# Patient Record
Sex: Male | Born: 1960 | Race: White | Hispanic: No | Marital: Married | Smoking: Former smoker
Health system: Southern US, Community
[De-identification: ages and names within clinical notes are randomized; demographics above are authoritative.]

## PROBLEM LIST (undated history)

## (undated) DIAGNOSIS — E785 Hyperlipidemia, unspecified: Secondary | ICD-10-CM

## (undated) DIAGNOSIS — K219 Gastro-esophageal reflux disease without esophagitis: Secondary | ICD-10-CM

## (undated) DIAGNOSIS — I10 Essential (primary) hypertension: Secondary | ICD-10-CM

## (undated) DIAGNOSIS — I2510 Atherosclerotic heart disease of native coronary artery without angina pectoris: Secondary | ICD-10-CM

## (undated) HISTORY — DX: Essential (primary) hypertension: I10

## (undated) HISTORY — DX: Gastro-esophageal reflux disease without esophagitis: K21.9

## (undated) HISTORY — DX: Hyperlipidemia, unspecified: E78.5

## (undated) HISTORY — DX: Atherosclerotic heart disease of native coronary artery without angina pectoris: I25.10

---

## 1998-09-13 HISTORY — PX: VASECTOMY: SHX75

## 2005-07-19 DIAGNOSIS — M545 Low back pain, unspecified: Secondary | ICD-10-CM | POA: Insufficient documentation

## 2010-01-23 DIAGNOSIS — R5381 Other malaise: Secondary | ICD-10-CM | POA: Insufficient documentation

## 2014-05-17 ENCOUNTER — Ambulatory Visit: Payer: Self-pay | Admitting: Obstetrics and Gynecology

## 2014-05-27 ENCOUNTER — Ambulatory Visit: Payer: Self-pay | Admitting: Family Medicine

## 2014-07-04 ENCOUNTER — Ambulatory Visit: Payer: Self-pay | Admitting: Family Medicine

## 2014-07-15 ENCOUNTER — Ambulatory Visit: Payer: Self-pay | Admitting: Family Medicine

## 2014-09-17 ENCOUNTER — Ambulatory Visit: Payer: Managed Care, Other (non HMO) | Admitting: Cardiovascular Disease

## 2014-09-17 ENCOUNTER — Ambulatory Visit (INDEPENDENT_AMBULATORY_CARE_PROVIDER_SITE_OTHER): Payer: Managed Care, Other (non HMO) | Admitting: Cardiovascular Disease

## 2014-09-17 ENCOUNTER — Encounter: Payer: Self-pay | Admitting: Cardiovascular Disease

## 2014-09-17 VITALS — BP 152/106 | HR 70 | Ht 69.0 in | Wt 206.0 lb

## 2014-09-17 DIAGNOSIS — Z720 Tobacco use: Secondary | ICD-10-CM

## 2014-09-17 DIAGNOSIS — I209 Angina pectoris, unspecified: Secondary | ICD-10-CM

## 2014-09-17 DIAGNOSIS — R0789 Other chest pain: Secondary | ICD-10-CM

## 2014-09-17 DIAGNOSIS — M549 Dorsalgia, unspecified: Secondary | ICD-10-CM | POA: Insufficient documentation

## 2014-09-17 DIAGNOSIS — I159 Secondary hypertension, unspecified: Secondary | ICD-10-CM

## 2014-09-17 DIAGNOSIS — I10 Essential (primary) hypertension: Secondary | ICD-10-CM

## 2014-09-17 DIAGNOSIS — Z87891 Personal history of nicotine dependence: Secondary | ICD-10-CM | POA: Insufficient documentation

## 2014-09-17 NOTE — Assessment & Plan Note (Signed)
Blood pressures running high. He has not started his metoprolol succinate. Heart rate running in the 60s. Suggested he start one half pill initially, buy a blood pressure cuff and monitor his blood pressure at home. To avoid bradycardia, may need alternate medication such as amlodipine, losartan or lisinopril, possibly even a combination pill with HCTZ. Suggested he call our office back in one week time with blood pressure measurements for further medication adjustment.

## 2014-09-17 NOTE — Assessment & Plan Note (Signed)
He stopped smoking many years ago. Only a remote smoking history

## 2014-09-17 NOTE — Assessment & Plan Note (Signed)
He reports symptoms have significantly improved.

## 2014-09-17 NOTE — Patient Instructions (Addendum)
[facility] MYOVIEW  Your caregiver has ordered a Stress Test with nuclear imaging. The purpose of this test is to evaluate the blood supply to your heart muscle. This procedure is referred to as a "Non-Invasive Stress Test." This is because other than having an IV started in your vein, nothing is inserted or "invades" your body. Cardiac stress tests are done to find areas of poor blood flow to the heart by determining the extent of coronary artery disease (CAD). Some patients exercise on a treadmill, which naturally increases the blood flow to your heart, while others who are  unable to walk on a treadmill due to physical limitations have a pharmacologic/chemical stress agent called Lexiscan . This medicine will mimic walking on a treadmill by temporarily increasing your coronary blood flow.   Please note: these test may take anywhere between 2-4 hours to complete  PLEASE REPORT TO [facility] : [facility]  THE VOLUNTEERS AT THE FIRST DESK WILL DIRECT YOU WHERE TO GO  Date of Procedure:________Friday, January 22_______  Arrival Time for Procedure:______10:15 am___________  Instructions regarding medication:   __X__:  Hold betablocker(s) night before procedure and morning of procedure:  METOPROLOL   PLEASE NOTIFY THE OFFICE AT LEAST 24 HOURS IN ADVANCE IF YOU ARE UNABLE TO KEEP YOUR APPOINTMENT.  [phone] AND  PLEASE NOTIFY [facility] AT [facility] AT LEAST 24 HOURS IN ADVANCE IF YOU ARE UNABLE TO KEEP YOUR APPOINTMENT. [phone]  How to prepare for your Myoview test:  1. Do not eat or drink after midnight 2. No caffeine for 24 hours prior to test 3. No smoking 24 hours prior to test. 4. Your medication may be taken with water.  If your doctor stopped a medication because of this test, do not take that medication. 5. Ladies, please do not wear dresses.  Skirts or pants are appropriate. Please wear a short sleeve shirt. 6. No perfume, cologne or lotion. 7. Wear comfortable walking  shoes. No heels!

## 2014-09-17 NOTE — Procedures (Signed)
Treadmill ordered for recent episodes of chest pain.  Resting EKG shows NSR with rate of 78 bpm, nonspecific ST abnormality in the anterolateral leads Resting blood pressure of 170/114 Stand bruce protocal was used.  Patient exercised for 9 min 29 sec,  Peak heart rate of 153 bpm.  This was 91% of the maximum predicted heart rate (167 bpm) Achieved 10.8 METS No symptoms of chest pain or lightheadedness were reported at peak stress or in recovery.  He did have chest pain at stage II Peak Blood pressure recorded was 191/116. Heart rate at 3 minutes in recovery was 95 bpm.  FINAL IMPRESSION: Equivocal ST changes inferior leads, anterolateral leads  Excellent exercise tolerance. He did develop chest pain in stage II, throughout the test concerning for angina. Severe hypertension at rest and through the stress test Given these findings, treadmill Myoview has been ordered

## 2014-09-17 NOTE — Patient Instructions (Signed)
You are doing well. Blood pressure is high today  We will schedule routine treadmill study for chest pain  Please buy a blood pressure cuff and track blood pressure at home Start metoprolol 1/2 pill daily  Please call us if you have new issues that need to be addressed before your next appt.  Your physician wants you to follow-up in: 1 month

## 2014-09-17 NOTE — Progress Notes (Signed)
Patient ID: Jeff ChapelMark R Fields, male    DOB: 12/04/1960, 54 y.o.   MRN: [id number]  HPI Comments: Jeff Fields is a 54 year old gentleman with remote smoking history who presents by referral from Dr. Sullivan LoneGilbert for chest pain on exertion.  He reports that he has had significant stress recently. Father diagnosed with testicular cancer requiring treatment. He found a lump on his testicle and after significant workup, this was found to be a benign finding Previously never had blood pressure issues, now blood pressure has been running high in the doctor's offices. He does not have a blood pressure cuff himself and does not know what his blood pressure runs at home.  Recently developed low back pain. This seems to have improved somewhat  Over the past several weeks, he has developed chest pain. He has pain onset with exertion, typically lasting 30 seconds. With rest, symptoms resolve. Denies having any symptoms typically at rest. Because of this he has not been working out as he typically does. Weight has been trending upwards He attributes the blood pressure rise on his weight gain No PND or orthopnea  EKG on today's visit shows normal sinus rhythm with rate 74 bpm, nonspecific ST abnormality   No Known Allergies  Outpatient Encounter Prescriptions as of 09/17/2014  Medication Sig  . aspirin 81 MG tablet Take 81 mg by mouth daily.  . metoprolol succinate (TOPROL-XL) 50 MG 24 hr tablet Take 50 mg by mouth daily. Take with or immediately following a meal.    Past Medical History  Diagnosis Date  . Hypertension     History reviewed. No pertinent past surgical history.  Social History  reports that he has never smoked. He does not have any smokeless tobacco history on file. He reports that he drinks alcohol. He reports that he does not use illicit drugs.  Family History family history includes Arrhythmia in his father; Hypertension in his brother and father.  Father recently diagnosed with testicular  cancer   Review of Systems  Constitutional: Negative.   Respiratory: Positive for chest tightness.   Cardiovascular: Positive for chest pain.  Gastrointestinal: Negative.   Musculoskeletal: Negative.   Neurological: Negative.   Hematological: Negative.   Psychiatric/Behavioral: Negative.     BP 152/106 mmHg  Pulse 70  Ht 5\' 9"  (1.753 m)  Wt 206 lb (93.441 kg)  BMI 30.41 kg/m2  Physical Exam  Constitutional: He is oriented to person, place, and time. He appears well-developed and well-nourished.  HENT:  Head: Normocephalic.  Nose: Nose normal.  Mouth/Throat: Oropharynx is clear and moist.  Eyes: Conjunctivae are normal. Pupils are equal, round, and reactive to light.  Neck: Normal range of motion. Neck supple. No JVD present.  Cardiovascular: Normal rate, regular rhythm, S1 normal, S2 normal, normal heart sounds and intact distal pulses.  Exam reveals no gallop and no friction rub.   No murmur heard. Pulmonary/Chest: Effort normal and breath sounds normal. No respiratory distress. He has no wheezes. He has no rales. He exhibits no tenderness.  Abdominal: Soft. Bowel sounds are normal. He exhibits no distension. There is no tenderness.  Musculoskeletal: Normal range of motion. He exhibits no edema or tenderness.  Lymphadenopathy:    He has no cervical adenopathy.  Neurological: He is alert and oriented to person, place, and time. Coordination normal.  Skin: Skin is warm and dry. No rash noted. No erythema.  Psychiatric: He has a normal mood and affect. His behavior is normal. Judgment and thought content normal.  Assessment and Plan   Nursing note and vitals reviewed.

## 2014-09-17 NOTE — Assessment & Plan Note (Signed)
Etiology of his chest pain is unclear. He has few cardiac risk factors, no family history. Only remote smoking history. Symptoms are concerning given his chest pain with exertion.  Uncertain if this is secondary to severe hypertension. Routine treadmill study has been ordered in the office today. If this shows abnormal EKG with exertion or if he has chest pain, we would order a Myoview.

## 2014-09-20 LAB — LIPID PANEL
CHOLESTEROL: 191 mg/dL (ref 0–200)
HDL: 31 mg/dL — AB (ref 35–70)
Triglycerides: 148 mg/dL (ref 40–160)

## 2014-09-20 LAB — BASIC METABOLIC PANEL
BUN: 14 mg/dL (ref 4–21)
CREATININE: 1.4 mg/dL — AB (ref 0.6–1.3)
GLUCOSE: 111 mg/dL

## 2014-09-21 ENCOUNTER — Encounter: Payer: Self-pay | Admitting: Cardiovascular Disease

## 2014-09-25 ENCOUNTER — Telehealth: Payer: Self-pay

## 2014-09-25 NOTE — Telephone Encounter (Signed)
Pt states his BP is not dropping any with the medication he is on. Please call and advise if he needs a med Change.

## 2014-09-25 NOTE — Telephone Encounter (Signed)
Pt sent MyChart message w/ recent BP readings.  He is sched to see Dr. Mariah MillingGollan on 10/25/14 for 1 month f/u, but would like to know if there are any changes that can be made before then.

## 2014-09-27 NOTE — Telephone Encounter (Signed)
Ideally need blood pressure measurements Would start amlodipine 10 mg daily Continue to monitor blood pressure with home blood pressure reading

## 2014-09-30 MED ORDER — AMLODIPINE BESYLATE 10 MG PO TABS: 10.0000 mg | ORAL_TABLET | Freq: Every day | ORAL | Status: DC

## 2014-09-30 NOTE — Telephone Encounter (Signed)
Pt sent message via MyChart notifying him of Dr. Windell HummingbirdGollan's recommendation.  Asked him to call back w/ any questions or concerns.

## 2014-10-25 ENCOUNTER — Telehealth: Payer: Self-pay

## 2014-10-25 ENCOUNTER — Ambulatory Visit: Payer: Managed Care, Other (non HMO) | Admitting: Cardiovascular Disease

## 2014-10-25 ENCOUNTER — Ambulatory Visit: Payer: Self-pay | Admitting: Cardiovascular Disease

## 2014-10-25 DIAGNOSIS — R079 Chest pain, unspecified: Secondary | ICD-10-CM

## 2014-10-25 NOTE — Telephone Encounter (Signed)
Pt called back, states he does want to proceed with the cath on the 26th. This encounter was created in error - please disregard.

## 2014-10-25 NOTE — Telephone Encounter (Signed)
Pt would like to tentatively sched cath for 11/08/14, but keep appt on 2/22 to discuss w/ Dr. Mariah MillingGollan. Advised him that we can get precath labs at that time.

## 2014-10-25 NOTE — Telephone Encounter (Signed)
Spoke w/ pt.  Advised him that Dr. Kirke CorinArida read his Celine Ahrmyoview and it was found to be abnormal.  Per Dr. Mariah MillingGollan, try to sched cath for 2/26.  Advised pt that I have limited information to provide him at this time, as the results are not yet in our system.  Pt has many questions regarding the results, what the cath entails and what other possible steps would be, as he states that his chest pain has subsided. Pt is sched to see Dr. Mariah MillingGollan on 11/04/14 and would feel more comfortable keeping to appt to discuss before scheduling cath.  Asked pt to call back w/ any further questions or concerns, or if he changes his mind and would like to go ahead and schedule cath.

## 2014-10-28 ENCOUNTER — Other Ambulatory Visit: Payer: Self-pay

## 2014-10-28 DIAGNOSIS — R0789 Other chest pain: Secondary | ICD-10-CM

## 2014-10-28 DIAGNOSIS — I159 Secondary hypertension, unspecified: Secondary | ICD-10-CM

## 2014-11-04 ENCOUNTER — Encounter: Payer: Self-pay | Admitting: Cardiovascular Disease

## 2014-11-04 ENCOUNTER — Ambulatory Visit (INDEPENDENT_AMBULATORY_CARE_PROVIDER_SITE_OTHER): Payer: Managed Care, Other (non HMO) | Admitting: Cardiovascular Disease

## 2014-11-04 VITALS — BP 128/80 | HR 72 | Ht 69.0 in | Wt 192.2 lb

## 2014-11-04 DIAGNOSIS — Z01810 Encounter for preprocedural cardiovascular examination: Secondary | ICD-10-CM

## 2014-11-04 DIAGNOSIS — I10 Essential (primary) hypertension: Secondary | ICD-10-CM

## 2014-11-04 DIAGNOSIS — Z720 Tobacco use: Secondary | ICD-10-CM

## 2014-11-04 DIAGNOSIS — Z87891 Personal history of nicotine dependence: Secondary | ICD-10-CM

## 2014-11-04 DIAGNOSIS — I209 Angina pectoris, unspecified: Secondary | ICD-10-CM

## 2014-11-04 NOTE — Assessment & Plan Note (Signed)
We have encouraged him to continue to work on weaning his cigarettes and smoking cessation. He will continue to work on this and does not want any assistance with chantix.  

## 2014-11-04 NOTE — Assessment & Plan Note (Signed)
We will schedule him for cardiac catheterization 11/08/2014. Risk and benefit discussed with him including significant bruising, heart attack, stroke. Cardiac stress test images shown to him No medication changes at this time

## 2014-11-04 NOTE — Patient Instructions (Addendum)
You are doing well. No medication changes were made.  Please call us if you have new issues that need to be addressed before your next appt.  Your physician wants you to follow-up in: 3 weeks      [facility] Cardiac Cath Instructions   You are scheduled for a Cardiac Cath on:_____Friday, Feb 26_________  Please arrive at __7:30 am__ on the day of your procedure  You will need to pre-register prior to the day of your procedure.  [facility].  Registration is the first desk on your right.  Please take the procedure order we have given you in order to be registered appropriately  Do not eat/drink anything after midnight  Someone will need to drive you home  It is recommended someone be with you for the first 24 hours after your procedure  Wear clothes that are easy to get on/off and wear slip on shoes if possible   Medications bring a current list of all medications with you  ___ You may take all of your medications the morning of your procedure with enough water to swallow safely  ___ Do not take these medications before your procedure:__________________________________________   Day of your procedure: Arrive at the Medical Mall entrance.  Free valet service is available.  After entering the Medical Mall please check-in at the registration desk (1st desk on your right) to receive your armband. After receiving your armband someone will escort you to the cardiac cath/special procedures waiting area.  The usual length of stay after your procedure is about 2 to 3 hours.  This can vary.  If you have any questions, please call our office at [phone], or you may call the cardiac cath lab at [facility] directly at [phone]

## 2014-11-04 NOTE — Progress Notes (Signed)
   Patient ID: Jeff Fields, male    DOB: 10/08/1960, 54 y.o.   MRN: [id number]  HPI Comments:  [name] is a 54 year old gentleman with remote smoking history, patient of Dr. Sullivan LoneGilbert, previously presenting  for chest pain on exertion, who follows up after recent stress test.  His stress test showed anterior wall ischemia seen in both non-attenuation corrected and attenuation corrected images. Wall motion abnormality to the anterior and anteroseptal wall  Images were shown to him in detail. Region of ischemia detail to him. He is interested in cardiac catheterization. He continues to have rare episodes of chest pain Previous note documented chest pain with exertion, relieved at rest  EKG dated the fourth 2016 showing no significant ST or T-wave changes  Other recent stressors  Father diagnosed with testicular cancer requiring treatment. Patient found a lump on his testicle and after significant workup, this was found to be a benign finding Recently developed low back pain.       No Known Allergies  Outpatient Encounter Prescriptions as of 11/04/2014  Medication Sig  . amLODipine (NORVASC) 10 MG tablet Take 1 tablet (10 mg total) by mouth daily.  Marland Kitchen. aspirin 81 MG tablet Take 81 mg by mouth daily.  . [DISCONTINUED] metoprolol succinate (TOPROL-XL) 50 MG 24 hr tablet Take 50 mg by mouth daily. Take with or immediately following a meal.    Past Medical History  Diagnosis Date  . Hypertension     History reviewed. No pertinent past surgical history.  Social History  reports that he has never smoked. He does not have any smokeless tobacco history on file. He reports that he drinks alcohol. He reports that he does not use illicit drugs.  Family History family history includes Arrhythmia in his father; Hypertension in his brother and father.   Review of Systems  Constitutional: Negative.   Respiratory: Negative.   Cardiovascular: Negative.   Gastrointestinal: Negative.    Musculoskeletal: Negative.   Skin: Negative.   Neurological: Negative.   Hematological: Negative.   Psychiatric/Behavioral: Negative.   All other systems reviewed and are negative.   BP 128/80 mmHg  Pulse 72  Ht 5\' 9"  (1.753 m)  Wt 192 lb 4 oz (87.204 kg)  BMI 28.38 kg/m2  Physical Exam  Constitutional: He is oriented to person, place, and time. He appears well-developed and well-nourished.  HENT:  Head: Normocephalic.  Nose: Nose normal.  Mouth/Throat: Oropharynx is clear and moist.  Eyes: Conjunctivae are normal. Pupils are equal, round, and reactive to light.  Neck: Normal range of motion. Neck supple. No JVD present.  Cardiovascular: Normal rate, regular rhythm, S1 normal, S2 normal, normal heart sounds and intact distal pulses.  Exam reveals no gallop and no friction rub.   No murmur heard. Pulmonary/Chest: Effort normal and breath sounds normal. No respiratory distress. He has no wheezes. He has no rales. He exhibits no tenderness.  Abdominal: Soft. Bowel sounds are normal. He exhibits no distension. There is no tenderness.  Musculoskeletal: Normal range of motion. He exhibits no edema or tenderness.  Lymphadenopathy:    He has no cervical adenopathy.  Neurological: He is alert and oriented to person, place, and time. Coordination normal.  Skin: Skin is warm and dry. No rash noted. No erythema.  Psychiatric: He has a normal mood and affect. His behavior is normal. Judgment and thought content normal.      Assessment and Plan   Nursing note and vitals reviewed.

## 2014-11-04 NOTE — Assessment & Plan Note (Signed)
Blood pressure is well controlled on today's visit. No changes made to the medications. 

## 2014-11-05 LAB — CBC WITH DIFFERENTIAL/PLATELET
Basophils Absolute: 0 10*3/uL (ref 0.0–0.2)
Basos: 0 %
EOS: 2 %
Eosinophils Absolute: 0.1 10*3/uL (ref 0.0–0.4)
HCT: 41.1 % (ref 37.5–51.0)
Hemoglobin: 14.3 g/dL (ref 12.6–17.7)
IMMATURE GRANS (ABS): 0 10*3/uL (ref 0.0–0.1)
IMMATURE GRANULOCYTES: 0 %
Lymphocytes Absolute: 1.7 10*3/uL (ref 0.7–3.1)
Lymphs: 35 %
MCH: 31.7 pg (ref 26.6–33.0)
MCHC: 34.8 g/dL (ref 31.5–35.7)
MCV: 91 fL (ref 79–97)
MONOCYTES: 8 %
Monocytes Absolute: 0.4 10*3/uL (ref 0.1–0.9)
NEUTROS PCT: 55 %
Neutrophils Absolute: 2.6 10*3/uL (ref 1.4–7.0)
Platelets: 239 10*3/uL (ref 150–379)
RBC: 4.51 x10E6/uL (ref 4.14–5.80)
RDW: 13.6 % (ref 12.3–15.4)
WBC: 4.8 10*3/uL (ref 3.4–10.8)

## 2014-11-05 LAB — BASIC METABOLIC PANEL
BUN / CREAT RATIO: 15 (ref 9–20)
BUN: 18 mg/dL (ref 6–24)
CO2: 22 mmol/L (ref 18–29)
Calcium: 9.5 mg/dL (ref 8.7–10.2)
Chloride: 104 mmol/L (ref 97–108)
Creatinine, Ser: 1.17 mg/dL (ref 0.76–1.27)
GFR calc non Af Amer: 71 mL/min/{1.73_m2} (ref >59)
GFR, EST AFRICAN AMERICAN: 82 mL/min/{1.73_m2} (ref >59)
Glucose: 94 mg/dL (ref 65–99)
POTASSIUM: 5 mmol/L (ref 3.5–5.2)
Sodium: 141 mmol/L (ref 134–144)

## 2014-11-05 LAB — PROTIME-INR
INR: 1.1 (ref 0.8–1.2)
Prothrombin Time: 11.2 s (ref 9.1–12.0)

## 2014-11-06 ENCOUNTER — Telehealth: Payer: Self-pay | Admitting: Cardiovascular Disease

## 2014-11-06 ENCOUNTER — Ambulatory Visit: Payer: Self-pay | Admitting: Cardiovascular Disease

## 2014-11-06 NOTE — Telephone Encounter (Signed)
His paperwork is on your desk.  He would like this completed before his cath on Friday.

## 2014-11-06 NOTE — Telephone Encounter (Signed)
Patient needs FMLA papers by Friday morning.  Please DO NOT SEND TO [place].    Please call patient when ready.

## 2014-11-07 NOTE — Telephone Encounter (Signed)
Spoke w/ pt.  Advised him that paperwork would need to be completed after his cath on Friday.  He is agreeable to this an appreciative.

## 2014-11-08 ENCOUNTER — Other Ambulatory Visit: Payer: Self-pay

## 2014-11-08 ENCOUNTER — Encounter: Payer: Self-pay | Admitting: Cardiovascular Disease

## 2014-11-08 ENCOUNTER — Ambulatory Visit: Payer: Self-pay | Admitting: Cardiovascular Disease

## 2014-11-08 DIAGNOSIS — Z01810 Encounter for preprocedural cardiovascular examination: Secondary | ICD-10-CM

## 2014-11-08 DIAGNOSIS — I209 Angina pectoris, unspecified: Secondary | ICD-10-CM

## 2014-11-08 DIAGNOSIS — I2510 Atherosclerotic heart disease of native coronary artery without angina pectoris: Secondary | ICD-10-CM

## 2014-11-08 HISTORY — PX: CORONARY ANGIOPLASTY WITH STENT PLACEMENT: SHX49

## 2014-11-08 HISTORY — PX: CARDIAC CATHETERIZATION: SHX172

## 2014-11-09 ENCOUNTER — Other Ambulatory Visit: Payer: Self-pay | Admitting: Cardiovascular Disease

## 2014-11-09 DIAGNOSIS — R079 Chest pain, unspecified: Secondary | ICD-10-CM

## 2014-11-09 MED ORDER — CLOPIDOGREL BISULFATE 75 MG PO TABS: 75.0000 mg | ORAL_TABLET | Freq: Every day | ORAL | Status: DC

## 2014-11-09 MED ORDER — ATORVASTATIN CALCIUM 40 MG PO TABS: 40.0000 mg | ORAL_TABLET | Freq: Every day | ORAL | Status: DC

## 2014-11-09 MED ORDER — METOPROLOL SUCCINATE ER 25 MG PO TB24: 25.0000 mg | ORAL_TABLET | Freq: Every day | ORAL | Status: DC

## 2014-11-11 ENCOUNTER — Encounter: Payer: Self-pay | Admitting: Cardiovascular Disease

## 2014-11-11 ENCOUNTER — Telehealth: Payer: Self-pay | Admitting: Cardiovascular Disease

## 2014-11-11 NOTE — Telephone Encounter (Signed)
Pt is calling reminding us to do the FLMA today   Please fax to : Tessie [employer] [phone]  [facility]

## 2014-11-11 NOTE — Telephone Encounter (Signed)
Pt is calling again, knows we are busy and would like us to know payroll is being done this morning, if we could sent in the paper. Please call patient when done.

## 2014-11-11 NOTE — Telephone Encounter (Signed)
Spoke w/ pt.  Advised him that completed paperwork has been faxed.  He is appreciative and will call back w/ any questions or concerns.

## 2014-11-12 ENCOUNTER — Telehealth: Payer: Self-pay | Admitting: *Deleted

## 2014-11-12 NOTE — Telephone Encounter (Signed)
Pt needs a letter for work when he was out  For Friday 11/08/14 and Monday 11/11/14 and today 11/12/14  Patient needs this done today, for he is going to work tmrw.  He can come by and pick this up.   Also in letter can we put that it is "light duty"  He wants to know how long he should be on that.  Please advise.

## 2014-11-12 NOTE — Telephone Encounter (Signed)
Okay to write a work note At this point he does not need light duty, can resume full duty unless he would like to do light duty through the end of this week, regular duty next week That is okay

## 2014-11-12 NOTE — Telephone Encounter (Signed)
Spoke w/ pt.  Advised him that I am leaving letter at the front desk for him to pick up at his convenience, along w/ the originals of the [facility] paperwork that we faxed for him. He is appreciative and will call back w/ any questions or concerns.

## 2014-11-13 NOTE — Telephone Encounter (Signed)
This encounter was created in error - please disregard.

## 2014-11-25 ENCOUNTER — Telehealth: Payer: Self-pay | Admitting: *Deleted

## 2014-11-25 NOTE — Telephone Encounter (Signed)
Patient can't tolerate the atorvastatin causes muscle and joint aches. Please advise what other medication he could take.

## 2014-11-25 NOTE — Telephone Encounter (Signed)
Pt called having question On his cholesterol medication He can not take it anymore for it Enhances his arthritis.  Did not take anything yesterday  Would like a generic version or something else to replace it. Please call patient and let him know.  And if we could send it in to [address] [facility],

## 2014-11-26 NOTE — Telephone Encounter (Signed)
Generic of Lipitor is atorvastatin. Sometimes different pharmacy will provide a better price. He is welcome to try a different generic such as simvastatin 40 mg daily Would start one half pill for several weeks before titrating upwards Repeat cholesterol in 3 months time Goal LDL less than 70

## 2014-11-27 ENCOUNTER — Ambulatory Visit (INDEPENDENT_AMBULATORY_CARE_PROVIDER_SITE_OTHER): Payer: Managed Care, Other (non HMO) | Admitting: Cardiovascular Disease

## 2014-11-27 ENCOUNTER — Encounter: Payer: Self-pay | Admitting: Cardiovascular Disease

## 2014-11-27 ENCOUNTER — Ambulatory Visit: Payer: Managed Care, Other (non HMO) | Admitting: Cardiovascular Disease

## 2014-11-27 VITALS — BP 112/78 | HR 61 | Ht 69.0 in | Wt 185.5 lb

## 2014-11-27 DIAGNOSIS — Z720 Tobacco use: Secondary | ICD-10-CM

## 2014-11-27 DIAGNOSIS — I10 Essential (primary) hypertension: Secondary | ICD-10-CM

## 2014-11-27 DIAGNOSIS — E785 Hyperlipidemia, unspecified: Secondary | ICD-10-CM | POA: Insufficient documentation

## 2014-11-27 DIAGNOSIS — Z955 Presence of coronary angioplasty implant and graft: Secondary | ICD-10-CM | POA: Insufficient documentation

## 2014-11-27 DIAGNOSIS — I2510 Atherosclerotic heart disease of native coronary artery without angina pectoris: Secondary | ICD-10-CM | POA: Insufficient documentation

## 2014-11-27 DIAGNOSIS — I25119 Atherosclerotic heart disease of native coronary artery with unspecified angina pectoris: Secondary | ICD-10-CM

## 2014-11-27 DIAGNOSIS — Z87891 Personal history of nicotine dependence: Secondary | ICD-10-CM

## 2014-11-27 MED ORDER — CLOPIDOGREL BISULFATE 75 MG PO TABS: 75.0000 mg | ORAL_TABLET | Freq: Every day | ORAL | Status: DC

## 2014-11-27 MED ORDER — LOVASTATIN 20 MG PO TABS: 20.0000 mg | ORAL_TABLET | Freq: Every day | ORAL | Status: DC

## 2014-11-27 MED ORDER — METOPROLOL TARTRATE 25 MG PO TABS: 25.0000 mg | ORAL_TABLET | Freq: Two times a day (BID) | ORAL | Status: DC

## 2014-11-27 MED ORDER — AMLODIPINE BESYLATE 10 MG PO TABS: 10.0000 mg | ORAL_TABLET | Freq: Every day | ORAL | Status: DC

## 2014-11-27 NOTE — Patient Instructions (Signed)
You are doing well.  Please cut the amlodipine in 1/2 daily (5 mg daily)  Continue to hold the atorvastatin Try Co Enz Q10 for muscle ache  Try lovastatin 1/2 pill once a day for cholesterol Wait a few weeks, if no side effects, increase up to a full pill  We will change the metoprolol succinate XL to metoprolol tartrate (twice a day pill), 25 mg twice a day Monitor your heart rate,  if it runs low (always in the 50s), cut the metoprolol in 1/2 twice a day  Continue asa and plavix  Please call us if you have new issues that need to be addressed before your next appt.  Your physician wants you to follow-up in: 6 months.  You will receive a reminder letter in the mail two months in advance. If you don't receive a letter, please call our office to schedule the follow-up appointment.

## 2014-11-27 NOTE — Assessment & Plan Note (Signed)
In addition to the LAD lesion, there was 40% proximal LAD disease, 40% diagonal disease We'll continue aggressive cholesterol management, smoking cessation.

## 2014-11-27 NOTE — Telephone Encounter (Signed)
Left message for pt to call back  °

## 2014-11-27 NOTE — Assessment & Plan Note (Signed)
Cardiac catheterization showing 99% mid LAD disease after the diagonal branch. Xience 3.50 x 18 mm stent placed, DES We'll continue aspirin and Plavix

## 2014-11-27 NOTE — Progress Notes (Signed)
Patient ID: Jeff Fields, male    DOB: 1960-10-03, 54 y.o.   MRN: [id number]  HPI Comments:  Jeff Fields is a 54 year old gentleman with remote smoking history, patient of Dr. Sullivan Lone, initially presenting with chest pain on exertion,   stress test that showed anterior wall ischemia, Follows up today after recent cardiac catheterization and stent placement to his mid LAD.  Since his discharge from the hospital, he has had severe muscle and arthritic pain which she attributes to the Lipitor. He has stopped the medication 3 days ago and feels somewhat better though still has symptoms. Blood pressure has been running low on amlodipine 10 mg daily and metoprolol succinate 25 mill grams daily. Heart rate 50s to 60s Denies having any further chest pain Tolerating aspirin and Plavix He wonders if he could change the metoprolol succinate to the [employer] generic  EKG on today's visit shows normal sinus rhythm with rate 61 bpm, no significant ST or T-wave changes  Past medical history His stress test showed anterior wall ischemia seen in both non-attenuation corrected and attenuation corrected images. Wall motion abnormality to the anterior and anteroseptal wall  Other recent stressors  Father diagnosed with testicular cancer requiring treatment. Patient found a lump on his testicle and after significant workup, this was found to be a benign finding Recently developed low back pain.       No Known Allergies  Outpatient Encounter Prescriptions as of 53/16/2016  Medication Sig  . amLODipine (NORVASC) 10 MG tablet Take 1 tablet (10 mg total) by mouth daily.  Marland Kitchen aspirin 81 MG tablet Take 81 mg by mouth daily.  . clopidogrel (PLAVIX) 75 MG tablet Take 1 tablet (75 mg total) by mouth daily.  . [DISCONTINUED] amLODipine (NORVASC) 10 MG tablet Take 1 tablet (10 mg total) by mouth daily.  . [DISCONTINUED] atorvastatin (LIPITOR) 40 MG tablet Take 1 tablet (40 mg total) by mouth daily.  . [DISCONTINUED]  clopidogrel (PLAVIX) 75 MG tablet Take 1 tablet (75 mg total) by mouth daily.  . [DISCONTINUED] metoprolol succinate (TOPROL XL) 25 MG 24 hr tablet Take 1 tablet (25 mg total) by mouth daily.  Marland Kitchen lovastatin (MEVACOR) 20 MG tablet Take 1 tablet (20 mg total) by mouth at bedtime.  . metoprolol tartrate (LOPRESSOR) 25 MG tablet Take 1 tablet (25 mg total) by mouth 2 (two) times daily.    Past Medical History  Diagnosis Date  . Hypertension     Past Surgical History  Procedure Laterality Date  . Cardiac catheterization  11/08/2014  . Coronary angioplasty with stent placement  11/08/2014    drug eluting stent placement to the mid LAD    Social History  reports that he has never smoked. He does not have any smokeless tobacco history on file. He reports that he drinks alcohol. He reports that he does not use illicit drugs.  Family History family history includes Arrhythmia in his father; Hypertension in his brother and father.   Review of Systems  Constitutional: Negative.   Respiratory: Negative.   Cardiovascular: Negative.   Gastrointestinal: Negative.   Musculoskeletal: Negative.   Skin: Negative.   Neurological: Negative.   Hematological: Negative.   Psychiatric/Behavioral: Negative.   All other systems reviewed and are negative.   BP 112/78 mmHg  Pulse 61  Ht  (1.753 m)  Wt 185 lb 8 oz (84.142 kg)  BMI 27.38 kg/m2  Physical Exam  Constitutional: He is oriented to person, place, and time. He appears well-developed and  well-nourished.  HENT:  Head: Normocephalic.  Nose: Nose normal.  Mouth/Throat: Oropharynx is clear and moist.  Eyes: Conjunctivae are normal. Pupils are equal, round, and reactive to light.  Neck: Normal range of motion. Neck supple. No JVD present.  Cardiovascular: Normal rate, regular rhythm, S1 normal, S2 normal, normal heart sounds and intact distal pulses.  Exam reveals no gallop and no friction rub.   No murmur heard. Pulmonary/Chest: Effort  normal and breath sounds normal. No respiratory distress. He has no wheezes. He has no rales. He exhibits no tenderness.  Abdominal: Soft. Bowel sounds are normal. He exhibits no distension. There is no tenderness.  Musculoskeletal: Normal range of motion. He exhibits no edema or tenderness.  Lymphadenopathy:    He has no cervical adenopathy.  Neurological: He is alert and oriented to person, place, and time. Coordination normal.  Skin: Skin is warm and dry. No rash noted. No erythema.  Psychiatric: He has a normal mood and affect. His behavior is normal. Judgment and thought content normal.      Assessment and Plan   Nursing note and vitals reviewed.

## 2014-11-27 NOTE — Assessment & Plan Note (Signed)
Will hold the atorvastatin, consider trying low-dose lovastatin 10 mg with titration up to 20 mg if tolerated Check cholesterol in 3 months time, approximately June 2016

## 2014-11-27 NOTE — Telephone Encounter (Signed)
Pt has appt this afternoon to see Dr. Mariah MillingGollan.

## 2014-11-27 NOTE — Assessment & Plan Note (Signed)
We have encouraged him to continue to work on weaning his cigarettes and smoking cessation. He will continue to work on this and does not want any assistance with chantix.  

## 2014-11-27 NOTE — Assessment & Plan Note (Signed)
Blood pressures running low today. We'll decrease the amlodipine down to 5 mg daily. He prefers metoprolol tartrate 25 mg twice a day If heart rate runs low suggested he decrease the dose down to 12.5 mill grams twice a day

## 2014-12-10 ENCOUNTER — Telehealth: Payer: Self-pay | Admitting: *Deleted

## 2014-12-10 NOTE — Telephone Encounter (Signed)
Pt c/o medication issue:  1. Name of Medication: not sure which one it could be.   2. How are you currently taking this medication (dosage and times per day)? Is still taking all medications  3. Are you having a reaction (difficulty breathing--STAT)? No   4. What is your medication issue? He states he is having reactions , shoulder joins and on back. He is having throbbing pains there. He did not have this before, this all came after the procedure and medication.   Pt states he is at work, so if we call him we will try and answer it.

## 2014-12-10 NOTE — Telephone Encounter (Signed)
Spoke w/ pt.  He reports that he is still taking 1/2 lovastatin, but that his muscle pain never went away from the atorvastatin.  Discussed w/ pt that if sx were r/t meds, we would have him hold med until sx improved and if they don't improve, they most likely were not r/t med.  He states that his "friend had muscle pain from his blood pressure medicine.  I don't know the side effects of all meds and don't know which one is causing this, but something has got to be done".  Pt states that he has had myalgias "all over" since his cath and wants to know "what Dr. Mariah MillingGollan plans on doing to make this better". Advised pt that I will make Dr. Mariah MillingGollan aware and call him back w/ his recommendation.   Spoke w/ Dr. Mariah MillingGollan who suggests that pt stop his statin and monitor sx.  If he feels that sx are r/t another med, he can stop that med for a week or 2, monitor sx and let us know if sx improve or not.   Left detailed message on pt's vm w/ Dr. Windell HummingbirdGollan's recommendation.  Asked him to call back w/ any questions or concerns.

## 2014-12-10 NOTE — Telephone Encounter (Signed)
Left message for pt to call back  °

## 2014-12-11 NOTE — Telephone Encounter (Signed)
Pt is just making sure that in the list of medication he can stop, is Plavix on that list.  He is asking bc that is a blood thinner.  Please call and he also states we could lmov for patient won't be able to answer

## 2014-12-11 NOTE — Telephone Encounter (Signed)
Left detailed message on pt's vm advising him not to stop Plavix. Advised him that myalgias are a side effect of statin therapy, not most likely not r/t his other meds. Advised him that if he does hold the metoprolol or amlodipine, to be sure to closely monitor his BP & HR. Asked him to call back w/ any questions or concerns.

## 2014-12-11 NOTE — Telephone Encounter (Signed)
Left detailed message on pt's vm advising him not to stop Plavi

## 2014-12-13 ENCOUNTER — Telehealth: Payer: Self-pay | Admitting: *Deleted

## 2014-12-13 NOTE — Telephone Encounter (Signed)
Spoke w/ pt.  Advised him of Dr. Windell HummingbirdGollan's recommendation.  He states that he has been holding his statin b/c a friend of his at work told him to. He is currently holding his metoprolol, but has not noticed a difference yet. Pt verbalizes frustration and states that he has arthritis that he feels that his heart meds are exacerbating. Reports pain in his joints, shoulders and back since his meds were started.  Advised pt to speak w/ his PCP and has discussion about this . He is appreciative and states that he will lean more on his docs recommendations and less on his friend's.  He states that he will continue to monitor his sx and call back w/ any questions or concerns.

## 2014-12-13 NOTE — Telephone Encounter (Signed)
Pt is asking if we can send something to his pharmacy to help him sleep.  He can't go to work without sleep. He states he did not have this problem until he started these medication.  Please advise.

## 2014-12-13 NOTE — Telephone Encounter (Signed)
We need to figure out which medication is causing his side effects The prefer not to start a sleeping pill If he thinks he needs a sleeping pill, would talk with primary care otherwise would hold amlodipine and/or metoprolol and see which is causing his side effects  Would not stop aspirin, Plavix or statin

## 2014-12-17 ENCOUNTER — Telehealth: Payer: Self-pay | Admitting: *Deleted

## 2014-12-17 NOTE — Telephone Encounter (Signed)
Pt c/o medication issue:  1. Name of Medication: mobic generic version  2. How are you currently taking this medication (dosage and times per day)? Will be stopping it  3. Are you having a reaction (difficulty breathing--STAT)? no  4. What is your medication issue? Pt asking if he should stop the medication above. Pt is seeing his PCP tmrw, but would like to know from our side what we think

## 2014-12-17 NOTE — Telephone Encounter (Signed)
Would discuss with primary care We'll make is similar to Aleve or ibuprofen Used for musculoskeletal issues Typically we try to avoid NSAIDs if possible

## 2014-12-18 NOTE — Telephone Encounter (Signed)
[facility] for [facility] to [facility] for a message from Dr. Mariah MillingGollan

## 2014-12-18 NOTE — Telephone Encounter (Addendum)
Spoke with patient and reviewed Dr. Windell HummingbirdGollan's advice with him.  Patient verbalized understanding and agreement; states he will see his PCP today.  Patient asked if he can stop ASA and I advised that he cannot d/c ASA at this time due to recent stent placement. Patient verbalized understanding and agreement.

## 2014-12-19 ENCOUNTER — Ambulatory Visit
Admit: 2014-12-19 | Disposition: A | Discharge status: Home / Self Care | Payer: Self-pay | Attending: Family Medicine | Admitting: Family Medicine

## 2015-01-12 NOTE — Discharge Summary (Signed)
Dates of Admission and Diagnosis:  Date of Admission 08-Nov-2014   Date of Discharge 01-Jan-0001   Allergies:  No Known Allergies:   Hospital Course:  Condition on Discharge Good   Code Status:  Code Status Full Code   DISCHARGE INSTRUCTIONS HOME MEDS:  Medication Reconciliation: Patient's Home Medications at Discharge:     Medication Instructions  aspirin 81 mg oral tablet  1 tab(s) orally once a day   metoprolol succinate 25 mg oral tablet, extended release  1 tab(s) orally once a day   atorvastatin 40 mg oral tablet  1 tab(s) orally once a day   clopidogrel 75 mg oral tablet  1 tab(s) orally once a day   amlodipine 10 mg oral tablet  1 tab(s) orally once a day     Physician's Instructions:  Home Health? No   Diet Regular   Dietary Supplements None   Electronic Signatures: Flowers, Forensic psychologistMyra (RN)  (Signed 27-Feb-16 08:55)  Authored: ADMISSION DATE AND DIAGNOSIS, Allergies, HOSPITAL COURSE, DISCHARGE INSTRUCTIONS HOME MEDS, PATIENT INSTRUCTIONS   Last Updated: 27-Feb-16 08:55 by Majel HomerFlowers, Myra (RN)

## 2015-02-12 ENCOUNTER — Encounter: Payer: Self-pay | Admitting: Physician Assistant

## 2015-02-12 ENCOUNTER — Telehealth: Payer: Self-pay | Admitting: Family Medicine

## 2015-02-12 ENCOUNTER — Encounter: Payer: Self-pay | Admitting: Family Medicine

## 2015-02-12 ENCOUNTER — Ambulatory Visit (INDEPENDENT_AMBULATORY_CARE_PROVIDER_SITE_OTHER): Payer: Managed Care, Other (non HMO) | Admitting: Physician Assistant

## 2015-02-12 VITALS — BP 122/64 | HR 58 | Temp 98.4°F | Resp 16 | Ht 69.0 in | Wt 175.0 lb

## 2015-02-12 DIAGNOSIS — N509 Disorder of male genital organs, unspecified: Secondary | ICD-10-CM | POA: Insufficient documentation

## 2015-02-12 DIAGNOSIS — R109 Unspecified abdominal pain: Secondary | ICD-10-CM | POA: Insufficient documentation

## 2015-02-12 DIAGNOSIS — M62830 Muscle spasm of back: Secondary | ICD-10-CM | POA: Insufficient documentation

## 2015-02-12 DIAGNOSIS — R079 Chest pain, unspecified: Secondary | ICD-10-CM | POA: Insufficient documentation

## 2015-02-12 DIAGNOSIS — I10 Essential (primary) hypertension: Secondary | ICD-10-CM | POA: Insufficient documentation

## 2015-02-12 DIAGNOSIS — B309 Viral conjunctivitis, unspecified: Secondary | ICD-10-CM

## 2015-02-12 DIAGNOSIS — R0781 Pleurodynia: Secondary | ICD-10-CM | POA: Insufficient documentation

## 2015-02-12 DIAGNOSIS — F419 Anxiety disorder, unspecified: Secondary | ICD-10-CM | POA: Insufficient documentation

## 2015-02-12 DIAGNOSIS — R222 Localized swelling, mass and lump, trunk: Secondary | ICD-10-CM | POA: Insufficient documentation

## 2015-02-12 DIAGNOSIS — R030 Elevated blood-pressure reading, without diagnosis of hypertension: Secondary | ICD-10-CM | POA: Insufficient documentation

## 2015-02-12 DIAGNOSIS — S63509A Unspecified sprain of unspecified wrist, initial encounter: Secondary | ICD-10-CM | POA: Insufficient documentation

## 2015-02-12 DIAGNOSIS — G479 Sleep disorder, unspecified: Secondary | ICD-10-CM | POA: Insufficient documentation

## 2015-02-12 DIAGNOSIS — M546 Pain in thoracic spine: Secondary | ICD-10-CM | POA: Insufficient documentation

## 2015-02-12 NOTE — Patient Instructions (Signed)
Viral Conjunctivitis Conjunctivitis is an irritation (inflammation) of the clear membrane that covers the white part of the eye (the conjunctiva). The irritation can also happen on the underside of the eyelids. Conjunctivitis makes the eye red or pink in color. This is what is commonly known as pink eye. Viral conjunctivitis can spread easily (contagious). CAUSES   Infection from virus on the surface of the eye.  Infection from the irritation or injury of nearby tissues such as the eyelids or cornea.  More serious inflammation or infection on the inside of the eye.  Other eye diseases.  The use of certain eye medications. SYMPTOMS  The normally white color of the eye or the underside of the eyelid is usually pink or red in color. The pink eye is usually associated with irritation, tearing and some sensitivity to light. Viral conjunctivitis is often associated with a clear, watery discharge. If a discharge is present, there may also be some blurred vision in the affected eye. DIAGNOSIS  Conjunctivitis is diagnosed by an eye exam. The eye specialist looks for changes in the surface tissues of the eye which take on changes characteristic of the specific types of conjunctivitis. A sample of any discharge may be collected on a Q-Tip (sterile swap). The sample will be sent to a lab to see whether or not the inflammation is caused by bacterial or viral infection. TREATMENT  Viral conjunctivitis will not respond to medicines that kill germs (antibiotics). Treatment is aimed at stopping a bacterial infection on top of the viral infection. The goal of treatment is to relieve symptoms (such as itching) with antihistamine drops or other eye medications.  HOME CARE INSTRUCTIONS   To ease discomfort, apply a cool, clean wash cloth to your eye for 10 to 20 minutes, 3 to 4 times a day.  Gently wipe away any drainage from the eye with a warm, wet washcloth or a cotton ball.  Wash your hands often with soap  and use paper towels to dry.  Do not share towels or washcloths. This may spread the infection.  Change or wash your pillowcase every day.  You should not use eye make-up until the infection is gone.  Stop using contacts lenses. Ask your eye professional how to sterilize or replace them before using again. This depends on the type of contact lenses used.  Do not touch the edge of the eyelid with the eye drop bottle or ointment tube when applying medications to the affected eye. This will stop you from spreading the infection to the other eye or to others. SEEK IMMEDIATE MEDICAL CARE IF:   The infection has not improved within 3 days of beginning treatment.  A watery discharge from the eye develops.  Pain in the eye increases.  The redness is spreading.  Vision becomes blurred.  An oral temperature above 102 F (38.9 C) develops, or as your caregiver suggests.  Facial pain, redness or swelling develops.  Any problems that may be related to the prescribed medicine develop. MAKE SURE YOU:   Understand these instructions.  Will watch your condition.  Will get help right away if you are not doing well or get worse. Document Released: 08/30/2005 Document Revised: 11/22/2011 Document Reviewed: 04/18/2008 Exit[employer] Patient Information 2015 [employer]. This information is not intended to replace advice given to you by your health care provider. Make sure you discuss any questions you have with your health care provider.  

## 2015-02-12 NOTE — Progress Notes (Signed)
Subjective:     Patient ID: Jeff Fields, Jeff Fields   DOB: 07/04/1961, 54 y.o.   MRN: [id number]  Eye Pain  The right eye is affected. This is a new problem. The current episode started yesterday. The problem occurs constantly. The problem has been gradually worsening. The injury mechanism is unknown. The pain is at a severity of 4/10. The pain is moderate. There is no known exposure to pink eye. He wears contacts. Associated symptoms include blurred vision, an eye discharge, eye redness, itching and a recent URI. Pertinent negatives include no fever or foreign body sensation. He has tried eye drops for the symptoms. The treatment provided no relief.     Review of Systems  Constitutional: Negative for fever.  HENT: Positive for congestion, postnasal drip and sinus pressure.   Eyes: Positive for blurred vision, pain, discharge and redness.  Skin: Positive for itching.       Objective:   Physical Exam  Constitutional: He appears well-developed and well-nourished.  HENT:  Head: Normocephalic and atraumatic.  Right Ear: Tympanic membrane, external ear and ear canal normal.  Left Ear: Tympanic membrane, external ear and ear canal normal.  Nose: Rhinorrhea present. No sinus tenderness. Right sinus exhibits no maxillary sinus tenderness and no frontal sinus tenderness. Left sinus exhibits no maxillary sinus tenderness and no frontal sinus tenderness.  Mouth/Throat: Uvula is midline, oropharynx is clear and moist and mucous membranes are normal. No oropharyngeal exudate, posterior oropharyngeal edema or posterior oropharyngeal erythema.  Eyes: EOM are normal. Pupils are equal, round, and reactive to light. Right eye exhibits discharge (clear and copious). Right eye exhibits no chemosis, no exudate and no hordeolum. No foreign body present in the right eye. Left eye exhibits no chemosis, no discharge, no exudate and no hordeolum. No foreign body present in the left eye. Right conjunctiva is injected. Right  conjunctiva has no hemorrhage. Left conjunctiva is not injected. Left conjunctiva has no hemorrhage. No scleral icterus.  Fundoscopic exam:      The right eye shows red reflex.       The left eye shows red reflex.  Slit lamp exam:      The right eye shows no corneal abrasion, no corneal ulcer and no foreign body.       The left eye shows no corneal abrasion, no corneal ulcer and no foreign body.  Neck: Normal range of motion. Neck supple. No tracheal deviation present. No thyromegaly present.  Lymphadenopathy:    He has no cervical adenopathy.       Assessment:    Viral conjunctivitis     Plan:     Most likely viral in nature.  Advised to perform good hand hygiene especially after contact with the eye.  May use anti-histamine eye drops for redness and irritation.  Wash eye with either warm or cool compress swiping from inner corner to the outer corner.  Do not wear contact lenses until eye redness and symptoms subside.  He is to call the office if no improvement.

## 2015-02-12 NOTE — Telephone Encounter (Signed)
Patient needs OV. Can not treat over the phone. Thanks- Dr. Elease HashimotoMaloney

## 2015-02-12 NOTE — Telephone Encounter (Signed)
THIS IS A [facility] PT. PT THINKS HE HAS PINL EYE AND WOULD LIKE US TO SEND IN AND RX TO TREAT. PT STATED HIS RIGHT EYE IS SWOLLEN, DRAINING, AND ITCHY. PHARMACY: [facility] [address] THANKS TNP

## 2015-02-12 NOTE — Telephone Encounter (Signed)
[facility] Thanks tnp

## 2015-02-13 ENCOUNTER — Ambulatory Visit: Payer: Self-pay | Admitting: Physician Assistant

## 2015-05-10 IMAGING — CR DG WRIST COMPLETE 3+V*R*
1 series · 4 of 4 positions shown · non-contrast
Comparison: None.

CLINICAL DATA: Wrist pain, swelling for 1 week. No known injury.
Initial encounter.

EXAM:
RIGHT WRIST - COMPLETE 3+ VIEW

[Series 1: kdxr wrist rt comp with obliques · 0.14mm/px · 4 of 4 slices shown]
[im 1/4]
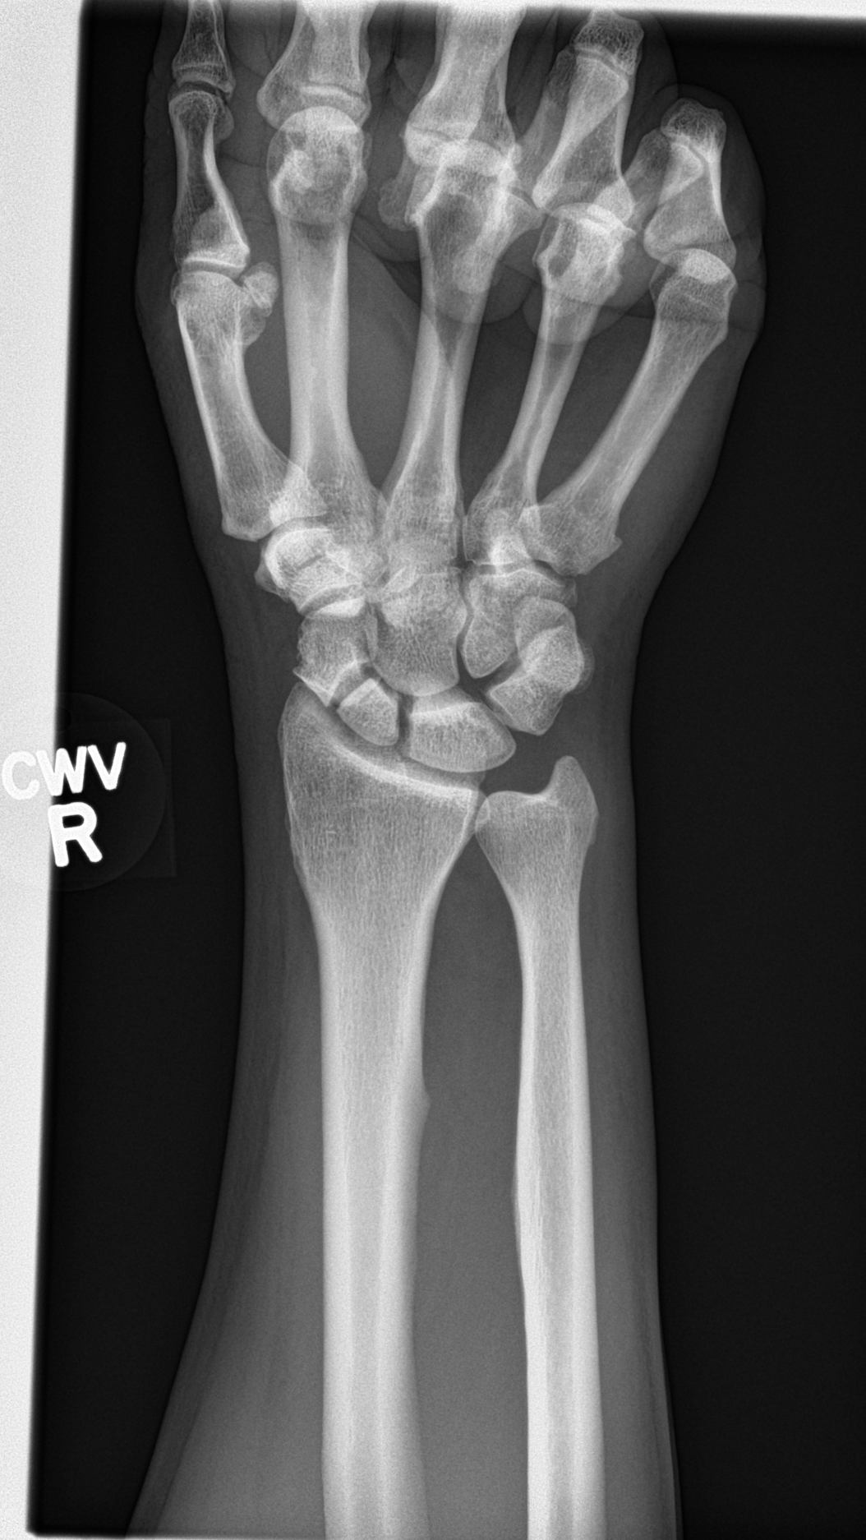
[im 2/4]
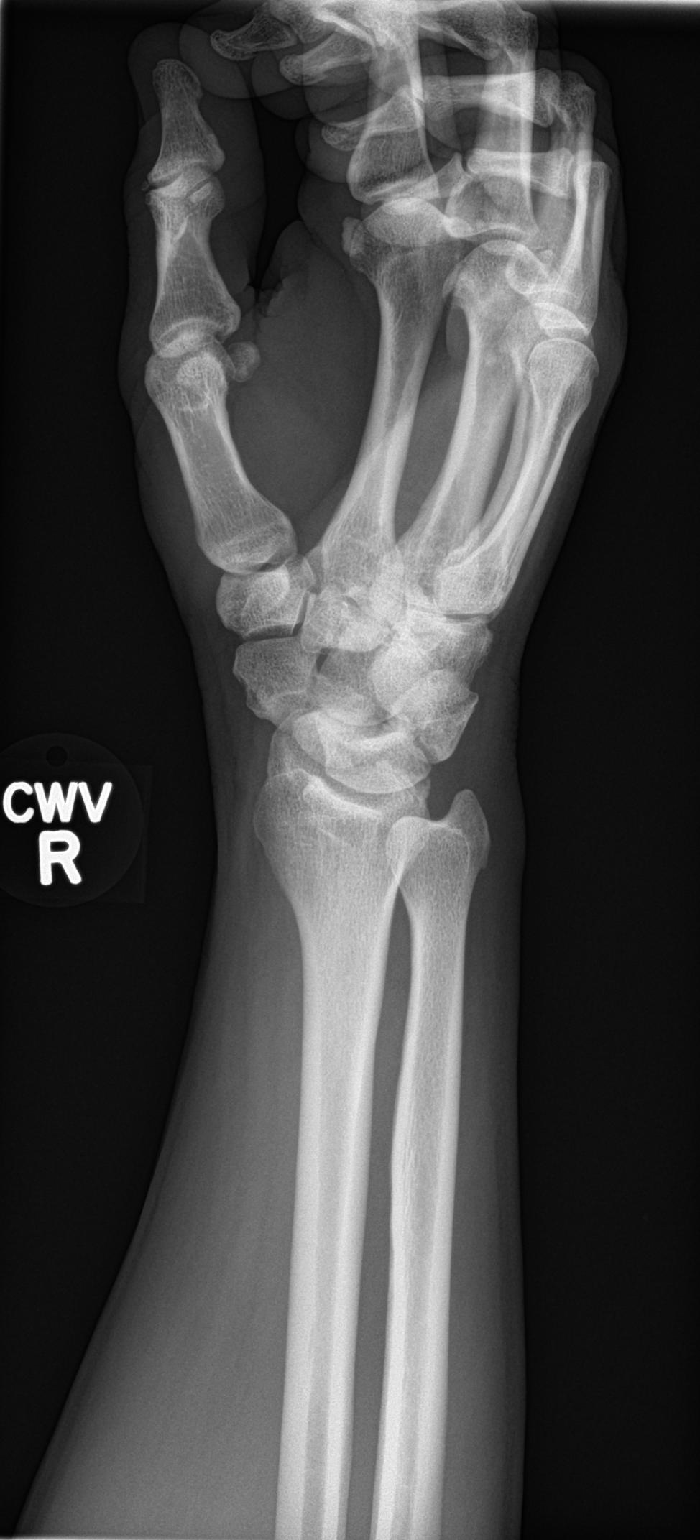
[im 3/4]
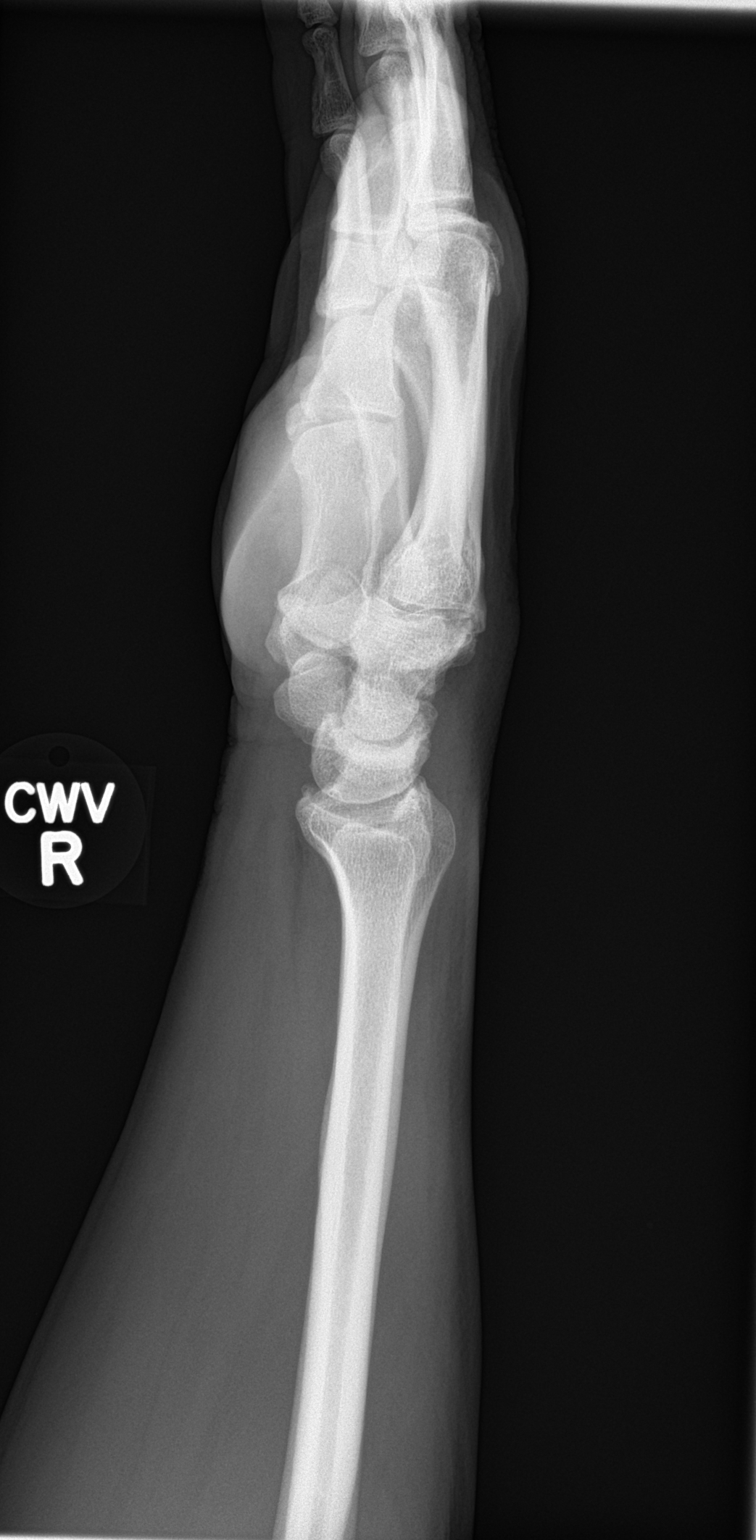
[im 4/4]
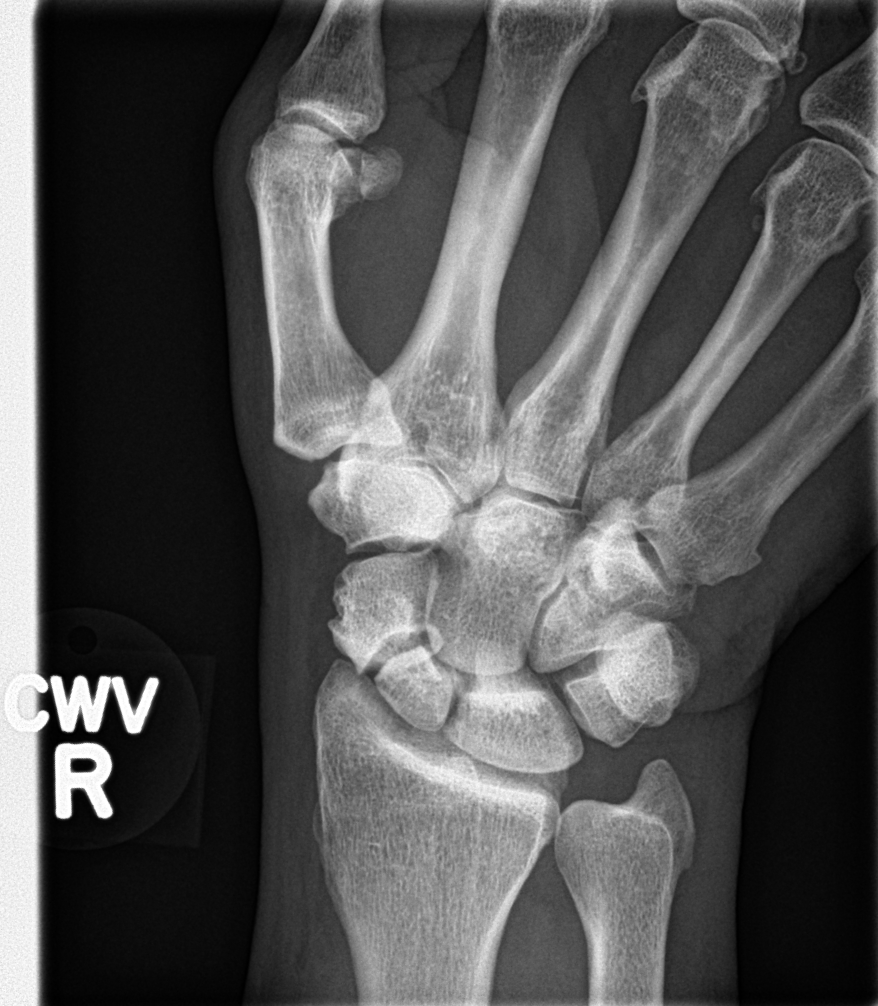

[4 of 4 positions shown; findings below may reference images not displayed]

FINDINGS: The bones are adequately mineralized. There is no evidence of acute
fracture or dislocation. The scaphoid has an abnormal appearance
with 2 separate well corticated components, favored to reflect an
incidental bipartite scaphoid. There is some controversy regarding
this entity, and some believe this is secondary to an old scaphoid
fracture with nonunion. There is no evidence of avascular necrosis.
Mild metacarpal phalangeal degenerative changes are noted.
IMPRESSION: No acute osseous findings. Bipartite scaphoid versus old fracture
with nonunion.

## 2015-05-14 ENCOUNTER — Other Ambulatory Visit: Payer: Self-pay | Admitting: Physician Assistant

## 2015-05-14 DIAGNOSIS — M545 Low back pain, unspecified: Secondary | ICD-10-CM

## 2015-06-19 ENCOUNTER — Ambulatory Visit (INDEPENDENT_AMBULATORY_CARE_PROVIDER_SITE_OTHER): Payer: Managed Care, Other (non HMO) | Admitting: Cardiovascular Disease

## 2015-06-19 ENCOUNTER — Encounter: Payer: Self-pay | Admitting: Cardiovascular Disease

## 2015-06-19 VITALS — BP 120/80 | HR 52 | Ht 69.0 in | Wt 169.8 lb

## 2015-06-19 DIAGNOSIS — I209 Angina pectoris, unspecified: Secondary | ICD-10-CM | POA: Diagnosis not present

## 2015-06-19 DIAGNOSIS — I10 Essential (primary) hypertension: Secondary | ICD-10-CM

## 2015-06-19 DIAGNOSIS — E785 Hyperlipidemia, unspecified: Secondary | ICD-10-CM

## 2015-06-19 DIAGNOSIS — I2510 Atherosclerotic heart disease of native coronary artery without angina pectoris: Secondary | ICD-10-CM

## 2015-06-19 MED ORDER — CLOPIDOGREL BISULFATE 75 MG PO TABS: 75.0000 mg | ORAL_TABLET | Freq: Every day | ORAL | Status: DC

## 2015-06-19 MED ORDER — ROSUVASTATIN CALCIUM 5 MG PO TABS
5.0000 mg | ORAL_TABLET | Freq: Every day | ORAL | Status: DC
Start: 2015-06-19 — End: 2015-10-13

## 2015-06-19 MED ORDER — METOPROLOL SUCCINATE ER 25 MG PO TB24: 25.0000 mg | ORAL_TABLET | Freq: Every day | ORAL | Status: DC

## 2015-06-19 NOTE — Assessment & Plan Note (Signed)
Currently with no symptoms of angina. No further workup at this time.  Difficulty tolerating statins We'll start low-dose Crestor 2 days per week

## 2015-06-19 NOTE — Assessment & Plan Note (Signed)
Recommended he try Crestor 5 mg 2 days per week

## 2015-06-19 NOTE — Assessment & Plan Note (Signed)
Blood pressure is well controlled on today's visit. No changes made to the medications. 

## 2015-06-19 NOTE — Patient Instructions (Addendum)
You are doing well.  Please try crestor 5 mg twice days a week  we will check labs in December NMR Lipoprofile Lipoprotein (a) CRP  Please call us if you have new issues that need to be addressed before your next appt.  Your physician wants you to follow-up in: 12 months.  You will receive a reminder letter in the mail two months in advance. If you don't receive a letter, please call our office to schedule the follow-up appointment.

## 2015-06-19 NOTE — Assessment & Plan Note (Signed)
Currently with no symptoms of angina. No further testing 

## 2015-06-19 NOTE — Progress Notes (Signed)
Patient ID: Jeff Fields, male    DOB: 25-Feb-1961, 54 y.o.   MRN: [id number]  HPI Comments:  Jeff Fields is a 54 year old gentleman with remote smoking history, patient of Dr. Sullivan Lone, initially presenting with chest pain on exertion,   stress test that showed anterior wall ischemia, cardiac catheterization and stent placement to his mid LAD 11/08/14. He presents today for follow-up of his coronary artery disease  In follow-up today, he reports that he is been doing well. His weight is down 16 pounds He denies any significant angina Unable to tolerate lovastatin even at very low doses Previously unable to tolerate Lipitor No recent lab work but did have a health employee screening showing total cholesterol 144, LDL 78  EKG on today's visit shows normal sinus rhythm with rate 53 bpm, no significant ST or T-wave changes  Other past medical history His stress test showed anterior wall ischemia seen in both non-attenuation corrected and attenuation corrected images. Wall motion abnormality to the anterior and anteroseptal wall   Father diagnosed with testicular cancer requiring treatment. Patient found a lump on his testicle and after significant workup, this was found to be a benign finding       No Known Allergies  Outpatient Encounter Prescriptions as of 06/19/2015  Medication Sig  . aspirin 81 MG tablet Take 81 mg by mouth daily.  . clopidogrel (PLAVIX) 75 MG tablet Take 1 tablet (75 mg total) by mouth daily.  . metoprolol succinate (TOPROL-XL) 25 MG 24 hr tablet 1 tablet daily.  . rosuvastatin (CRESTOR) 5 MG tablet Take 1 tablet (5 mg total) by mouth daily.  . [DISCONTINUED] amLODipine (NORVASC) 10 MG tablet Take 1 tablet (10 mg total) by mouth daily. (Patient not taking: Reported on 06/19/2015)  . [DISCONTINUED] atorvastatin (LIPITOR) 40 MG tablet 1 tablet daily.  . [DISCONTINUED] cyclobenzaprine (FLEXERIL) 10 MG tablet Take 1 tablet by mouth every 8 (eight) hours as needed.  .  [DISCONTINUED] lovastatin (MEVACOR) 20 MG tablet Take 1 tablet (20 mg total) by mouth at bedtime. (Patient not taking: Reported on 06/19/2015)  . [DISCONTINUED] meloxicam (MOBIC) 15 MG tablet TAKE ONE TABLET BY MOUTH ONCE DAILY (Patient not taking: Reported on 06/19/2015)  . [DISCONTINUED] metoprolol succinate (TOPROL-XL) 50 MG 24 hr tablet Take 1 tablet by mouth daily.  . [DISCONTINUED] metoprolol tartrate (LOPRESSOR) 25 MG tablet Take 1 tablet (25 mg total) by mouth 2 (two) times daily. (Patient not taking: Reported on 06/19/2015)   No facility-administered encounter medications on file as of 06/19/2015.    Past Medical History  Diagnosis Date  . Hypertension     Past Surgical History  Procedure Laterality Date  . Cardiac catheterization  11/08/2014  . Coronary angioplasty with stent placement  11/08/2014    drug eluting stent placement to the mid LAD  . Vasectomy  2000    Social History  reports that he quit smoking about 26 years ago. His smoking use included Cigarettes. He smoked 1.00 pack per day. He does not have any smokeless tobacco history on file. He reports that he drinks alcohol. He reports that he does not use illicit drugs.  Family History family history includes Arrhythmia in his father; Arthritis in his brother; Hypertension in his brother and father; Lung cancer in his maternal grandfather; Stroke in his maternal grandmother.   Review of Systems  Constitutional: Negative.   Respiratory: Negative.   Cardiovascular: Negative.   Gastrointestinal: Negative.   Musculoskeletal: Negative.   Skin: Negative.   Neurological:  Negative.   Hematological: Negative.   Psychiatric/Behavioral: Negative.   All other systems reviewed and are negative.   BP 120/80 mmHg  Pulse 52  Ht  (1.753 m)  Wt 169 lb 12 oz (76.998 kg)  BMI 25.06 kg/m2  Physical Exam  Constitutional: He is oriented to person, place, and time. He appears well-developed and well-nourished.  HENT:   Head: Normocephalic.  Nose: Nose normal.  Mouth/Throat: Oropharynx is clear and moist.  Eyes: Conjunctivae are normal. Pupils are equal, round, and reactive to light.  Neck: Normal range of motion. Neck supple. No JVD present.  Cardiovascular: Normal rate, regular rhythm, S1 normal, S2 normal, normal heart sounds and intact distal pulses.  Exam reveals no gallop and no friction rub.   No murmur heard. Pulmonary/Chest: Effort normal and breath sounds normal. No respiratory distress. He has no wheezes. He has no rales. He exhibits no tenderness.  Abdominal: Soft. Bowel sounds are normal. He exhibits no distension. There is no tenderness.  Musculoskeletal: Normal range of motion. He exhibits no edema or tenderness.  Lymphadenopathy:    He has no cervical adenopathy.  Neurological: He is alert and oriented to person, place, and time. Coordination normal.  Skin: Skin is warm and dry. No rash noted. No erythema.  Psychiatric: He has a normal mood and affect. His behavior is normal. Judgment and thought content normal.      Assessment and Plan   Nursing note and vitals reviewed.

## 2015-08-14 ENCOUNTER — Other Ambulatory Visit: Payer: Managed Care, Other (non HMO)

## 2015-08-28 ENCOUNTER — Other Ambulatory Visit: Payer: Managed Care, Other (non HMO)

## 2015-08-28 ENCOUNTER — Other Ambulatory Visit: Payer: Self-pay

## 2015-08-28 MED ORDER — METOPROLOL TARTRATE 25 MG PO TABS: 25.0000 mg | ORAL_TABLET | Freq: Two times a day (BID) | ORAL | Status: DC

## 2015-08-28 NOTE — Telephone Encounter (Signed)
Refill sent for metoprolol tart 25 mg take one tablet twice a day.

## 2015-09-01 ENCOUNTER — Ambulatory Visit: Payer: Managed Care, Other (non HMO) | Admitting: Family Medicine

## 2015-09-29 ENCOUNTER — Ambulatory Visit: Payer: Managed Care, Other (non HMO) | Admitting: Family Medicine

## 2015-10-10 ENCOUNTER — Ambulatory Visit (INDEPENDENT_AMBULATORY_CARE_PROVIDER_SITE_OTHER): Payer: Managed Care, Other (non HMO) | Admitting: Family Medicine

## 2015-10-10 ENCOUNTER — Telehealth: Payer: Self-pay

## 2015-10-10 ENCOUNTER — Encounter: Payer: Self-pay | Admitting: Family Medicine

## 2015-10-10 VITALS — BP 132/70 | HR 62 | Temp 99.0°F | Ht 67.4 in | Wt 169.0 lb

## 2015-10-10 DIAGNOSIS — I2510 Atherosclerotic heart disease of native coronary artery without angina pectoris: Secondary | ICD-10-CM

## 2015-10-10 DIAGNOSIS — E785 Hyperlipidemia, unspecified: Secondary | ICD-10-CM

## 2015-10-10 DIAGNOSIS — M5441 Lumbago with sciatica, right side: Secondary | ICD-10-CM | POA: Diagnosis not present

## 2015-10-10 DIAGNOSIS — Z1211 Encounter for screening for malignant neoplasm of colon: Secondary | ICD-10-CM

## 2015-10-10 DIAGNOSIS — I10 Essential (primary) hypertension: Secondary | ICD-10-CM

## 2015-10-10 NOTE — Patient Instructions (Addendum)
Glucosamine and CondriotinBack Exercises If you have pain in your back, do these exercises 2-3 times each day or as told by your doctor. When the pain goes away, do the exercises once each day, but repeat the steps more times for each exercise (do more repetitions). If you do not have pain in your back, do these exercises once each day or as told by your doctor. EXERCISES Single Knee to Chest Do these steps 3-5 times in a row for each leg: 1. Lie on your back on a firm bed or the floor with your legs stretched out. 2. Bring one knee to your chest. 3. Hold your knee to your chest by grabbing your knee or thigh. 4. Pull on your knee until you feel a gentle stretch in your lower back. 5. Keep doing the stretch for 10-30 seconds. 6. Slowly let go of your leg and straighten it. Pelvic Tilt Do these steps 5-10 times in a row: 1. Lie on your back on a firm bed or the floor with your legs stretched out. 2. Bend your knees so they point up to the ceiling. Your feet should be flat on the floor. 3. Tighten your lower belly (abdomen) muscles to press your lower back against the floor. This will make your tailbone point up to the ceiling instead of pointing down to your feet or the floor. 4. Stay in this position for 5-10 seconds while you gently tighten your muscles and breathe evenly. Cat-Cow Do these steps until your lower back bends more easily: 1. Get on your hands and knees on a firm surface. Keep your hands under your shoulders, and keep your knees under your hips. You may put padding under your knees. 2. Let your head hang down, and make your tailbone point down to the floor so your lower back is round like the back of a cat. 3. Stay in this position for 5 seconds. 4. Slowly lift your head and make your tailbone point up to the ceiling so your back hangs low (sags) like the back of a cow. 5. Stay in this position for 5 seconds. Press-Ups Do these steps 5-10 times in a row: 1. Lie on your belly  (face-down) on the floor. 2. Place your hands near your head, about shoulder-width apart. 3. While you keep your back relaxed and keep your hips on the floor, slowly straighten your arms to raise the top half of your body and lift your shoulders. Do not use your back muscles. To make yourself more comfortable, you may change where you place your hands. 4. Stay in this position for 5 seconds. 5. Slowly return to lying flat on the floor. Bridges Do these steps 10 times in a row: 1. Lie on your back on a firm surface. 2. Bend your knees so they point up to the ceiling. Your feet should be flat on the floor. 3. Tighten your butt muscles and lift your butt off of the floor until your waist is almost as high as your knees. If you do not feel the muscles working in your butt and the back of your thighs, slide your feet 1-2 inches farther away from your butt. 4. Stay in this position for 3-5 seconds. 5. Slowly lower your butt to the floor, and let your butt muscles relax. If this exercise is too easy, try doing it with your arms crossed over your chest. Belly Crunches Do these steps 5-10 times in a row: 1. Lie on your back on a  firm bed or the floor with your legs stretched out. 2. Bend your knees so they point up to the ceiling. Your feet should be flat on the floor. 3. Cross your arms over your chest. 4. Tip your chin a little bit toward your chest but do not bend your neck. 5. Tighten your belly muscles and slowly raise your chest just enough to lift your shoulder blades a tiny bit off of the floor. 6. Slowly lower your chest and your head to the floor. Back Lifts Do these steps 5-10 times in a row: 1. Lie on your belly (face-down) with your arms at your sides, and rest your forehead on the floor. 2. Tighten the muscles in your legs and your butt. 3. Slowly lift your chest off of the floor while you keep your hips on the floor. Keep the back of your head in line with the curve in your back. Look  at the floor while you do this. 4. Stay in this position for 3-5 seconds. 5. Slowly lower your chest and your face to the floor. GET HELP IF:  Your back pain gets a lot worse when you do an exercise.  Your back pain does not lessen 2 hours after you exercise. If you have any of these problems, stop doing the exercises. Do not do them again unless your doctor says it is okay. GET HELP RIGHT AWAY IF:  You have sudden, very bad back pain. If this happens, stop doing the exercises. Do not do them again unless your doctor says it is okay.   This information is not intended to replace advice given to you by your health care provider. Make sure you discuss any questions you have with your health care provider.   Document Released: 10/02/2010 Document Revised: 05/21/2015 Document Reviewed: 10/24/2014 Elsevier Interactive Patient Education [employer]

## 2015-10-10 NOTE — Progress Notes (Signed)
BP 132/70 mmHg  Pulse 62  Temp(Src) 99 F (37.2 C)  Ht 5' 7.4" (1.712 m)  Wt 169 lb (76.658 kg)  BMI 26.15 kg/m2  SpO2 99%   Subjective:    Patient ID: Jeff Fields, male    DOB: Dec 03, 1960, 55 y.o.   MRN: [id number]  HPI: Jeff Fields is a 55 y.o. male who presents today to establish care. He has been following at [facility] previously and last saw them for a regular follow up a bit ago, but has been following with cardiology  Chief Complaint  Patient presents with  . Establish Care   Known CAD with angina, HTN, HLD. Follows with Dr. Mariah Milling. Last saw him in October. Has been unable to tolerate lovastatin or lipitor. Seems to be doing well on crestor 3 days per week, significantly less aching.  Good to follow up with Dr. Mariah Milling in 6 months  HYPERTENSION / HYPERLIPIDEMIA Satisfied with current treatment? yes Duration of hypertension: 1 year BP monitoring frequency: a few times a month BP range: 110s/ 70s BP medication side effects: no Duration of hyperlipidemia: On meds due to CAD, no history of really high cholesterol Cholesterol medication side effects: yes Cholesterol supplements: none Past cholesterol medications: atorvastain (lipitor), lovastatin (mevacor) and rosuvastatin (crestor) Medication compliance: excellent compliance Aspirin: yes Recent stressors: no Recurrent headaches: no Visual changes: no Palpitations: no Dyspnea: no Chest pain: no Lower extremity edema: no Dizzy/lightheaded: no  BACK PAIN- He notes that he has an arthritic back- gets a little bit painful  Duration: chronic Mechanism of injury: no trauma Location: bilateral and low back Onset: gradual Severity: moderate Quality: burning Frequency: intermittent Radiation: R leg below the knee Aggravating factors: lifting, movement, walking, laying, bending and prolonged sitting Alleviating factors: rest, ice, heat, laying and NSAIDs Status: fluctuating Treatments attempted: rest, ice,  heat, APAP, ibuprofen, aleve and physical therapy  Relief with NSAIDs?: moderate Nighttime pain:  no Paresthesias / decreased sensation:  yes Bowel / bladder incontinence:  no Fevers:  no Dysuria / urinary frequency:  no   Active Ambulatory Problems    Diagnosis Date Noted  . Angina pectoris (HCC) 09/17/2014  . Back pain 09/17/2014  . HTN (hypertension) 09/17/2014  . Smoking hx 09/17/2014  . CAD (coronary artery disease) 11/27/2014  . S/P coronary artery stent placement 11/27/2014  . Hyperlipidemia 11/27/2014  . Anxiety 02/12/2015  . Chest pain on exertion 02/12/2015  . Elevated blood pressure, situational 02/12/2015  . Flank pain 02/12/2015  . Benign hypertension 02/12/2015  . LBP (low back pain) 07/19/2005  . Malaise and fatigue 01/23/2010  . 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 Back pain, thoracic 02/12/2015  . Back muscle spasm 02/12/2015  . 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 Disorder of male genital organ 02/12/2015  . Pain in rib 02/12/2015  . Sleep disturbance 02/12/2015  . Sprain of wrist 02/12/2015   Resolved Ambulatory Problems    Diagnosis Date Noted  . No Resolved Ambulatory Problems   Past Medical History  Diagnosis Date  . Hypertension    Past Surgical History  Procedure Laterality Date  . Cardiac catheterization  11/08/2014  . Coronary angioplasty with stent placement  11/08/2014    drug eluting stent placement to the mid LAD  . Vasectomy  2000   No Known Allergies  Family History  Problem Relation Age of Onset  . Hypertension Father   . Arrhythmia Father     Pacemaker implant  . Hypertension Brother   . Arthritis  Brother   . Stroke Maternal Grandmother   . Lung cancer Maternal Grandfather   . Hypertension Mother    Social History   Social History  . Marital Status: Married    Spouse Name: N/A  . Number of Children: N/A  . Years of Education: N/A   Social History Main Topics  . Smoking status: Former Smoker -- 1.00 packs/day    Types:  Cigarettes    Quit date: 09/13/1988  . Smokeless tobacco: Never Used  . Alcohol Use: 0.0 oz/week    0 Standard drinks or equivalent per week     Comment: Rarely  . Drug Use: No  . Sexual Activity: Yes   Other Topics Concern  . None   Social History Narrative   Review of Systems  Constitutional: Negative.   Respiratory: Negative.   Cardiovascular: Negative.   Musculoskeletal: Positive for myalgias and back pain. Negative for joint swelling, arthralgias, gait problem, neck pain and neck stiffness.  Skin: Negative.   Psychiatric/Behavioral: Negative.     Per HPI unless specifically indicated above     Objective:    BP 132/70 mmHg  Pulse 62  Temp(Src) 99 F (37.2 C)  Ht 5' 7.4" (1.712 m)  Wt 169 lb (76.658 kg)  BMI 26.15 kg/m2  SpO2 99%  Wt Readings from Last 3 Encounters:  10/10/15 169 lb (76.658 kg)  06/19/15 169 lb 12 oz (76.998 kg)  02/12/15 175 lb (79.379 kg)    Physical Exam  Constitutional: He is oriented to person, place, and time. He appears well-developed and well-nourished. No distress.  HENT:  Head: Normocephalic and atraumatic.  Right Ear: Hearing normal.  Left Ear: Hearing normal.  Nose: Nose normal.  Eyes: Conjunctivae and lids are normal. Right eye exhibits no discharge. Left eye exhibits no discharge. No scleral icterus.  Cardiovascular: Normal rate, regular rhythm, normal heart sounds and intact distal pulses.  Exam reveals no gallop and no friction rub.   No murmur heard. Pulmonary/Chest: Effort normal and breath sounds normal. No respiratory distress. He has no wheezes. He has no rales. He exhibits no tenderness.  Musculoskeletal: Normal range of motion.  Neurological: He is alert and oriented to person, place, and time.  Skin: Skin is warm, dry and intact. No rash noted. No erythema. No pallor.  Psychiatric: He has a normal mood and affect. His speech is normal and behavior is normal. Judgment and thought content normal. Cognition and memory are  normal.  Nursing note and vitals reviewed.   Results for orders placed or performed in visit on 02/12/15  Basic metabolic panel  Result Value Ref Range   BUN 14 4 - 21 mg/dL  Lipid panel  Result Value Ref Range   Triglycerides 148 40 - 160 mg/dL   Cholesterol 161 0 - 096 mg/dL   HDL 31 (A) 35 - 70 mg/dL  Basic metabolic panel  Result Value Ref Range   Glucose 111 mg/dL   Creatinine 1.4 (A) 0.6 - 1.3 mg/dL      Assessment & Plan:   Problem List Items Addressed This Visit      Cardiovascular and Mediastinum   HTN (hypertension)    Better on recheck. Continue to monitor at home. Continue diet and exercise. Continue to follow with Dr. Mariah Milling. Continue current regimen. Continue to monitor.       CAD (coronary artery disease)    Stable with no symptoms at this time. Continue to follow with Dr. Mariah Milling. Continue diet and exercise. Continue current  regimen. Continue to monitor.         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 Exercises given today. Continue exercise. Discussed glucosamine and condroiton to help with the pain. He will consider it.       Relevant Medications   meloxicam (MOBIC) 15 MG tablet   Hyperlipidemia    Good at last check with Dr. Mariah Milling. Taking  crestor 3-4x a week. Continue current regimen. Continue to monitor.        Other Visit Diagnoses    Screening for colon cancer    -  Primary    Needs to have colonoscopy. Referral generated today.    Relevant Orders    Ambulatory referral to General Surgery        Follow up plan: Return in about 6 months (around 04/08/2016) for Physical.

## 2015-10-10 NOTE — Assessment & Plan Note (Signed)
Good at last check with Dr. Mariah Milling. Taking  crestor 3-4x a week. Continue current regimen. Continue to monitor.

## 2015-10-10 NOTE — Assessment & Plan Note (Signed)
Better on recheck. Continue to monitor at home. Continue diet and exercise. Continue to follow with Dr. Mariah Milling. Continue current regimen. Continue to monitor.

## 2015-10-10 NOTE — Telephone Encounter (Signed)
Gastroenterology Pre-Procedure Review  Request Date:  Requesting Physician: Dr. Olevia Perches  PATIENT REVIEW QUESTIONS: The patient responded to the following health history questions as indicated:    1. Are you having any GI issues? no 2. Do you have a personal history of Polyps? no 3. Do you have a family history of Colon Cancer or Polyps? no 4. Diabetes Mellitus? no 5. Joint replacements in the past 12 months?no 6. Major health problems in the past 3 months?no 7. Any artificial heart valves, MVP, or defibrillator?yes (Stent x 1 year in Feb 2016)    MEDICATIONS & ALLERGIES:    Patient reports the following regarding taking any anticoagulation/antiplatelet therapy:   Plavix, Coumadin, Eliquis, Xarelto, Lovenox, Pradaxa, Brilinta, or Effient? yes (Plavix ) Aspirin? yes (ASA )  Patient confirms/reports the following medications:  Current Outpatient Prescriptions  Medication Sig Dispense Refill  . aspirin 81 MG tablet Take 81 mg by mouth daily.    . clopidogrel (PLAVIX) 75 MG tablet Take 1 tablet (75 mg total) by mouth daily. 90 tablet 3  . meloxicam (MOBIC) 15 MG tablet     . metoprolol tartrate (LOPRESSOR) 25 MG tablet Take 1 tablet (25 mg total) by mouth 2 (two) times daily. 60 tablet 3  . rosuvastatin (CRESTOR) 5 MG tablet Take 1 tablet (5 mg total) by mouth daily. 30 tablet 6   No current facility-administered medications for this visit.    Patient confirms/reports the following allergies:  No Known Allergies  No orders of the defined types were placed in this encounter.    AUTHORIZATION INFORMATION Primary Insurance: 1D#: Group #:  Secondary Insurance: 1D#: Group #:  SCHEDULE INFORMATION: Date: Will call pt with available date. Time: Location: [facility]

## 2015-10-10 NOTE — Assessment & Plan Note (Signed)
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 Exercises given today. Continue exercise. Discussed glucosamine and condroiton to help with the pain. He will consider it.

## 2015-10-10 NOTE — Assessment & Plan Note (Signed)
Stable with no symptoms at this time. Continue to follow with Dr. Mariah Milling. Continue diet and exercise. Continue current regimen. Continue to monitor.

## 2015-10-13 ENCOUNTER — Telehealth: Payer: Self-pay | Admitting: *Deleted

## 2015-10-13 ENCOUNTER — Other Ambulatory Visit: Payer: Self-pay | Admitting: *Deleted

## 2015-10-13 MED ORDER — METOPROLOL TARTRATE 25 MG PO TABS: 25.0000 mg | ORAL_TABLET | Freq: Two times a day (BID) | ORAL | Status: DC

## 2015-10-13 MED ORDER — ROSUVASTATIN CALCIUM 5 MG PO TABS: 5.0000 mg | ORAL_TABLET | Freq: Every day | ORAL | Status: DC

## 2015-10-13 MED ORDER — CLOPIDOGREL BISULFATE 75 MG PO TABS: 75.0000 mg | ORAL_TABLET | Freq: Every day | ORAL | Status: DC

## 2015-10-13 NOTE — Telephone Encounter (Signed)
°*  STAT* If patient is at the pharmacy, call can be transferred to refill team.   1. Which medications need to be refilled? (please list name of each medication and dose if known) Clopidogrel Bisulfate, Rosuvastatin Calcium  2. Which pharmacy/location (including street and city if local pharmacy) is medication to be sent to? [facility]   3. Do they need a 30 day or 90 day supply? 90    Please send B[facility] blocker to [facility] [address].

## 2015-10-13 NOTE — Telephone Encounter (Signed)
Requested Prescriptions   Signed Prescriptions Disp Refills  . clopidogrel (PLAVIX) 75 MG tablet 90 tablet 3    Sig: Take 1 tablet (75 mg total) by mouth daily.    Authorizing Provider: Antonieta Iba    Ordering User: Shawnie Dapper, MARINA C  . rosuvastatin (CRESTOR) 5 MG tablet 90 tablet 3    Sig: Take 1 tablet (5 mg total) by mouth daily.    Authorizing Provider: Antonieta Iba    Ordering User: Shawnie Dapper, MARINA C  . metoprolol tartrate (LOPRESSOR) 25 MG tablet 180 tablet 3    Sig: Take 1 tablet (25 mg total) by mouth 2 (two) times daily.    Authorizing Provider: Antonieta Iba    Ordering User: Kendrick Fries

## 2015-12-12 ENCOUNTER — Encounter: Payer: Self-pay | Admitting: Family Medicine

## 2015-12-12 ENCOUNTER — Ambulatory Visit (INDEPENDENT_AMBULATORY_CARE_PROVIDER_SITE_OTHER): Payer: Managed Care, Other (non HMO) | Admitting: Family Medicine

## 2015-12-12 VITALS — BP 137/86 | HR 60 | Temp 97.7°F | Wt 167.0 lb

## 2015-12-12 DIAGNOSIS — J069 Acute upper respiratory infection, unspecified: Secondary | ICD-10-CM | POA: Diagnosis not present

## 2015-12-12 DIAGNOSIS — R509 Fever, unspecified: Secondary | ICD-10-CM | POA: Diagnosis not present

## 2015-12-12 DIAGNOSIS — J029 Acute pharyngitis, unspecified: Secondary | ICD-10-CM

## 2015-12-12 DIAGNOSIS — R059 Cough, unspecified: Secondary | ICD-10-CM

## 2015-12-12 DIAGNOSIS — R05 Cough: Secondary | ICD-10-CM

## 2015-12-12 NOTE — Progress Notes (Signed)
BP 137/86 mmHg  Pulse 60  Temp(Src) 97.7 F (36.5 C)  Wt 167 lb (75.751 kg)  SpO2 100%   Subjective:    Patient ID: Jeff Fields, male    DOB: [birth year], 55 y.o.   MRN: [id number]  HPI: Jeff Fields is a 54 y.o. male  Chief Complaint  Patient presents with  . URI    X 5 days, cough, sore throat fever and chills   UPPER RESPIRATORY TRACT INFECTION Duration: 5 days Worst symptom: fever, chills, sore throat Fever: yes Cough: yes Shortness of breath: no Wheezing: no Chest pain: no Chest tightness: no Chest congestion: no Nasal congestion: yes Runny nose: yes Post nasal drip: yes Sneezing: no Sore throat: yes Swollen glands: no Sinus pressure: yes Headache: yes Face pain: no Toothache: no Ear pain: yes, bilaterally Ear pressure: yes bilateral Eyes red/itching: yes Eye drainage/crusting: no  Vomiting: no Rash: no Fatigue: yes Sick contacts: yes Strep contacts: no  Context: better Recurrent sinusitis: no Relief with OTC cold/cough medications: no  Treatments attempted: cold/sinus, anti-histamine and pseudoephedrine   Relevant past medical, surgical, family and social history reviewed and updated as indicated. Interim medical history since our last visit reviewed. Allergies and medications reviewed and updated.  Review of Systems  Constitutional: Positive for fever, chills, diaphoresis and fatigue. Negative for activity change, appetite change and unexpected weight change.  HENT: Positive for congestion, ear pain, postnasal drip, rhinorrhea, sinus pressure, sneezing and sore throat. Negative for dental problem, drooling, ear discharge, facial swelling, hearing loss, mouth sores, nosebleeds, tinnitus, trouble swallowing and voice change.   Eyes: Negative.   Respiratory: Positive for cough. Negative for apnea, choking, chest tightness, shortness of breath, wheezing and stridor.   Cardiovascular: Negative.   Psychiatric/Behavioral: Negative.     Per HPI unless  specifically indicated above     Objective:    BP 137/86 mmHg  Pulse 60  Temp(Src) 97.7 F (36.5 C)  Wt 167 lb (75.751 kg)  SpO2 100%  Wt Readings from Last 3 Encounters:  12/12/15 167 lb (75.751 kg)  10/10/15 169 lb (76.658 kg)  06/19/15 169 lb 12 oz (76.998 kg)    Physical Exam  Constitutional: He is oriented to person, place, and time. He appears well-developed and well-nourished. No distress.  HENT:  Head: Normocephalic and atraumatic.  Right Ear: Hearing, tympanic membrane, external ear and ear canal normal.  Left Ear: Hearing, tympanic membrane, external ear and ear canal normal.  Nose: Mucosal edema and rhinorrhea present.  Mouth/Throat: Uvula is midline, oropharynx is clear and moist and mucous membranes are normal. No oropharyngeal exudate.  Eyes: Conjunctivae, EOM and lids are normal. Pupils are equal, round, and reactive to light. Right eye exhibits no discharge. Left eye exhibits no discharge. No scleral icterus.  Neck: Normal range of motion. Neck supple. No JVD present. No tracheal deviation present. No thyromegaly present.  Cardiovascular: Normal rate, regular rhythm, normal heart sounds and intact distal pulses.  Exam reveals no gallop and no friction rub.   No murmur heard. Pulmonary/Chest: Effort normal and breath sounds normal. No respiratory distress. He has no wheezes. He has no rales. He exhibits no tenderness.  Musculoskeletal: Normal range of motion.  Lymphadenopathy:    He has cervical adenopathy.  Neurological: He is alert and oriented to person, place, and time.  Skin: Skin is warm, dry and intact. No rash noted. He is not diaphoretic. No erythema. No pallor.  Psychiatric: He has a normal mood and affect. His speech  is normal and behavior is normal. Judgment and thought content normal. Cognition and memory are normal.  Nursing note and vitals reviewed.   Results for orders placed or performed in visit on 02/12/15  Basic metabolic panel  Result Value  Ref Range   BUN 14 4 - 21 mg/dL  Lipid panel  Result Value Ref Range   Triglycerides 148 40 - 160 mg/dL   Cholesterol 161191 0 - 096200 mg/dL   HDL 31 (A) 35 - 70 mg/dL  Basic metabolic panel  Result Value Ref Range   Glucose 111 mg/dL   Creatinine 1.4 (A) 0.6 - 1.3 mg/dL      Assessment & Plan:   Problem List Items Addressed This Visit    None    Visit Diagnoses    Upper respiratory infection    -  Primary    Rest, fluids and OTC medication. Call if not getting better. Out of work until Monday. Continue to monitor.     Fever, unspecified fever cause        Flu and strep negative.    Relevant Orders    Influenza A & B (STAT)    Rapid strep screen (not at [facility])    Cough        flu and strep negative.     Relevant Orders    Influenza A & B (STAT)    Sore throat        Flu and strep negative.    Relevant Orders    Rapid strep screen (not at [facility])        Follow up plan: Return if symptoms worsen or fail to improve.

## 2015-12-16 LAB — CULTURE, GROUP A STREP: Strep A Culture: NEGATIVE

## 2015-12-16 LAB — VERITOR FLU A/B WAIVED
INFLUENZA A: NEGATIVE
INFLUENZA B: NEGATIVE

## 2015-12-16 LAB — RAPID STREP SCREEN (MED CTR MEBANE ONLY): Strep Gp A Ag, IA W/Reflex: NEGATIVE

## 2016-03-04 ENCOUNTER — Telehealth: Payer: Self-pay | Admitting: Cardiovascular Disease

## 2016-03-04 NOTE — Telephone Encounter (Signed)
Patient wanting to be seen soon for odd feeling fluttering / breathing issue when laying down for the past 2 weeks.  Please call to discuss .

## 2016-03-04 NOTE — Telephone Encounter (Signed)
Pt placed on wait list in the event of a cancellation.

## 2016-03-05 ENCOUNTER — Ambulatory Visit (INDEPENDENT_AMBULATORY_CARE_PROVIDER_SITE_OTHER): Payer: Managed Care, Other (non HMO) | Admitting: Cardiovascular Disease

## 2016-03-05 ENCOUNTER — Encounter: Payer: Self-pay | Admitting: Cardiovascular Disease

## 2016-03-05 VITALS — BP 130/80 | HR 64 | Ht 69.0 in | Wt 170.5 lb

## 2016-03-05 DIAGNOSIS — I499 Cardiac arrhythmia, unspecified: Secondary | ICD-10-CM | POA: Diagnosis not present

## 2016-03-05 DIAGNOSIS — I10 Essential (primary) hypertension: Secondary | ICD-10-CM | POA: Diagnosis not present

## 2016-03-05 DIAGNOSIS — I2510 Atherosclerotic heart disease of native coronary artery without angina pectoris: Secondary | ICD-10-CM

## 2016-03-05 DIAGNOSIS — E785 Hyperlipidemia, unspecified: Secondary | ICD-10-CM

## 2016-03-05 DIAGNOSIS — Z955 Presence of coronary angioplasty implant and graft: Secondary | ICD-10-CM

## 2016-03-05 MED ORDER — PREDNISONE 20 MG PO TABS: 20.0000 mg | ORAL_TABLET | Freq: Every day | ORAL | Status: DC

## 2016-03-05 NOTE — Patient Instructions (Addendum)
Medication Instructions:   For poison ivy, Take prednisone : 60 mg x 3 day 40 mg x 3 days, 20 mg x 3 days 10 mg x 3 days    Follow-Up: It was a pleasure seeing you in the office today. Please call us if you have new issues that need to be addressed before your next appt.  [phone]  Your physician wants you to follow-up in: 12 months.  You will receive a reminder letter in the mail two months in advance. If you don't receive a letter, please call our office to schedule the follow-up appointment.  If you need a refill on your cardiac medications before your next appointment, please call your pharmacy.

## 2016-03-05 NOTE — Progress Notes (Signed)
Patient ID: Jeff Fields, male   DOB: 08/08/1961, 55 y.o.   MRN: [id number] Cardiology Office Note  Date:  03/05/2016   ID:  Jeff ChapelMark R Atchley, DOB 03/28/1961, MRN [id number]  PCP:  Olevia PerchesMegan Johnson, DO   Chief Complaint  Patient presents with  . other    C/o irregular heart beat. Meds reviewd verbally with pt.    HPI:  Jeff Fields is a 55 year old gentleman with remote smoking history, patient of Dr. Sullivan LoneGilbert, initially presenting with chest pain on exertion, stress test that showed anterior wall ischemia, cardiac catheterization and stent placement to his mid LAD 11/08/14. He presents today for follow-up of his coronary artery disease  In follow-up, he reports that he is doing well, Reports having palpitations at nighttime, worse when he lays on his left side. Severe poison ivy Denies any chest pain concerning for angina, Tolerating low-dose Crestor daily 5 mg  EKG on today's visit shows normal sinus rhythm with rate 64 bpm, no significant ST or T-wave changes   other past medical history His stress test showed anterior wall ischemia seen in both non-attenuation corrected and attenuation corrected images. Wall motion abnormality to the anterior and anteroseptal wall  Father diagnosed with testicular cancer requiring treatment. Patient found a lump on his testicle and after significant workup, this was found to be a benign finding  PMH:   has a past medical history of Hypertension. Also history of coronary artery disease, prior stent  PSH:    Past Surgical History  Procedure Laterality Date  . Cardiac catheterization  11/08/2014  . Coronary angioplasty with stent placement  11/08/2014    drug eluting stent placement to the mid LAD  . Vasectomy  2000    Current Outpatient Prescriptions  Medication Sig Dispense Refill  . aspirin 81 MG tablet Take 81 mg by mouth daily.    . clopidogrel (PLAVIX) 75 MG tablet Take 1 tablet (75 mg total) by mouth daily. 90 tablet 3  . meloxicam (MOBIC) 15 MG  tablet     . metoprolol tartrate (LOPRESSOR) 25 MG tablet Take 1 tablet (25 mg total) by mouth 2 (two) times daily. 180 tablet 3  . rosuvastatin (CRESTOR) 5 MG tablet Take 1 tablet (5 mg total) by mouth daily. 90 tablet 3  . predniSONE (DELTASONE) 20 MG tablet Take 1 tablet (20 mg total) by mouth daily with breakfast. 20 tablet 0   No current facility-administered medications for this visit.     Allergies:   Review of patient's allergies indicates no known allergies.   Social History:  The patient  reports that he quit smoking about 27 years ago. His smoking use included Cigarettes. He smoked 1.00 pack per day. He has never used smokeless tobacco. He reports that he drinks alcohol. He reports that he does not use illicit drugs.   Family History:   family history includes Arrhythmia in his father; Arthritis in his brother; Hypertension in his brother, father, and mother; Lung cancer in his maternal grandfather; Stroke in his maternal grandmother.    Review of Systems: Review of Systems  Constitutional: Negative.   Respiratory: Negative.   Cardiovascular: Positive for palpitations.  Gastrointestinal: Negative.   Musculoskeletal: Negative.   Skin: Positive for itching and rash.  Neurological: Negative.   Psychiatric/Behavioral: Negative.   All other systems reviewed and are negative.    PHYSICAL EXAM: VS:  BP 130/80 mmHg  Pulse 64  Ht 5\' 9"  (1.753 m)  Wt 170 lb 8 oz (77.338 kg)  BMI 25.17 kg/m2 , BMI Body mass index is 25.17 kg/(m^2). GEN: Well nourished, well developed, in no acute distress HEENT: normal Neck: no JVD, carotid bruits, or masses Cardiac: RRR; no murmurs, rubs, or gallops,no edema  Respiratory:  clear to auscultation bilaterally, normal work of breathing GI: soft, nontender, nondistended, + BS MS: no deformity or atrophy Skin: warm and dry, no rash Neuro:  Strength and sensation are intact Psych: euthymic mood, full affect    Recent Labs: No results found  for requested labs within last 365 days.    Lipid Panel Lab Results  Component Value Date   CHOL 191 09/20/2014   HDL 31* 09/20/2014   TRIG [id number] 09/20/2014      Wt Readings from Last 3 Encounters:  03/05/16 170 lb 8 oz (77.338 kg)  12/12/15 167 lb (75.751 kg)  10/10/15 169 lb (76.658 kg)       ASSESSMENT AND PLAN:  Irregular heart beat - Plan: EKG 12-Lead Likely having APCs Long discussion, benign in nature We'll continue current dose of beta blocker  Coronary artery disease involving native coronary artery of native heart without angina pectoris Currently with no symptoms of angina. No further workup at this time. Continue current medication regimen.  Hyperlipidemia Tolerating low-dose Crestor 5 mg daily We'll repeat lab work in follow-up  Essential hypertension Blood pressure is well controlled on today's visit. No changes made to the medications.  S/P coronary artery stent placement Encouraged him to stay on his aspirin and Plavix   no symptoms concerning for angina  Poison ivy We have given him a prednisone taper given the severity of his rash on his legs   Total encounter time more than 15 minutes  Greater than 50% was spent in counseling and coordination of care with the patient   Disposition:   F/U  12 months   Orders Placed This Encounter  Procedures  . EKG 12-Lead     Signed, Dossie Arbourim Gollan, M.D., Ph.D. 03/05/2016  [facility], [place] [phone]

## 2016-03-05 NOTE — Telephone Encounter (Signed)
Pt coming in today @ 3:20 to see Dr. Mariah MillingGollan.

## 2016-03-08 ENCOUNTER — Telehealth: Payer: Self-pay | Admitting: Cardiovascular Disease

## 2016-03-08 NOTE — Telephone Encounter (Signed)
Pt would like a note for work stating he was here on Friday

## 2016-03-08 NOTE — Telephone Encounter (Signed)
Spoke w/ pt.  Advised him that I can write him a letter.  He will stop by to pick up later today.

## 2016-03-08 NOTE — Telephone Encounter (Signed)
Patient request fax to  Work fax [phone] (att : Ernestene KielMark Cookson)

## 2016-03-26 ENCOUNTER — Ambulatory Visit: Payer: Managed Care, Other (non HMO) | Admitting: Physician Assistant

## 2016-04-08 ENCOUNTER — Encounter: Payer: Managed Care, Other (non HMO) | Admitting: Family Medicine

## 2016-04-08 ENCOUNTER — Ambulatory Visit: Payer: Managed Care, Other (non HMO) | Admitting: Family Medicine

## 2016-09-21 ENCOUNTER — Telehealth: Payer: Self-pay | Admitting: Cardiovascular Disease

## 2016-09-21 NOTE — Telephone Encounter (Signed)
Would stay on aspirin through the procedure Could hold Plavix for 5 days prior to dental procedure Restart 24 hours after procedure

## 2016-09-21 NOTE — Telephone Encounter (Signed)
Pt calling stating he is going to the dentist and he is needing a tooth pulled He is going around 1 pm today  But he is on a blood thinner would like to make sure he is okay to do this He did not take it today but he took it yesterday  Please advise.

## 2016-09-21 NOTE — Telephone Encounter (Signed)
Spoke w/ pt.  Advised him of Dr. Windell HummingbirdGollan's recommendation.  He is appreciative, but is going to have a root canal instead of an extraction.  Asked him to call back if we can be of further assistance.

## 2016-09-21 NOTE — Telephone Encounter (Signed)
Pt has rescheduled to a later date to accommodate any instructions given regarding his Plavix & aspirin prior to tooth extraction.

## 2016-10-08 ENCOUNTER — Other Ambulatory Visit: Payer: Self-pay | Admitting: Cardiovascular Disease

## 2016-10-09 ENCOUNTER — Other Ambulatory Visit: Payer: Self-pay | Admitting: Cardiovascular Disease

## 2016-11-18 ENCOUNTER — Encounter: Payer: Self-pay | Admitting: Family Medicine

## 2016-11-18 ENCOUNTER — Ambulatory Visit (INDEPENDENT_AMBULATORY_CARE_PROVIDER_SITE_OTHER): Payer: Managed Care, Other (non HMO) | Admitting: Family Medicine

## 2016-11-18 VITALS — BP 135/82 | HR 58 | Temp 98.4°F | Wt 185.0 lb

## 2016-11-18 DIAGNOSIS — G8929 Other chronic pain: Secondary | ICD-10-CM

## 2016-11-18 DIAGNOSIS — M5441 Lumbago with sciatica, right side: Secondary | ICD-10-CM | POA: Diagnosis not present

## 2016-11-18 MED ORDER — MELOXICAM 15 MG PO TABS: 15.0000 mg | ORAL_TABLET | Freq: Every day | ORAL | 0 refills | Status: DC

## 2016-11-18 MED ORDER — CYCLOBENZAPRINE HCL 10 MG PO TABS: 10.0000 mg | ORAL_TABLET | Freq: Three times a day (TID) | ORAL | 0 refills | Status: DC | PRN

## 2016-11-18 NOTE — Assessment & Plan Note (Deleted)
Prescribe Mobic and Flexeril. Counseled on epsom salt baths and stretching exercises. BMP in clinic to evaluate kidney functioning.

## 2016-11-18 NOTE — Progress Notes (Signed)
BP 135/82   Pulse (!) 58   Temp 98.4 F (36.9 C)   Wt 185 lb (83.9 kg)   SpO2 100%   BMI 27.32 kg/m    Subjective:    Patient ID: Jeff Fields Lillibridge, male    DOB: 06/18/1961, 56 y.o.   MRN: [id number]  HPI: Jeff Fields Terrance is a 56 y.o. male  Chief Complaint  Patient presents with  . Back Pain    Has chronic back issues, been worse last 3 days. Low Back feels like it's on fire, radiates down his leg. Would like a refill on Mobic.   Back pain Patient with a history of degenerative disc disease presents to clinic complaining of chronic lower back pain that radiates down his right leg and "feels like it's on fire" worsening x 3 days. He has taken Meloxicam with relief in the past and would like a refill. States he does not want steroids at this time. Denies recent trauma, hyperextension/flexion of back, bladder incontinence, paresthesias, fever/chills. He stands for extended periods of time and bends his back frequently while at work. History of hypertension.  Relevant past medical, surgical, family and social history reviewed and updated as indicated. Interim medical history since our last visit reviewed. Allergies and medications reviewed and updated.  Review of Systems  Constitutional: Negative.   HENT: Negative.   Respiratory: Negative.   Cardiovascular: Negative.   Gastrointestinal: Negative.   Genitourinary: Negative.  Negative for difficulty urinating.  Musculoskeletal: Positive for back pain. Negative for arthralgias, joint swelling, myalgias, neck pain and neck stiffness.  Neurological: Negative.  Negative for weakness and numbness.   Per HPI unless specifically indicated above    Objective:    BP 135/82   Pulse (!) 58   Temp 98.4 F (36.9 C)   Wt 185 lb (83.9 kg)   SpO2 100%   BMI 27.32 kg/m   Wt Readings from Last 3 Encounters:  11/18/16 185 lb (83.9 kg)  03/05/16 170 lb 8 oz (77.3 kg)  12/12/15 167 lb (75.8 kg)    Physical Exam  Constitutional: He is oriented to  person, place, and time. He appears well-developed and well-nourished. No distress.  HENT:  Head: Normocephalic and atraumatic.  Eyes: Conjunctivae are normal. Right eye exhibits no discharge. Left eye exhibits no discharge.  Neck: Normal range of motion. Neck supple.  Cardiovascular: Intact distal pulses.   Pulmonary/Chest: Effort normal. No respiratory distress.  Abdominal: Soft. Bowel sounds are normal. He exhibits no distension. There is no tenderness.  Musculoskeletal: Normal range of motion.  No spinal or paraspinal tenderness to palpation. No step offs. Full ROM. Negative straight leg raise.  Neurological: He is alert and oriented to person, place, and time.  Skin: Skin is warm and dry. No rash noted.  Psychiatric: He has a normal mood and affect. His behavior is normal. Judgment and thought content normal.      Assessment & Plan:   Problem List Items Addressed This Visit      Other   Low back pain - Primary    Prescribe Mobic and Flexeril. Counseled on epsom salt baths and stretching exercises. BMP in clinic to evaluate kidney functioning.      Relevant Medications   meloxicam (MOBIC) 15 MG tablet   cyclobenzaprine (FLEXERIL) 10 MG tablet   Other Relevant Orders   Basic Metabolic Panel (BMET) (Completed)       Follow up plan: Return if symptoms worsen or fail to improve.

## 2016-11-18 NOTE — Assessment & Plan Note (Signed)
Prescribe Mobic and Flexeril. Counseled on epsom salt baths and stretching exercises. BMP in clinic to evaluate kidney functioning. 

## 2016-11-19 LAB — BASIC METABOLIC PANEL
BUN/Creatinine Ratio: 18 (ref 9–20)
BUN: 19 mg/dL (ref 6–24)
CALCIUM: 9.6 mg/dL (ref 8.7–10.2)
CO2: 26 mmol/L (ref 18–29)
CREATININE: 1.07 mg/dL (ref 0.76–1.27)
Chloride: 103 mmol/L (ref 96–106)
GFR calc Af Amer: 90 mL/min/{1.73_m2} (ref >59)
GFR calc non Af Amer: 78 mL/min/{1.73_m2} (ref >59)
GLUCOSE: 108 mg/dL — AB (ref 65–99)
Potassium: 5.2 mmol/L (ref 3.5–5.2)
SODIUM: 142 mmol/L (ref 134–144)

## 2017-03-21 ENCOUNTER — Telehealth: Payer: Self-pay | Admitting: Family Medicine

## 2017-03-21 NOTE — Telephone Encounter (Signed)
Needs appt

## 2017-03-21 NOTE — Telephone Encounter (Signed)
Patient stated he is having trouble sleeping. Patient would like to try getting back on his medication he was prescribed with his previous doctor for clonazepam. Patient's last visit was 11/18/16. Informed patient provider may request to see him again.  Please Advise.  Thank you

## 2017-03-21 NOTE — Telephone Encounter (Signed)
Patient notified. He states he will call back to schedule an appt. Appt needs to be with his PCP.

## 2017-03-21 NOTE — Telephone Encounter (Signed)
Routing to provider. No follow up on file. 

## 2017-04-04 ENCOUNTER — Telehealth: Payer: Self-pay | Admitting: Cardiovascular Disease

## 2017-04-04 NOTE — Telephone Encounter (Signed)
Spoke w/pt.  He reports that his sx are not really chest pain, but a tightness w/ strenuous activity. He denies SOB, sweating or n/v. Pt does not feel that his sx are emergent, he states that he called to make an appt and they sent message to triage b/c he mentioned cp. He does report increased APCs that are bothersome.  He would like to discuss adjusting/chagning BBs to see if this will help. He reports that sx are worse at night and he can feel them when he lies on his left side. He has been on Plavix for almost 3 yrs and would like to stop this if possible.  He is sched to see Dr. Mariah MillingGollan on 8/21, but I have added him to my wait list to call in the event of a cancellation.  He is overdue for 1 yr f/u. Asked him to call back if sx worsen or continue.

## 2017-04-04 NOTE — Telephone Encounter (Signed)
Pt c/o of Chest Pain: STAT if CP now or developed within 24 hours  1. Are you having CP right now? Not right now  2. Are you experiencing any other symptoms (ex. SOB, nausea, vomiting, sweating)? Extra heartbeats, tightness in chest when exerting himself  3. How long have you been experiencing CP? Probably a month   4. Is your CP continuous or coming and going? Coming and going  5. Have you taken Nitroglycerin? No, just beta blocker and baby aspirins.   Pt scheduled for 8/21 ?

## 2017-04-04 NOTE — Telephone Encounter (Signed)
Left message for pt to call back  °

## 2017-04-05 ENCOUNTER — Ambulatory Visit (INDEPENDENT_AMBULATORY_CARE_PROVIDER_SITE_OTHER): Payer: Managed Care, Other (non HMO) | Admitting: Cardiovascular Disease

## 2017-04-05 ENCOUNTER — Encounter: Payer: Self-pay | Admitting: Cardiovascular Disease

## 2017-04-05 VITALS — BP 132/76 | HR 56 | Ht 69.0 in | Wt 182.8 lb

## 2017-04-05 DIAGNOSIS — I209 Angina pectoris, unspecified: Secondary | ICD-10-CM | POA: Diagnosis not present

## 2017-04-05 DIAGNOSIS — E785 Hyperlipidemia, unspecified: Secondary | ICD-10-CM

## 2017-04-05 DIAGNOSIS — R0789 Other chest pain: Secondary | ICD-10-CM

## 2017-04-05 DIAGNOSIS — I2510 Atherosclerotic heart disease of native coronary artery without angina pectoris: Secondary | ICD-10-CM | POA: Diagnosis not present

## 2017-04-05 DIAGNOSIS — F419 Anxiety disorder, unspecified: Secondary | ICD-10-CM | POA: Diagnosis not present

## 2017-04-05 DIAGNOSIS — I10 Essential (primary) hypertension: Secondary | ICD-10-CM

## 2017-04-05 DIAGNOSIS — R030 Elevated blood-pressure reading, without diagnosis of hypertension: Secondary | ICD-10-CM | POA: Diagnosis not present

## 2017-04-05 NOTE — Telephone Encounter (Signed)
Pt sched to see Dr. Mariah MillingGollan today @ 4:20.

## 2017-04-05 NOTE — Progress Notes (Signed)
Patient ID: Jeff Fields, male   DOB: 10/16/1960, 56 y.o.   MRN: [id number] Cardiology Office Note  Date:  04/05/2017   ID:  Jeff Fields, DOB [birth year], MRN [id number]  PCP:  Dorcas Carrow, DO   Chief Complaint  Patient presents with  . other    12 month follow up. Patient c/o chest tightness with excertion. Meds reviewed verbally with patient.     HPI:  Jeff Fields is a 56 year old gentleman with History of remote smoking history,  initially presenting with chest pain on exertion,  stress test that showed anterior wall ischemia, cardiac catheterization and stent placement to his mid LAD 11/08/14.  He presents today for follow-up of his coronary artery disease  In follow-up today he reports having had 2 or 3 episodes of vague discomfort in his upper chest, sternal notch area. Seem to happen when he was pushing heavy items at work Has not had other episodes with exertion Feels different from previous anginal symptoms Otherwise feels well with no complaints except for insomnia  Tolerating low-dose Crestor daily 5 mg  Previous lab work reviewed LDL 59 Work lipids, not available  EKG on today's visit shows normal sinus rhythm, no significant ST or T-wave changes   other past medical history Previous stress test showed anterior wall ischemia seen in both non-attenuation corrected and attenuation corrected images. Wall motion abnormality to the anterior and anteroseptal wall  Father diagnosed with testicular cancer requiring treatment. Patient found a lump on his testicle and after significant workup, this was found to be a benign finding  PMH:   has a past medical history of Coronary artery disease; Hyperlipidemia; and Hypertension. Also history of coronary artery disease, prior stent  PSH:    Past Surgical History:  Procedure Laterality Date  . CARDIAC CATHETERIZATION  11/08/2014  . CORONARY ANGIOPLASTY WITH STENT PLACEMENT  11/08/2014   drug eluting stent placement to the mid  LAD  . VASECTOMY  2000    Current Outpatient Prescriptions  Medication Sig Dispense Refill  . aspirin 81 MG tablet Take 81 mg by mouth daily.    . clopidogrel (PLAVIX) 75 MG tablet TAKE 1 TABLET BY MOUTH DAILY 90 tablet 3  . cyclobenzaprine (FLEXERIL) 10 MG tablet Take 1 tablet (10 mg total) by mouth 3 (three) times daily as needed for muscle spasms. 30 tablet 0  . meloxicam (MOBIC) 15 MG tablet Take 1 tablet (15 mg total) by mouth daily. 30 tablet 0  . metoprolol tartrate (LOPRESSOR) 25 MG tablet TAKE ONE TABLET BY MOUTH TWICE DAILY 180 tablet 3  . rosuvastatin (CRESTOR) 5 MG tablet TAKE 1 TABLET BY MOUTH DAILY 90 tablet 3   No current facility-administered medications for this visit.      Allergies:   Patient has no known allergies.   Social History:  The patient  reports that he quit smoking about 28 years ago. His smoking use included Cigarettes. He smoked 1.00 pack per day. He has never used smokeless tobacco. He reports that he drinks alcohol. He reports that he does not use drugs.   Family History:   family history includes Arrhythmia in his father; Arthritis in his brother; Hypertension in his brother, father, and mother; Lung cancer in his maternal grandfather; Stroke in his maternal grandmother.    Review of Systems: Review of Systems  Constitutional: Negative.   Respiratory: Negative.   Cardiovascular: Positive for chest pain.  Gastrointestinal: Negative.   Musculoskeletal: Negative.   Skin: Negative.  Neurological: Negative.   Psychiatric/Behavioral: Negative.   All other systems reviewed and are negative.    PHYSICAL EXAM: VS:  BP 132/76 (BP Location: Left Arm, Patient Position: Sitting, Cuff Size: Normal)   Pulse (!) 56   Ht 5\' 9"  (1.753 m)   Wt 182 lb 12 oz (82.9 kg)   BMI 26.99 kg/m  , BMI Body mass index is 26.99 kg/m.  GEN: Well nourished, well developed, in no acute distress  HEENT: normal  Neck: no JVD, carotid bruits, or masses Cardiac: RRR; no  murmurs, rubs, or gallops,no edema  Respiratory:  clear to auscultation bilaterally, normal work of breathing GI: soft, nontender, nondistended, + BS MS: no deformity or atrophy  Skin: warm and dry, no rash Neuro:  Strength and sensation are intact Psych: euthymic mood, full affect    Recent Labs: 11/18/2016: BUN 19; Creatinine, Ser 1.07; Potassium 5.2; Sodium 142    Lipid Panel Lab Results  Component Value Date   CHOL 191 09/20/2014   HDL 31 (A) 09/20/2014   TRIG 148 09/20/2014      Wt Readings from Last 3 Encounters:  04/05/17 182 lb 12 oz (82.9 kg)  11/18/16 185 lb (83.9 kg)  03/05/16 170 lb 8 oz (77.3 kg)       ASSESSMENT AND PLAN:  Irregular heart beat - Plan: EKG 12-Lead No further episodes of palpitations  Coronary artery disease involving native coronary artery of native heart without angina pectoris Vague discomfort, upper chest, sternal notch area. Likely atypical in nature We have recommended routine treadmill testing  Hyperlipidemia Tolerating low-dose Crestor 5 mg daily Prior LDL 59. He had lab work at work and we'll try to get a copy for our records  Essential hypertension Blood pressure is well controlled on today's visit. No changes made to the medications.  S/P coronary artery stent placement Encouraged him to stay on his aspirin  Long discussion concerning whether he should stay on Plavix Suggested that he could stop the Plavix at this time if he would like He denies any excessive bruising or any bleeding   Total encounter time more than 25 minutes  Greater than 50% was spent in counseling and coordination of care with the patient   Disposition:   F/U  12 months   Orders Placed This Encounter  Procedures  . EKG 12-Lead     Signed, Dossie Arbourim Baraa Tubbs, M.D., Ph.D. 04/05/2017  [facility], [place] [phone]

## 2017-04-05 NOTE — Patient Instructions (Addendum)
Goal LDL <70,  Total chol <150   Medication Instructions:   No medication changes made  Labwork:  No new labs needed  Testing/Procedures:  We will order a routine treadmill stress test  Please do not take your metoprolol the night before or morning of your procedure. Make sure you wear athletic shoes.   Follow-Up: It was a pleasure seeing you in the office today. Please call us if you have new issues that need to be addressed before your next appt.  [phone]  Your physician wants you to follow-up in: 12 months.  You will receive a reminder letter in the mail two months in advance. If you don't receive a letter, please call our office to schedule the follow-up appointment.  If you need a refill on your cardiac medications before your next appointment, please call your pharmacy.     Exercise Stress Electrocardiogram An exercise stress electrocardiogram is a test to check how blood flows to your heart. It is done to find areas of poor blood flow. You will need to walk on a treadmill for this test. The electrocardiogram will record your heartbeat when you are at rest and when you are exercising. What happens before the procedure?  Do not have drinks with caffeine or foods with caffeine for 24 hours before the test, or as told by your doctor. This includes coffee, tea (even decaf tea), sodas, chocolate, and cocoa.  Follow your doctor's instructions about eating and drinking before the test.  Ask your doctor what medicines you should or should not take before the test. Take your medicines with water unless told by your doctor not to.  If you use an inhaler, bring it with you to the test.  Bring a snack to eat after the test.  Do not  smoke for 4 hours before the test.  Do not put lotions, powders, creams, or oils on your chest before the test.  Wear comfortable shoes and clothing. What happens during the procedure?  You will have patches put on your chest. Small  areas of your chest may need to be shaved. Wires will be connected to the patches.  Your heart rate will be watched while you are resting and while you are exercising.  You will walk on the treadmill. The treadmill will slowly get faster to raise your heart rate.  The test will take about 1-2 hours. What happens after the procedure?  Your heart rate and blood pressure will be watched after the test.  You may return to your normal diet, activities, and medicines or as told by your doctor. This information is not intended to replace advice given to you by your health care provider. Make sure you discuss any questions you have with your health care provider. Document Released: 02/16/2008 Document Revised: 04/28/2016 Document Reviewed: 05/07/2013 Elsevier Interactive Patient Education  [employer]2018 [employer]

## 2017-04-16 ENCOUNTER — Other Ambulatory Visit: Payer: Self-pay | Admitting: Family Medicine

## 2017-04-18 NOTE — Telephone Encounter (Signed)
Routing to provider. No follow up on file. 

## 2017-05-02 ENCOUNTER — Other Ambulatory Visit: Payer: Self-pay | Admitting: Family Medicine

## 2017-05-02 NOTE — Telephone Encounter (Signed)
Your patient 

## 2017-05-03 ENCOUNTER — Ambulatory Visit: Payer: Managed Care, Other (non HMO) | Admitting: Cardiovascular Disease

## 2017-05-10 ENCOUNTER — Encounter: Payer: Self-pay | Admitting: Family Medicine

## 2017-05-10 ENCOUNTER — Ambulatory Visit (INDEPENDENT_AMBULATORY_CARE_PROVIDER_SITE_OTHER): Payer: Managed Care, Other (non HMO) | Admitting: Family Medicine

## 2017-05-10 VITALS — BP 132/70 | HR 57 | Temp 97.9°F | Wt 184.2 lb

## 2017-05-10 DIAGNOSIS — G479 Sleep disorder, unspecified: Secondary | ICD-10-CM

## 2017-05-10 DIAGNOSIS — Z1211 Encounter for screening for malignant neoplasm of colon: Secondary | ICD-10-CM

## 2017-05-10 DIAGNOSIS — E785 Hyperlipidemia, unspecified: Secondary | ICD-10-CM | POA: Diagnosis not present

## 2017-05-10 DIAGNOSIS — M62830 Muscle spasm of back: Secondary | ICD-10-CM

## 2017-05-10 DIAGNOSIS — I10 Essential (primary) hypertension: Secondary | ICD-10-CM

## 2017-05-10 DIAGNOSIS — F419 Anxiety disorder, unspecified: Secondary | ICD-10-CM | POA: Diagnosis not present

## 2017-05-10 LAB — UA/M W/RFLX CULTURE, ROUTINE
Bilirubin, UA: NEGATIVE
GLUCOSE, UA: NEGATIVE
Ketones, UA: NEGATIVE
LEUKOCYTES UA: NEGATIVE
NITRITE UA: NEGATIVE
PROTEIN UA: NEGATIVE
RBC UA: NEGATIVE
SPEC GRAV UA: 1.015 (ref 1.005–1.030)
UUROB: 0.2 mg/dL (ref 0.2–1.0)
pH, UA: 7.5 (ref 5.0–7.5)

## 2017-05-10 LAB — MICROSCOPIC EXAMINATION: BACTERIA UA: NONE SEEN

## 2017-05-10 LAB — MICROALBUMIN, URINE WAIVED
Creatinine, Urine Waived: 200 mg/dL (ref 10–300)
Microalb, Ur Waived: 10 mg/L (ref 0–19)
Microalb/Creat Ratio: <30 mg/g (ref <30)

## 2017-05-10 MED ORDER — MELOXICAM 15 MG PO TABS: 15.0000 mg | ORAL_TABLET | Freq: Every day | ORAL | 1 refills | Status: DC

## 2017-05-10 MED ORDER — CLONAZEPAM 0.5 MG PO TABS: 0.5000 mg | ORAL_TABLET | Freq: Every day | ORAL | 1 refills | Status: DC

## 2017-05-10 NOTE — Assessment & Plan Note (Signed)
Not under good control. Will give short term supply of klonopin for sleep. If not improving- will return and consider preventative medication for anxiety.

## 2017-05-10 NOTE — Assessment & Plan Note (Signed)
Rechecking levels today. Continue current regimen. Continue to monitor.  

## 2017-05-10 NOTE — Progress Notes (Signed)
BP 132/70   Pulse (!) 57   Temp 97.9 F (36.6 C)   Wt 184 lb 4 oz (83.6 kg)   SpO2 99%   BMI 27.21 kg/m    Subjective:    Patient ID: Jeff Fields, male    DOB: 12-24-1960, 56 y.o.   MRN: [id number]  HPI: Jeff Fields is a 56 y.o. male  Chief Complaint  Patient presents with  . Insomnia  . Back Pain   HYPERTENSION / HYPERLIPIDEMIA Satisfied with current treatment? yes Duration of hypertension: chronic BP monitoring frequency: not checking BP medication side effects: no Past BP meds: metoprolol Duration of hyperlipidemia: chronic Cholesterol medication side effects: no Cholesterol supplements: none Past cholesterol medications: crestor Medication compliance: good compliance Aspirin: yes Recent stressors: yes Recurrent headaches: no Visual changes: no Palpitations: no Dyspnea: no Chest pain: no Lower extremity edema: no Dizzy/lightheaded: no  BACK PAIN- has wrenched it a couple of times Duration: weeks Mechanism of injury: lifting Location: bilateral and low back Onset: sudden Severity: moderate Quality: dull, aching and throbbing Frequency: intermittent Radiation: none Aggravating factors: lifting and movement Alleviating factors: rest, ice, heat, laying, NSAIDs, APAP and muscle relaxer Status: better Treatments attempted: rest, ice, heat, APAP, ibuprofen, aleve and physical therapy  Relief with NSAIDs?: moderate Nighttime pain:  yes Paresthesias / decreased sensation:  yes Bowel / bladder incontinence:  no Fevers:  no Dysuria / urinary frequency:  no  INSOMNIA- has been having some trouble with sleep recently for a couple of weeks- usually goes to bed about 10-10:30 using good sleep hygeine, usually able to sleep starting about 1AM, last night didn't fall asleep at all. He notes that he has had to miss work because of it. Has been having racing thoughts, can't seem to quiet his mind Duration: couple of weeks Satisfied with sleep quality: yes Difficulty  falling asleep: yes Difficulty staying asleep: no Waking a few hours after sleep onset: no Early morning awakenings: yes Daytime hypersomnolence: yes Wakes feeling refreshed: no Good sleep hygiene: yes Apnea: no Snoring: yes Depressed/anxious mood: yes Recent stress: yes Restless legs/nocturnal leg cramps: no Chronic pain/arthritis: no History of sleep study: no Treatments attempted: ambien- klonopin in the past with good results, benadryl, melatonin     Relevant past medical, surgical, family and social history reviewed and updated as indicated. Interim medical history since our last visit reviewed. Allergies and medications reviewed and updated.  Review of Systems  Constitutional: Negative.   Respiratory: Negative.   Cardiovascular: Negative.   Musculoskeletal: Positive for back pain and myalgias. Negative for arthralgias, gait problem, joint swelling, neck pain and neck stiffness.  Psychiatric/Behavioral: Positive for sleep disturbance. Negative for agitation, behavioral problems, confusion, decreased concentration, dysphoric mood, hallucinations, self-injury and suicidal ideas. The patient is not nervous/anxious and is not hyperactive.     Per HPI unless specifically indicated above     Objective:    BP 132/70   Pulse (!) 57   Temp 97.9 F (36.6 C)   Wt 184 lb 4 oz (83.6 kg)   SpO2 99%   BMI 27.21 kg/m   Wt Readings from Last 3 Encounters:  05/10/17 184 lb 4 oz (83.6 kg)  04/05/17 182 lb 12 oz (82.9 kg)  11/18/16 185 lb (83.9 kg)    Physical Exam  Constitutional: He is oriented to person, place, and time. He appears well-developed and well-nourished. No distress.  HENT:  Head: Normocephalic and atraumatic.  Right Ear: Hearing normal.  Left Ear: Hearing normal.  Nose: Nose normal.  Eyes: Conjunctivae and lids are normal. Right eye exhibits no discharge. Left eye exhibits no discharge. No scleral icterus.  Cardiovascular: Normal rate, regular rhythm, normal  heart sounds and intact distal pulses.  Exam reveals no gallop and no friction rub.   No murmur heard. Pulmonary/Chest: Effort normal and breath sounds normal. No respiratory distress. He has no wheezes. He has no rales. He exhibits no tenderness.  Musculoskeletal: Normal range of motion.  Neurological: He is alert and oriented to person, place, and time.  Skin: Skin is warm, dry and intact. No rash noted. He is not diaphoretic. No erythema. No pallor.  Psychiatric: He has a normal mood and affect. His speech is normal and behavior is normal. Judgment and thought content normal. Cognition and memory are normal.  Nursing note and vitals reviewed.   Results for orders placed or performed in visit on 11/18/16  Basic Metabolic Panel (BMET)  Result Value Ref Range   Glucose 108 (H) 65 - 99 mg/dL   BUN 19 6 - 24 mg/dL   Creatinine, Ser 1.59 0.76 - 1.27 mg/dL   GFR calc non Af Amer 78 >59 mL/min/1.73   GFR calc Af Amer 90 >59 mL/min/1.73   BUN/Creatinine Ratio 18 9 - 20   Sodium 142 134 - 144 mmol/L   Potassium 5.2 3.5 - 5.2 mmol/L   Chloride 103 96 - 106 mmol/L   CO2 26 18 - 29 mmol/L   Calcium 9.6 8.7 - 10.2 mg/dL      Assessment & Plan:   Problem List Items Addressed This Visit      Cardiovascular and Mediastinum   Benign hypertension - Primary    Under better control on recheck. Continue current regimen. Continue to monitor. Call with any concerns.       Relevant Orders   Comprehensive metabolic panel   Microalbumin, Urine Waived   TSH   UA/M w/rflx Culture, Routine     Other   Hyperlipidemia    Rechecking levels today. Continue current regimen. Continue to monitor.       Relevant Orders   Comprehensive metabolic panel   Lipid Panel w/o Chol/HDL Ratio   TSH   Anxiety    Not under good control. Will give short term supply of klonopin for sleep. If not improving- will return and consider preventative medication.       Back muscle spasm    Strongly advised against  taking 3 flexeril at a time. Will not refill that Rx today. Will work on stretches and take meloxicam. If not improving, will get into PT.       Sleep disturbance    Not under good control. Will give short term supply of klonopin for sleep. If not improving- will return and consider preventative medication for anxiety.      Relevant Orders   CBC with Differential/Platelet   Comprehensive metabolic panel   TSH    Other Visit Diagnoses    Screening for colon cancer       Referral to GI made today.   Relevant Orders   Ambulatory referral to Gastroenterology       Follow up plan: Return in about 4 weeks (around 06/07/2017) for follow up sleep/anxiety and back.

## 2017-05-10 NOTE — Assessment & Plan Note (Signed)
Not under good control. Will give short term supply of klonopin for sleep. If not improving- will return and consider preventative medication.

## 2017-05-10 NOTE — Assessment & Plan Note (Signed)
Strongly advised against taking 3 flexeril at a time. Will not refill that Rx today. Will work on stretches and take meloxicam. If not improving, will get into PT.

## 2017-05-10 NOTE — Patient Instructions (Addendum)

## 2017-05-10 NOTE — Assessment & Plan Note (Signed)
Under better control on recheck. Continue current regimen. Continue to monitor. Call with any concerns.

## 2017-05-11 ENCOUNTER — Ambulatory Visit: Payer: Managed Care, Other (non HMO) | Admitting: Family Medicine

## 2017-05-11 LAB — CBC WITH DIFFERENTIAL/PLATELET
BASOS ABS: 0 10*3/uL (ref 0.0–0.2)
Basos: 0 %
EOS (ABSOLUTE): 0.2 10*3/uL (ref 0.0–0.4)
EOS: 2 %
Hematocrit: 37.9 % (ref 37.5–51.0)
Hemoglobin: 13.2 g/dL (ref 13.0–17.7)
Immature Grans (Abs): 0 10*3/uL (ref 0.0–0.1)
Immature Granulocytes: 0 %
Lymphocytes Absolute: 1.7 10*3/uL (ref 0.7–3.1)
Lymphs: 25 %
MCH: 31.8 pg (ref 26.6–33.0)
MCHC: 34.8 g/dL (ref 31.5–35.7)
MCV: 91 fL (ref 79–97)
Monocytes Absolute: 0.5 10*3/uL (ref 0.1–0.9)
Monocytes: 7 %
NEUTROS PCT: 66 %
Neutrophils Absolute: 4.4 10*3/uL (ref 1.4–7.0)
Platelets: 178 10*3/uL (ref 150–379)
RBC: 4.15 x10E6/uL (ref 4.14–5.80)
RDW: 13.3 % (ref 12.3–15.4)
WBC: 6.7 10*3/uL (ref 3.4–10.8)

## 2017-05-11 LAB — COMPREHENSIVE METABOLIC PANEL
A/G RATIO: 1.9 (ref 1.2–2.2)
ALT: 19 IU/L (ref 0–44)
AST: 20 IU/L (ref 0–40)
Albumin: 4.7 g/dL (ref 3.5–5.5)
Alkaline Phosphatase: 71 IU/L (ref 39–117)
BILIRUBIN TOTAL: 1 mg/dL (ref 0.0–1.2)
BUN / CREAT RATIO: 12 (ref 9–20)
BUN: 15 mg/dL (ref 6–24)
CHLORIDE: 104 mmol/L (ref 96–106)
CO2: 24 mmol/L (ref 20–29)
Calcium: 9.6 mg/dL (ref 8.7–10.2)
Creatinine, Ser: 1.24 mg/dL (ref 0.76–1.27)
GFR, EST AFRICAN AMERICAN: 75 mL/min/{1.73_m2} (ref >59)
GFR, EST NON AFRICAN AMERICAN: 65 mL/min/{1.73_m2} (ref >59)
GLUCOSE: 97 mg/dL (ref 65–99)
Globulin, Total: 2.5 g/dL (ref 1.5–4.5)
POTASSIUM: 4.7 mmol/L (ref 3.5–5.2)
Sodium: 142 mmol/L (ref 134–144)
Total Protein: 7.2 g/dL (ref 6.0–8.5)

## 2017-05-11 LAB — LIPID PANEL W/O CHOL/HDL RATIO
Cholesterol, Total: 117 mg/dL (ref 100–199)
HDL: 46 mg/dL (ref >39)
LDL Calculated: 54 mg/dL (ref 0–99)
Triglycerides: 86 mg/dL (ref 0–149)
VLDL CHOLESTEROL CAL: 17 mg/dL (ref 5–40)

## 2017-05-11 LAB — TSH: TSH: 2.29 u[IU]/mL (ref 0.450–4.500)

## 2017-06-09 ENCOUNTER — Ambulatory Visit: Payer: Managed Care, Other (non HMO) | Admitting: Family Medicine

## 2017-07-22 ENCOUNTER — Ambulatory Visit: Payer: Self-pay | Admitting: Family Medicine

## 2017-07-25 ENCOUNTER — Ambulatory Visit: Payer: Self-pay

## 2017-07-25 NOTE — Telephone Encounter (Signed)
   Reason for Disposition . [1] MILD pain (e.g., does not interfere with normal activities) AND [2] pain comes and goes (cramps) [3] present > 48 hours  Answer Assessment - Initial Assessment Questions 1. LOCATION: "Where does it hurt?"      Left lower quadrant of abdomin 2. RADIATION: "Does the pain shoot anywhere else?" (e.g., chest, back)     No 3. ONSET: "When did the pain begin?" (Minutes, hours or days ago)      Last Thursday; disturbed sleep 4. SUDDEN: "Gradual or sudden onset?"     Gradual 5. PATTERN "Does the pain come and go, or is it constant?"    - If constant: "Is it getting better, staying the same, or worsening?"      (Note: Constant means the pain never goes away completely; most serious pain is constant and it progresses)     - If intermittent: "How long does it last?" "Do you have pain now?"     (Note: Intermittent means the pain goes away completely between bouts)     Intermmittent 6. SEVERITY: "How bad is the pain?"  (e.g., Scale 1-10; mild, moderate, or severe)    - MILD (1-3): doesn't interfere with normal activities, abdomen soft and not tender to touch     - MODERATE (4-7): interferes with normal activities or awakens from sleep, tender to touch     - SEVERE (8-10): excruciating pain, doubled over, unable to do any normal activities       Currently 1-2 7. RECURRENT SYMPTOM: "Have you ever had this type of abdominal pain before?" If so, ask: "When was the last time?" and "What happened that time?"      Similar - "came and went." 8. CAUSE: "What do you think is causing the abdominal pain?"     Unsure 9. RELIEVING/AGGRAVATING FACTORS: "What makes it better or worse?" (e.g., movement, antacids, bowel movement)     Pressing on area;  10. OTHER SYMPTOMS: "Has there been any vomiting, diarrhea, constipation, or urine problems?"       None;No fever  Protocols used: ABDOMINAL PAIN - MALE-A-AH  Pt. Has an appointment scheduled for tomorrow. Instructed if pain  increases or he develops other symptoms - nausea,vomiting.diarrhea, fever- to call back. Verbalizes understanding.

## 2017-07-26 ENCOUNTER — Encounter: Payer: Self-pay | Admitting: Family Medicine

## 2017-07-26 ENCOUNTER — Ambulatory Visit: Payer: Managed Care, Other (non HMO) | Admitting: Family Medicine

## 2017-07-26 VITALS — BP 135/80 | HR 60 | Temp 98.1°F | Ht 69.0 in | Wt 189.1 lb

## 2017-07-26 DIAGNOSIS — R1032 Left lower quadrant pain: Secondary | ICD-10-CM | POA: Diagnosis not present

## 2017-07-26 NOTE — Patient Instructions (Signed)
Follow up as needed

## 2017-07-26 NOTE — Progress Notes (Signed)
BP 135/80 (BP Location: Left Arm, Patient Position: Sitting, Cuff Size: Normal)   Pulse 60   Temp 98.1 F (36.7 C) (Oral)   Ht 5\' 9"  (1.753 m)   Wt 189 lb 1.6 oz (85.8 kg)   SpO2 100%   BMI 27.93 kg/m    Subjective:    Patient ID: Jeff Fields, male    DOB: 01/07/1961, 56 y.o.   MRN: [id number]  HPI: Jeff ChapelMark R Sawyer is a 56 y.o. male  Chief Complaint  Patient presents with  . Abdominal Pain    LLQ pain. x's 5 days.    Tender x 5 days LLQ near belt line. No n/v/d, but did hear noises coming from that area. Has been pushing on it and messing with it and feels like that has kept it tender. Regular BMs, 1-2 times daily. Denies fevers. No hx of hernias. Has not been trying anything OTC for sxs.   Relevant past medical, surgical, family and social history reviewed and updated as indicated. Interim medical history since our last visit reviewed. Allergies and medications reviewed and updated.  Review of Systems  Constitutional: Negative.   HENT: Negative.   Respiratory: Negative.   Cardiovascular: Negative.   Gastrointestinal: Positive for abdominal pain.  Musculoskeletal: Negative.   Skin: Negative.  Negative for color change.  Neurological: Negative.   Psychiatric/Behavioral: Negative.     Per HPI unless specifically indicated above     Objective:    BP 135/80 (BP Location: Left Arm, Patient Position: Sitting, Cuff Size: Normal)   Pulse 60   Temp 98.1 F (36.7 C) (Oral)   Ht 5\' 9"  (1.753 m)   Wt 189 lb 1.6 oz (85.8 kg)   SpO2 100%   BMI 27.93 kg/m   Wt Readings from Last 3 Encounters:  07/26/17 189 lb 1.6 oz (85.8 kg)  05/10/17 184 lb 4 oz (83.6 kg)  04/05/17 182 lb 12 oz (82.9 kg)    Physical Exam  Constitutional: He is oriented to person, place, and time. He appears well-developed and well-nourished. No distress.  HENT:  Head: Atraumatic.  Eyes: Conjunctivae are normal. Pupils are equal, round, and reactive to light. No scleral icterus.  Neck: Normal range of  motion. Neck supple.  Cardiovascular: Normal rate and normal heart sounds.  Pulmonary/Chest: Effort normal and breath sounds normal. No respiratory distress.  Abdominal: Soft. Bowel sounds are normal. He exhibits no distension and no mass. There is no tenderness (minimal ttp LLQ). There is no guarding.  Musculoskeletal: Normal range of motion.  Lymphadenopathy:    He has no cervical adenopathy.  Neurological: He is alert and oriented to person, place, and time.  Skin: Skin is warm and dry.  Psychiatric: He has a normal mood and affect. His behavior is normal.  Nursing note and vitals reviewed.  Results for orders placed or performed in visit on 05/10/17  Microscopic Examination  Result Value Ref Range   WBC, UA 0-5 0 - 5 /hpf   RBC, UA 0-2 0 - 2 /hpf   Epithelial Cells (non renal) 0-10 0 - 10 /hpf   Bacteria, UA None seen None seen/Few  CBC with Differential/Platelet  Result Value Ref Range   WBC 6.7 3.4 - 10.8 x10E3/uL   RBC 4.15 4.14 - 5.80 x10E6/uL   Hemoglobin 13.2 13.0 - 17.7 g/dL   Hematocrit 91.437.9 78.237.5 - 51.0 %   MCV 91 79 - 97 fL   MCH 31.8 26.6 - 33.0 pg   MCHC 34.8  31.5 - 35.7 g/dL   RDW 14.713.3 82.912.3 - 56.215.4 %   Platelets 178 150 - 379 x10E3/uL   Neutrophils 66 Not Estab. %   Lymphs 25 Not Estab. %   Monocytes 7 Not Estab. %   Eos 2 Not Estab. %   Basos 0 Not Estab. %   Neutrophils Absolute 4.4 1.4 - 7.0 x10E3/uL   Lymphocytes Absolute 1.7 0.7 - 3.1 x10E3/uL   Monocytes Absolute 0.5 0.1 - 0.9 x10E3/uL   EOS (ABSOLUTE) 0.2 0.0 - 0.4 x10E3/uL   Basophils Absolute 0.0 0.0 - 0.2 x10E3/uL   Immature Granulocytes 0 Not Estab. %   Immature Grans (Abs) 0.0 0.0 - 0.1 x10E3/uL  Comprehensive metabolic panel  Result Value Ref Range   Glucose 97 65 - 99 mg/dL   BUN 15 6 - 24 mg/dL   Creatinine, Ser 1.301.24 0.76 - 1.27 mg/dL   GFR calc non Af Amer 65 >59 mL/min/1.73   GFR calc Af Amer 75 >59 mL/min/1.73   BUN/Creatinine Ratio 12 9 - 20   Sodium 142 134 - 144 mmol/L   Potassium  4.7 3.5 - 5.2 mmol/L   Chloride 104 96 - 106 mmol/L   CO2 24 20 - 29 mmol/L   Calcium 9.6 8.7 - 10.2 mg/dL   Total Protein 7.2 6.0 - 8.5 g/dL   Albumin 4.7 3.5 - 5.5 g/dL   Globulin, Total 2.5 1.5 - 4.5 g/dL   Albumin/Globulin Ratio 1.9 1.2 - 2.2   Bilirubin Total 1.0 0.0 - 1.2 mg/dL   Alkaline Phosphatase 71 39 - 117 IU/L   AST 20 0 - 40 IU/L   ALT 19 0 - 44 IU/L  Lipid Panel w/o Chol/HDL Ratio  Result Value Ref Range   Cholesterol, Total 117 100 - 199 mg/dL   Triglycerides 86 0 - 149 mg/dL   HDL 46 >86>39 mg/dL   VLDL Cholesterol Cal 17 5 - 40 mg/dL   LDL Calculated 54 0 - 99 mg/dL  Microalbumin, Urine Waived  Result Value Ref Range   Microalb, Ur Waived 10 0 - 19 mg/L   Creatinine, Urine Waived 200 10 - 300 mg/dL   Microalb/Creat Ratio <30 <30 mg/g  TSH  Result Value Ref Range   TSH 2.290 0.450 - 4.500 uIU/mL  UA/M w/rflx Culture, Routine  Result Value Ref Range   Specific Gravity, UA 1.015 1.005 - 1.030   pH, UA 7.5 5.0 - 7.5   Color, UA Yellow Yellow   Appearance Ur Clear Clear   Leukocytes, UA Negative Negative   Protein, UA Negative Negative/Trace   Glucose, UA Negative Negative   Ketones, UA Negative Negative   RBC, UA Negative Negative   Bilirubin, UA Negative Negative   Urobilinogen, Ur 0.2 0.2 - 1.0 mg/dL   Nitrite, UA Negative Negative   Microscopic Examination See below:       Assessment & Plan:   Problem List Items Addressed This Visit    None    Visit Diagnoses    LLQ pain    -  Primary   Seems to be resolving well on it's own, discussed heating pad,tylenol, ibuprofen if needed. Keep BMs soft and regular. F/u with any changes       Follow up plan: Return if symptoms worsen or fail to improve.

## 2017-07-31 ENCOUNTER — Other Ambulatory Visit: Payer: Self-pay | Admitting: Family Medicine

## 2017-08-13 ENCOUNTER — Other Ambulatory Visit: Payer: Self-pay | Admitting: Cardiovascular Disease

## 2017-09-27 ENCOUNTER — Telehealth: Payer: Self-pay | Admitting: Cardiovascular Disease

## 2017-09-27 ENCOUNTER — Encounter: Payer: Self-pay | Admitting: Physician Assistant

## 2017-09-27 NOTE — Telephone Encounter (Signed)
Spoke with patient and he reports that he has been feeling bad lately. He has not been sleeping well and yesterday he was at work and he developed a feeling that came over him where he felt flushed and felt pressure to his chest with some left arm tingling. He states the flushed filling lasted only minutes but the pressure persisted. He states that he took some over the counter reflux medication with no change in symptoms. Patient has cardiac history with stent placement and reviewed signs and symptoms to monitor which would require immediate evaluation in the ED. Advised that I would have someone call him to schedule appointment. He verbalized understanding with no further questions at this time.

## 2017-09-27 NOTE — Telephone Encounter (Signed)
Pt c/o of Chest Pain: STAT if CP now or developed within 24 hours  1. Are you having CP right now? no  2. Are you experiencing any other symptoms (ex. SOB, nausea, vomiting, sweating)? No just pressure, has has some reflux issues lately  3. How long have you been experiencing CP? Yesterday   4. Is your CP continuous or coming and going? Comes and goes tightness  5. Have you taken Nitroglycerin? no ?

## 2017-09-27 NOTE — Telephone Encounter (Signed)
Spoke with patient and confirmed appointment for tomorrow here in our office. He had no further questions or concerns at this time.

## 2017-09-27 NOTE — Progress Notes (Signed)
Cardiology Office Note Date:  09/28/2017  Patient ID:  [name], Fields 09/14/1960, MRN [id number] PCP:  Dorcas Carrow, DO  Cardiologist:  Dr. Mariah Milling, MD    Chief Complaint: chest pain  History of Present Illness: Jeff Fields is a 57 y.o. male with history of CAD s/p stenting to the mid LAD in 10/2014, HTN, HLD and remote tobacco abuse who presents for evaluation of chest pain.  Patient presented with chest pain in 10/2014. Underwent nuclear stress test that showed a moderate region of severe ischemia in the anterior and anteroseptal territory. EF 64%. This was a moderate risk study. He underwent LHC 11/08/2014 that showed mid LAD 99% stenosis s/p PCI/DES, 40% stenosis in the proximal LAD, D1 40% stenosis at the ostium of the vessel, EF > 55%. He was most recently seen on 04/05/2017 and noted 2-3 episodes of vague discomfort in his upper chest, sternal notch region. This discomfort appeared to happen when he was pushing heavy items at work. Felt different than his prior pain leading to his PCI in 10/2014. Treadmill stress test was recommended, though it appears the patient did not follow through with this.   He called the office on 09/27/17 with onset of flushing and chest pressure. Appointment was scheduled for today.   Patient notes while at work on 09/26/17, he was standing and developed sudden onset of chest pressure/tightness with associated flushing. Flushing lasted ~ 30 seconds and self resolved. He has continued to note a mild chest tightness since. Symptoms not exertional. No associated palpitations, diaphoresis, nausea, vomiting, dizziness, presyncope, or syncope. Flushing sensation felt similar to when he had his stent deployed in 2016. Symptoms did not feel similar to pain leading up to his stress test and cath in 10/2014. Also notes significant fatigue leading up to these events that he has attributed to difficulty staying asleep and falling back asleep when he wakes up at nighttime. Took a  Klonopin the night prior to help him sleep, reports this did help some. Continues to note mild chest tightness at this time. Compliant with medications. BP at home has been running on the higher side lately as well with systolic readings on 09/27/17 in the 140s mmHg. Not eating a heart healthy diet at this time. No orthopnea, lower extremity swelling, PND, or cough. Weight up 8 pounds from 03/2017 visit.   Most recent lipid panel from 04/2017 showed an LDL of 54.     Past Medical History:  Diagnosis Date  . Coronary artery disease    a. LHC 10/2014: mid LAD 99% stenosis s/p PCI/DES, 40% stenosis in the proximal LAD, D1 40% stenosis at the ostium of the vessel, EF > 55%  . Hyperlipidemia   . Hypertension     Past Surgical History:  Procedure Laterality Date  . CARDIAC CATHETERIZATION  11/08/2014  . CORONARY ANGIOPLASTY WITH STENT PLACEMENT  11/08/2014   drug eluting stent placement to the mid LAD  . VASECTOMY  2000    Current Meds  Medication Sig  . aspirin 81 MG tablet Take 81 mg by mouth daily.  . clonazePAM (KLONOPIN) 0.5 MG tablet Take 1 tablet (0.5 mg total) by mouth at bedtime.  . Coenzyme Q10 (COQ-10) 200 MG CAPS Take 200 mg by mouth daily.  . meloxicam (MOBIC) 15 MG tablet Take 1 tablet (15 mg total) by mouth daily.  . metoprolol tartrate (LOPRESSOR) 25 MG tablet TAKE ONE TABLET BY MOUTH TWICE DAILY  . rosuvastatin (CRESTOR) 5 MG tablet  TAKE 1 TABLET BY MOUTH DAILY    Allergies:   Patient has no known allergies.   Social History:  The patient  reports that he quit smoking about 29 years ago. His smoking use included cigarettes. He smoked 1.00 pack per day. he has never used smokeless tobacco. He reports that he drinks alcohol. He reports that he does not use drugs.   Family History:  The patient's family history includes Arrhythmia in his father; Arthritis in his brother; Hypertension in his brother, father, and mother; Lung cancer in his maternal grandfather; Stroke in his  maternal grandmother.  ROS:   Review of Systems  Constitutional: Positive for malaise/fatigue. Negative for chills, diaphoresis, fever and weight loss.  HENT: Negative for congestion.   Eyes: Negative for discharge and redness.  Respiratory: Negative for cough, hemoptysis, sputum production, shortness of breath and wheezing.   Cardiovascular: Positive for chest pain. Negative for palpitations, orthopnea, claudication, leg swelling and PND.  Gastrointestinal: Positive for heartburn. Negative for abdominal pain, blood in stool, melena, nausea and vomiting.  Genitourinary: Negative for hematuria.  Musculoskeletal: Negative for falls and myalgias.  Skin: Negative for rash.  Neurological: Positive for weakness. Negative for dizziness, tingling, tremors, sensory change, speech change, focal weakness and loss of consciousness.  Endo/Heme/Allergies: Does not bruise/bleed easily.  Psychiatric/Behavioral: Negative for substance abuse. The patient has insomnia. The patient is not nervous/anxious.   All other systems reviewed and are negative.    PHYSICAL EXAM:  VS:  BP (!) 150/90 (BP Location: Left Arm, Patient Position: Sitting, Cuff Size: Normal)   Pulse (!) 57   Ht 5\' 9"  (1.753 m)   Wt 190 lb 8 oz (86.4 kg)   BMI 28.13 kg/m  BMI: Body mass index is 28.13 kg/m.  Physical Exam  Constitutional: He is oriented to person, place, and time. He appears well-developed and well-nourished.  HENT:  Head: Normocephalic and atraumatic.  Eyes: Right eye exhibits no discharge. Left eye exhibits no discharge.  Neck: Normal range of motion. No JVD present.  Cardiovascular: Normal rate, regular rhythm, S1 normal, S2 normal and normal heart sounds. Exam reveals no distant heart sounds, no friction rub, no midsystolic click and no opening snap.  No murmur heard. Pulses:      Posterior tibial pulses are 2+ on the right side, and 2+ on the left side.  Pulmonary/Chest: Effort normal and breath sounds normal.  No respiratory distress. He has no decreased breath sounds. He has no wheezes. He has no rales. He exhibits no tenderness.  Abdominal: Soft. He exhibits no distension. There is no tenderness.  Musculoskeletal: He exhibits no edema.  Neurological: He is alert and oriented to person, place, and time.  Skin: Skin is warm and dry. No cyanosis. Nails show no clubbing.  Psychiatric: He has a normal mood and affect. His speech is normal and behavior is normal. Judgment and thought content normal.     EKG:  Was ordered and interpreted by me today. Shows sinus bradycardia, 57 bpm, left axis deviation, poor R wave progression, nonspecific st/t changes   Recent Labs: 05/10/2017: ALT 19; BUN 15; Creatinine, Ser 1.24; Hemoglobin 13.2; Platelets 178; Potassium 4.7; Sodium 142; TSH 2.290  05/10/2017: Cholesterol, Total 117; HDL 46; LDL Calculated 54; Triglycerides 86   CrCl cannot be calculated (Patient's most recent lab result is older than the maximum 21 days allowed.).   Wt Readings from Last 3 Encounters:  09/28/17 190 lb 8 oz (86.4 kg)  07/26/17 189 lb 1.6 oz (  85.8 kg)  05/10/17 184 lb 4 oz (83.6 kg)     Other studies reviewed: Additional studies/records reviewed today include: summarized above  ASSESSMENT AND PLAN:  1. CAD of native coronary artery with stable angina: Check stat troponin with call report this afternoon. If troponin has not resulted by the end of the business day, call report will be signed out to [employer], PA-C with our group. If troponin is elevated he will be advised to proceed to the ED for further evaluation, admission, and LHC the following day. We will go ahead and get him scheduled for treadmill Myoview this week (pending negative troponin). He prefers noninvasive evaluation initially. Continue ASA 81 mg daily. Aggressive risk factor modification.   2. Fatigue: Of uncertain etiology at this time. Possibly related to his difficulty staying asleep and falling back asleep.  Though, I cannot rule out ischemia at this time. Ischemic evaluation as above. Change metoprolol to Coreg as there may be some component of beta blocker in his fatigue with a heart rate in the 50s bpm. Plan to check routine labs including cbc, bmet, tsh. If EF is reduced on Myoview, schedule echo to confirm.   3. HTN: BP has been running higher than it typically does lately. Change metoprolol to Coreg 12.5 mg bid. Titrate Coreg as indicated based on home BP/HR readings over the next several days. If BP continues to run > 120 mmHg would add lisinopril to his regimen.   4. Flushing: Denies any symptoms of palpitations. Ischemic evaluation as above. Labs as above. If symptoms persist with normal work up above, consider outpatient cardiac monitoring.  Disposition: F/u with myself or Dr. Mariah Milling in 2 weeks.   Current medicines are reviewed at length with the patient today.  The patient did not have any concerns regarding medicines.  Signed, Eula Listen, PA-C 09/28/2017 3:24 PM     [facility] [place] [address] [place], [place] [address] [phone]

## 2017-09-28 ENCOUNTER — Telehealth: Payer: Self-pay | Admitting: Physician Assistant

## 2017-09-28 ENCOUNTER — Other Ambulatory Visit
Admission: RE | Admit: 2017-09-28 | Discharge: 2017-09-28 | Disposition: A | Discharge status: Home / Self Care | Payer: Managed Care, Other (non HMO) | Source: Ambulatory Visit | Attending: Physician Assistant | Admitting: Physician Assistant

## 2017-09-28 ENCOUNTER — Ambulatory Visit: Payer: Managed Care, Other (non HMO) | Admitting: Physician Assistant

## 2017-09-28 ENCOUNTER — Encounter: Payer: Self-pay | Admitting: *Deleted

## 2017-09-28 ENCOUNTER — Encounter: Payer: Self-pay | Admitting: Physician Assistant

## 2017-09-28 VITALS — BP 150/90 | HR 57 | Ht 69.0 in | Wt 190.5 lb

## 2017-09-28 DIAGNOSIS — I25118 Atherosclerotic heart disease of native coronary artery with other forms of angina pectoris: Secondary | ICD-10-CM

## 2017-09-28 DIAGNOSIS — R5383 Other fatigue: Secondary | ICD-10-CM

## 2017-09-28 DIAGNOSIS — I10 Essential (primary) hypertension: Secondary | ICD-10-CM | POA: Diagnosis present

## 2017-09-28 DIAGNOSIS — R232 Flushing: Secondary | ICD-10-CM

## 2017-09-28 LAB — CBC WITH DIFFERENTIAL/PLATELET
BASOS ABS: 0 10*3/uL (ref 0–0.1)
BASOS PCT: 0 %
EOS ABS: 0.2 10*3/uL (ref 0–0.7)
EOS PCT: 3 %
HCT: 41.2 % (ref 40.0–52.0)
Hemoglobin: 14.2 g/dL (ref 13.0–18.0)
LYMPHS ABS: 1.7 10*3/uL (ref 1.0–3.6)
Lymphocytes Relative: 26 %
MCH: 32.1 pg (ref 26.0–34.0)
MCHC: 34.5 g/dL (ref 32.0–36.0)
MCV: 93.3 fL (ref 80.0–100.0)
Monocytes Absolute: 0.6 10*3/uL (ref 0.2–1.0)
Monocytes Relative: 9 %
Neutro Abs: 4.2 10*3/uL (ref 1.4–6.5)
Neutrophils Relative %: 62 %
PLATELETS: 200 10*3/uL (ref 150–440)
RBC: 4.42 MIL/uL (ref 4.40–5.90)
RDW: 13 % (ref 11.5–14.5)
WBC: 6.7 10*3/uL (ref 3.8–10.6)

## 2017-09-28 LAB — BASIC METABOLIC PANEL
Anion gap: 8 (ref 5–15)
BUN: 19 mg/dL (ref 6–20)
CO2: 26 mmol/L (ref 22–32)
Calcium: 9.3 mg/dL (ref 8.9–10.3)
Chloride: 107 mmol/L (ref 101–111)
Creatinine, Ser: 1.29 mg/dL — ABNORMAL HIGH (ref 0.61–1.24)
GFR calc Af Amer: >60 mL/min (ref >60)
Glucose, Bld: 103 mg/dL — ABNORMAL HIGH (ref 65–99)
POTASSIUM: 4.3 mmol/L (ref 3.5–5.1)
SODIUM: 141 mmol/L (ref 135–145)

## 2017-09-28 LAB — TROPONIN I

## 2017-09-28 MED ORDER — CARVEDILOL 12.5 MG PO TABS: 12.5000 mg | ORAL_TABLET | Freq: Two times a day (BID) | ORAL | 3 refills | Status: DC

## 2017-09-28 MED ORDER — CARVEDILOL 12.5 MG PO TABS: 12.5000 mg | ORAL_TABLET | Freq: Two times a day (BID) | ORAL | Status: DC

## 2017-09-28 NOTE — Patient Instructions (Addendum)
Medication Instructions: - Your physician has recommended you make the following change in your medication:  1) STOP lopressor (metoprolol) 2) START coreg (carvedilol) 12.5 mg- take 1 tablet by mouth TWICE daily   Labwork: - Your physician recommends that you have lab work today: Troponin/ BMP/ CBC (please report to the [facility] entrance- 1st desk on the right to check in)   Procedures/Testing: - Your physician has requested that you have an exercise stress myoview. For further information please visit [email or web address]. Please follow instruction sheet, as given.  [facility] MYOVIEW  Your caregiver has ordered a Stress Test with nuclear imaging. The purpose of this test is to evaluate the blood supply to your heart muscle. This procedure is referred to as a "Non-Invasive Stress Test." This is because other than having an IV started in your vein, nothing is inserted or "invades" your body. Cardiac stress tests are done to find areas of poor blood flow to the heart by determining the extent of coronary artery disease (CAD). Some patients exercise on a treadmill, which naturally increases the blood flow to your heart, while others who are  unable to walk on a treadmill due to physical limitations have a pharmacologic/chemical stress agent called Lexiscan . This medicine will mimic walking on a treadmill by temporarily increasing your coronary blood flow.   Please note: these test may take anywhere between 2-4 hours to complete  PLEASE REPORT TO [facility]  THE VOLUNTEERS AT THE FIRST DESK WILL DIRECT YOU WHERE TO GO  Date of Procedure:______Friday 1/18/19_______________________________  Arrival Time for Procedure:_______8:45 am_______________________  Instructions regarding medication:   __x__:  Hold betablocker(s) night before procedure and morning of procedure (carvedilol)  PLEASE NOTIFY THE OFFICE AT LEAST 24 HOURS IN ADVANCE IF YOU ARE UNABLE TO KEEP YOUR APPOINTMENT.   [phone] AND  PLEASE NOTIFY NUCLEAR MEDICINE AT [facility] AT LEAST 24 HOURS IN ADVANCE IF YOU ARE UNABLE TO KEEP YOUR APPOINTMENT. [phone]  How to prepare for your Myoview test:  1. Do not eat or drink after midnight 2. No caffeine for 24 hours prior to test 3. No smoking 24 hours prior to test. 4. Your medication may be taken with water.  If your doctor stopped a medication because of this test, do not take that medication. 5. Ladies, please do not wear dresses.  Skirts or pants are appropriate. Please wear a short sleeve shirt. 6. No perfume, cologne or lotion. 7. Wear comfortable walking shoes. No heels!   Follow-Up: - Your physician recommends that you schedule a follow-up appointment in: 1-2 weeks with Dr. Myer PeerGollan/ Ryan Dunn, PA   Any Additional Special Instructions Will Be Listed Below (If Applicable).     If you need a refill on your cardiac medications before your next appointment, please call your pharmacy.

## 2017-09-28 NOTE — Addendum Note (Signed)
Addended by: Sherri RadMCGHEE, HEATHER C on: 09/28/2017 03:57 PM   Modules accepted: Orders

## 2017-09-28 NOTE — Telephone Encounter (Signed)
I was asked by Eula Listenyan Dunn PA-C to follow up on stat troponin that was drawn late this afternoon, not yet resulted by the time he had left the office. His troponin was normal and labs are otherwise nonacute. Pt made aware of findings and per d/w Alycia Rossettiyan, plan nuclear stress on Friday as planned; ER precautions reviewed. He is doing well. Pt was grateful for call.  Dayna Dunn PA-C

## 2017-09-29 ENCOUNTER — Telehealth: Payer: Self-pay | Admitting: Cardiovascular Disease

## 2017-09-29 NOTE — Telephone Encounter (Signed)
Pt would like to cancel his stress test tomorrow, and reschedule for 9:00 am next Friday. He states if he does not answer his phone, it is ok to leave a detailed message.

## 2017-09-29 NOTE — Telephone Encounter (Signed)
Notified pt that stress test has been moved to 1/25, 9am per his request. He understands follow up appt will need to be changed from Jan 23 to a date after stress test. Will route to Support Pool to reschedule appt with APP or Dr. Mariah MillingGollan.

## 2017-10-05 ENCOUNTER — Ambulatory Visit: Payer: Managed Care, Other (non HMO) | Admitting: Nurse Practitioner

## 2017-10-07 ENCOUNTER — Encounter
Admission: RE | Admit: 2017-10-07 | Discharge: 2017-10-07 | Disposition: A | Discharge status: Home / Self Care | Payer: Managed Care, Other (non HMO) | Source: Ambulatory Visit | Attending: Physician Assistant | Admitting: Physician Assistant

## 2017-10-07 DIAGNOSIS — I25118 Atherosclerotic heart disease of native coronary artery with other forms of angina pectoris: Secondary | ICD-10-CM | POA: Diagnosis not present

## 2017-10-07 LAB — NM MYOCAR MULTI W/SPECT W/WALL MOTION / EF
CHL CUP MPHR: 164 {beats}/min
CHL CUP NUCLEAR SSS: 0
CHL CUP RESTING HR STRESS: 59 {beats}/min
CSEPED: 11 min
Estimated workload: 13.7 METS
Exercise duration (sec): 10 s
LV dias vol: 86 mL (ref 62–150)
LV sys vol: 42 mL
Peak HR: 139 {beats}/min
Percent HR: 85 %
SDS: 0
SRS: 4
TID: 0.71

## 2017-10-07 MED ORDER — TECHNETIUM TC 99M TETROFOSMIN IV KIT
13.3300 | PACK | Freq: Once | INTRAVENOUS | Status: AC | PRN
  Administered 2017-10-07: 13.33 via INTRAVENOUS

## 2017-10-07 MED ORDER — TECHNETIUM TC 99M TETROFOSMIN IV KIT
30.0000 | PACK | Freq: Once | INTRAVENOUS | Status: AC | PRN
  Administered 2017-10-07: 28.467 via INTRAVENOUS

## 2017-10-10 ENCOUNTER — Other Ambulatory Visit: Payer: Self-pay

## 2017-10-10 MED ORDER — ROSUVASTATIN CALCIUM 5 MG PO TABS: 5.0000 mg | ORAL_TABLET | Freq: Every day | ORAL | 2 refills | Status: DC

## 2017-10-10 MED ORDER — CARVEDILOL 25 MG PO TABS: 25.0000 mg | ORAL_TABLET | Freq: Two times a day (BID) | ORAL | 3 refills | Status: DC

## 2017-10-26 ENCOUNTER — Ambulatory Visit: Payer: Managed Care, Other (non HMO) | Admitting: Cardiovascular Disease

## 2017-10-28 ENCOUNTER — Other Ambulatory Visit: Payer: Self-pay | Admitting: Family Medicine

## 2017-10-31 NOTE — Telephone Encounter (Signed)
Flexeril refill Last OV: 07/26/17 Last Refill:Unknown Pharmacy:[facility]

## 2017-11-23 ENCOUNTER — Telehealth: Payer: Self-pay | Admitting: Cardiovascular Disease

## 2017-11-23 NOTE — Telephone Encounter (Signed)
Pt c/o medication issue:  1. Name of Medication: BP medication (he was at work and didn't know the name)   2. How are you currently taking this medication (dosage and times per day)? Once a day  3. Are you having a reaction (difficulty breathing--STAT)? No   4. What is your medication issue? He states the BP is not coming down . In the beginning he states it was working but now it is not bringing it lower  He states BP isn't terribly high but still up there   Would like some advise  Please call back

## 2017-11-23 NOTE — Telephone Encounter (Signed)
I left a message on the patient's identified voice mail to please call and verify if he is currently taking coreg 25 mg BID & also to let us know what his BP's are currently running at home.

## 2017-11-29 ENCOUNTER — Encounter: Payer: Self-pay | Admitting: *Deleted

## 2017-11-29 NOTE — Telephone Encounter (Signed)
Pt returning our call  ° °

## 2017-11-29 NOTE — Telephone Encounter (Signed)
Spoke with patient and he reports that some of his BP readings have been 140 over 70's and wanted to let us know because the increase in medication has not helped much. He does not currently have any specific blood pressure readings. Instructed him to start monitoring his daily blood pressures and keep a log of those findings and give me a call or send mychart message with those readings after a week. Reviewed that we do not like them to be 140/80 or higher consistently and that once we get those readings we can see what we may need to do. He verbalized understanding of our conversation, agreement with plan, and had no further questions at this time.

## 2018-05-06 ENCOUNTER — Other Ambulatory Visit: Payer: Self-pay

## 2018-05-08 MED ORDER — CARVEDILOL 25 MG PO TABS: 25.0000 mg | ORAL_TABLET | Freq: Two times a day (BID) | ORAL | 3 refills | Status: DC

## 2018-05-09 MED ORDER — CARVEDILOL 25 MG PO TABS: 25.0000 mg | ORAL_TABLET | Freq: Two times a day (BID) | ORAL | 0 refills | Status: DC

## 2018-05-09 NOTE — Telephone Encounter (Signed)
One 30 day refill sent in with instructions to schedule follow up appointment.

## 2018-05-09 NOTE — Addendum Note (Signed)
Addended by: Bryna ColanderALLEN, PAMELA S on: 05/09/2018 04:57 PM   Modules accepted: Orders

## 2018-05-09 NOTE — Telephone Encounter (Signed)
Please advise if ok to refill. Pt is OD for f/u.

## 2018-05-09 NOTE — Telephone Encounter (Signed)
°*  STAT* If patient is at the pharmacy, call can be transferred to refill team.   1. Which medications need to be refilled? (please list name of each medication and dose if known) carvedilol (COREG) 25 MG - 1 tablet 2 times daily  2. Which pharmacy/location (including street and city if local pharmacy) is medication to be sent to? [facility] on [address]  3. Do they need a 30 day or 90 day supply? 90 day

## 2018-06-02 ENCOUNTER — Ambulatory Visit: Payer: Managed Care, Other (non HMO) | Admitting: Unknown Physician Specialty

## 2018-06-02 ENCOUNTER — Encounter: Payer: Self-pay | Admitting: Unknown Physician Specialty

## 2018-06-02 VITALS — BP 158/91 | HR 58 | Temp 98.7°F | Ht 69.0 in | Wt 191.6 lb

## 2018-06-02 DIAGNOSIS — M7542 Impingement syndrome of left shoulder: Secondary | ICD-10-CM

## 2018-06-02 MED ORDER — MELOXICAM 15 MG PO TABS: 15.0000 mg | ORAL_TABLET | Freq: Every day | ORAL | 0 refills | Status: DC

## 2018-06-02 NOTE — Progress Notes (Signed)
BP (!) 158/91   Pulse (!) 58   Temp 98.7 F (37.1 C) (Oral)   Ht 5\' 9"  (1.753 m)   Wt 191 lb 9.6 oz (86.9 kg)   SpO2 98%   BMI 28.29 kg/m    Subjective:    Patient ID: Jeff Fields, male    DOB: 04/10/1961, 57 y.o.   MRN: [id number]  HPI: JOHNNATHAN HAGEMEISTER is a 57 y.o. male  Chief Complaint  Patient presents with  . Shoulder Pain    bilateral, states left hurts worse than right. Has been taking meloxicam and had some relief   Pt states about 2 1/2 to 3 weeks ago developed left sided neck pain.  This has since radiated down to left shoulder area.  States he started taking Mobic daily.  Noted "popping and grinding" Relevant past medical, surgical, family and social history reviewed and updated as indicated. Interim medical history since our last visit reviewed. Allergies and medications reviewed and updated.  Review of Systems  Per HPI unless specifically indicated above     Objective:    BP (!) 158/91   Pulse (!) 58   Temp 98.7 F (37.1 C) (Oral)   Ht 5\' 9"  (1.753 m)   Wt 191 lb 9.6 oz (86.9 kg)   SpO2 98%   BMI 28.29 kg/m   Wt Readings from Last 3 Encounters:  06/02/18 191 lb 9.6 oz (86.9 kg)  09/28/17 190 lb 8 oz (86.4 kg)  07/26/17 189 lb 1.6 oz (85.8 kg)    Physical Exam  Constitutional: He is oriented to person, place, and time. He appears well-developed and well-nourished. No distress.  HENT:  Head: Normocephalic and atraumatic.  Eyes: Conjunctivae and lids are normal. Right eye exhibits no discharge. Left eye exhibits no discharge. No scleral icterus.  Neck: Normal range of motion. Neck supple. No JVD present. Carotid bruit is not present.  Cardiovascular: Normal rate, regular rhythm and normal heart sounds.  Pulmonary/Chest: Effort normal and breath sounds normal. No respiratory distress.  Abdominal: Normal appearance. There is no splenomegaly or hepatomegaly.  Musculoskeletal: Normal range of motion.       Left shoulder: He exhibits normal range of motion,  no tenderness, no bony tenderness, no swelling, no effusion, no crepitus, no laceration, no pain, no spasm, normal pulse and normal strength.  Neurological: He is alert and oriented to person, place, and time.  Skin: Skin is warm, dry and intact. No rash noted. No pallor.  Psychiatric: He has a normal mood and affect. His behavior is normal. Judgment and thought content normal.    Results for orders placed or performed during the hospital encounter of 10/07/17  NM Myocar Multi W/Spect W/Wall Motion / EF  Result Value Ref Range   Rest HR 59 bpm   Rest BP 135/86 mmHg   Exercise duration (sec) 10 sec   Percent HR 85 %   Exercise duration (min) 11 min   Estimated workload 13.7 METS   Peak HR 139 bpm   Peak BP 218/171 mmHg   MPHR 164 bpm   SSS 0    SRS 4    SDS 0    TID 0.71    LV sys vol 42 mL   LV dias vol 86 62 - 150 mL      Assessment & Plan:   Problem List Items Addressed This Visit    None    Visit Diagnoses    Impingement syndrome of left shoulder    -  Primary   Discussed left shoulder exercises and icing prn.  Continue Mobic prn.  RTC if no improvement and worsening       Follow up plan: Return if symptoms worsen or fail to improve.

## 2018-06-02 NOTE — Patient Instructions (Signed)
Shoulder Impingement Syndrome Shoulder impingement syndrome is a condition that causes pain when connective tissues (tendons) surrounding the shoulder joint become pinched. These tendons are part of the group of muscles and tissues that help to stabilize the shoulder (rotator cuff). Beneath the rotator cuff is a fluid-filled sac (bursa) that allows the muscles and tendons to glide smoothly. The bursa may become swollen or irritated (bursitis). Bursitis, swelling in the rotator cuff tendons, or both conditions can decrease how much space is under a bone in the shoulder joint (acromion), resulting in impingement. What are the causes? Shoulder impingement syndrome can be caused by bursitis or swelling of the rotator cuff tendons, which may result from:  Repetitive overhead arm movements.  Falling onto the shoulder.  Weakness in the shoulder muscles.  What increases the risk? You may be more likely to develop this condition if you are an athlete who participates in:  Sports that involve throwing, such as baseball.  Tennis.  Swimming.  Volleyball.  Some people are also more likely to develop impingement syndrome because of the shape of their acromion bone. What are the signs or symptoms? The main symptom of this condition is pain on the front or side of the shoulder. Pain may:  Get worse when lifting or raising the arm.  Get worse at night.  Wake you up from sleeping.  Feel sharp when the shoulder is moved, and then fade to an ache.  Other signs and symptoms may include:  Tenderness.  Stiffness.  Inability to raise the arm above shoulder level or behind the body.  Weakness.  How is this diagnosed? This condition may be diagnosed based on:  Your symptoms.  Your medical history.  A physical exam.  Imaging tests, such as: ? X-rays. ? MRI. ? Ultrasound.  How is this treated? Treatment for this condition may include:  Resting your shoulder and avoiding all  activities that cause pain or put stress on the shoulder.  Icing your shoulder.  NSAIDs to help reduce pain and swelling.  One or more injections of medicines to numb the area and reduce inflammation.  Physical therapy.  Surgery. This may be needed if nonsurgical treatments have not helped. Surgery may involve repairing the rotator cuff, reshaping the acromion, or removing the bursa.  Follow these instructions at home: Managing pain, stiffness, and swelling  If directed, apply ice to the injured area. ? Put ice in a plastic bag. ? Place a towel between your skin and the bag. ? Leave the ice on for 20 minutes, 2-3 times a day. Activity  Rest and return to your normal activities as told by your health care provider. Ask your health care provider what activities are safe for you.  Do exercises as told by your health care provider. General instructions  Do not use any tobacco products, including cigarettes, chewing tobacco, or e-cigarettes. Tobacco can delay healing. If you need help quitting, ask your health care provider.  Ask your health care provider when it is safe for you to drive.  Take over-the-counter and prescription medicines only as told by your health care provider.  Keep all follow-up visits as told by your health care provider. This is important. How is this prevented?  Give your body time to rest between periods of activity.  Be safe and responsible while being active to avoid falls.  Maintain physical fitness, including strength and flexibility. Contact a health care provider if:  Your symptoms have not improved after 1-2 months of treatment and   rest.  You cannot lift your arm away from your body. This information is not intended to replace advice given to you by your health care provider. Make sure you discuss any questions you have with your health care provider. Document Released: 08/30/2005 Document Revised: 05/06/2016 Document Reviewed:  08/02/2015 Elsevier Interactive Patient Education  2018 [employer]  Shoulder Impingement Syndrome Rehab Ask your health care provider which exercises are safe for you. Do exercises exactly as told by your health care provider and adjust them as directed. It is normal to feel mild stretching, pulling, tightness, or discomfort as you do these exercises, but you should stop right away if you feel sudden pain or your pain gets worse.Do not begin these exercises until told by your health care provider. Stretching and range of motion exercise This exercise warms up your muscles and joints and improves the movement and flexibility of your shoulder. This exercise also helps to relieve pain and stiffness. Exercise A: Passive horizontal adduction  1. Sit or stand and pull your left / right elbow across your chest, toward your other shoulder. Stop when you feel a gentle stretch in the back of your shoulder and upper arm. ? Keep your arm at shoulder height. ? Keep your arm as close to your body as you comfortably can. 2. Hold for __________ seconds. 3. Slowly return to the starting position. Repeat __________ times. Complete this exercise __________ times a day. Strengthening exercises These exercises build strength and endurance in your shoulder. Endurance is the ability to use your muscles for a long time, even after they get tired. Exercise B: External rotation, isometric 1. Stand or sit in a doorway, facing the door frame. 2. Bend your left / right elbow and place the back of your wrist against the door frame. Only your wrist should be touching the frame. Keep your upper arm at your side. 3. Gently press your wrist against the door frame, as if you are trying to push your arm away from your abdomen. ? Avoid shrugging your shoulder while you press your hand against the door frame. Keep your shoulder blade tucked down toward the middle of your back. 4. Hold for __________ seconds. 5. Slowly release  the tension, and relax your muscles completely before you do the exercise again. Repeat __________ times. Complete this exercise __________ times a day. Exercise C: Internal rotation, isometric  1. Stand or sit in a doorway, facing the door frame. 2. Bend your left / right elbow and place the inside of your wrist against the door frame. Only your wrist should be touching the frame. Keep your upper arm at your side. 3. Gently press your wrist against the door frame, as if you are trying to push your arm toward your abdomen. ? Avoid shrugging your shoulder while you press your hand against the door frame. Keep your shoulder blade tucked down toward the middle of your back. 4. Hold for __________ seconds. 5. Slowly release the tension, and relax your muscles completely before you do the exercise again. Repeat __________ times. Complete this exercise __________ times a day. Exercise D: Scapular protraction, supine  1. Lie on your back on a firm surface. Hold a __________ weight in your left / right hand. 2. Raise your left / right arm straight into the air so your hand is directly above your shoulder joint. 3. Push the weight into the air so your shoulder lifts off of the surface that you are lying on. Do not move your   head, neck, or back. 4. Hold for __________ seconds. 5. Slowly return to the starting position. Let your muscles relax completely before you repeat this exercise. Repeat __________ times. Complete this exercise __________ times a day. Exercise E: Scapular retraction  1. Sit in a stable chair without armrests, or stand. 2. Secure an exercise band to a stable object in front of you so the band is at shoulder height. 3. Hold one end of the exercise band in each hand. Your palms should face down. 4. Squeeze your shoulder blades together and move your elbows slightly behind you. Do not shrug your shoulders while you do this. 5. Hold for __________ seconds. 6. Slowly return to the  starting position. Repeat __________ times. Complete this exercise __________ times a day. Exercise F: Shoulder extension  1. Sit in a stable chair without armrests, or stand. 2. Secure an exercise band to a stable object in front of you where the band is above shoulder height. 3. Hold one end of the exercise band in each hand. 4. Straighten your elbows and lift your hands up to shoulder height. 5. Squeeze your shoulder blades together and pull your hands down to the sides of your thighs. Stop when your hands are straight down by your sides. Do not let your hands go behind your body. 6. Hold for __________ seconds. 7. Slowly return to the starting position. Repeat __________ times. Complete this exercise __________ times a day. This information is not intended to replace advice given to you by your health care provider. Make sure you discuss any questions you have with your health care provider. Document Released: 08/30/2005 Document Revised: 05/06/2016 Document Reviewed: 08/02/2015 Elsevier Interactive Patient Education  2018 Elsevier Inc.  

## 2018-11-26 ENCOUNTER — Telehealth: Payer: Self-pay | Admitting: Physician Assistant

## 2018-12-20 ENCOUNTER — Other Ambulatory Visit: Payer: Self-pay | Admitting: *Deleted

## 2018-12-20 ENCOUNTER — Telehealth: Payer: Self-pay | Admitting: Cardiovascular Disease

## 2018-12-20 MED ORDER — ROSUVASTATIN CALCIUM 5 MG PO TABS: 5.0000 mg | ORAL_TABLET | Freq: Every day | ORAL | 0 refills | Status: DC

## 2018-12-20 NOTE — Telephone Encounter (Signed)
I don't think FMLA would work in his situation. Typically used to get excused from work for a doctor visit, etc, not for periods of time, like disability

## 2018-12-20 NOTE — Telephone Encounter (Signed)
°*  STAT* If patient is at the pharmacy, call can be transferred to refill team.   1. Which medications need to be refilled? (please list name of each medication and dose if known) rosuvastatin 5 MG   2. Which pharmacy/location (including street and city if local pharmacy) is medication to be sent to? [facility]   3. Do they need a 30 day or 90 day supply? 90 day   Patient scheduled virtual visit for 4/14

## 2018-12-20 NOTE — Telephone Encounter (Signed)
Virtual Visit Pre-Appointment Phone Call  Steps For Call:  1. Confirm consent - "In the setting of the current Covid19 crisis, you are scheduled for a (phone or video) visit with your provider on (date) at (time).  Just as we do with many in-office visits, in order for you to participate in this visit, we must obtain consent.  If you'd like, I can send this to your mychart (if signed up) or email for you to review.  Otherwise, I can obtain your verbal consent now.  All virtual visits are billed to your insurance company just like a normal visit would be.  By agreeing to a virtual visit, we'd like you to understand that the technology does not allow for your provider to perform an examination, and thus may limit your provider's ability to fully assess your condition.  Finally, though the technology is pretty good, we cannot assure that it will always work on either your or our end, and in the setting of a video visit, we may have to convert it to a phone-only visit.  In either situation, we cannot ensure that we have a secure connection.  Are you willing to proceed?"  2. Give patient instructions for WebEx download to smartphone as below if video visit  3. Advise patient to be prepared with any vital sign or heart rhythm information, their current medicines, and a piece of paper and pen handy for any instructions they may receive the day of their visit  4. Inform patient they will receive a phone call 15 minutes prior to their appointment time (may be from unknown caller ID) so they should be prepared to answer  5. Confirm that appointment type is correct in Epic appointment notes (video vs telephone)    TELEPHONE CALL NOTE  Jeff Fields has been deemed a candidate for a follow-up tele-health visit to limit community exposure during the Covid-19 pandemic. I spoke with the patient via phone to ensure availability of phone/video source, confirm preferred email & phone number, and discuss  instructions and expectations.  I reminded Jeff Fields to be prepared with any vital sign and/or heart rhythm information that could potentially be obtained via home monitoring, at the time of his visit. I reminded Jeff Fields to expect a phone call at the time of his visit if his visit.  Did the patient verbally acknowledge consent to treatment? Yes   Norman Herrlich 12/20/2018 2:02 PM   DOWNLOADING THE WEBEX SOFTWARE TO SMARTPHONE  - If Apple, go to [employer] and type in WebEx in the search bar. Download [employer], the blue/green circle. The app is free but as with any other app downloads, their phone may require them to verify saved payment information or Apple password. The patient does NOT have to create an account.  - If Android, ask patient to go to [employer] and type in WebEx in the search bar. Download [employer], the blue/green circle. The app is free but as with any other app downloads, their phone may require them to verify saved payment information or Android password. The patient does NOT have to create an account.   CONSENT FOR TELE-HEALTH VISIT - PLEASE REVIEW  I hereby voluntarily request, consent and authorize [facility] and its employed or contracted physicians, physician assistants, nurse practitioners or other licensed health care professionals (the Practitioner), to provide me with telemedicine health care services (the Services") as deemed necessary by the treating Practitioner. I  acknowledge and consent to receive the Services by the Practitioner via telemedicine. I understand that the telemedicine visit will involve communicating with the Practitioner through live audiovisual communication technology and the disclosure of certain medical information by electronic transmission. I acknowledge that I have been given the opportunity to request an in-person assessment or other available alternative prior to the telemedicine visit and am  voluntarily participating in the telemedicine visit.  I understand that I have the right to withhold or withdraw my consent to the use of telemedicine in the course of my care at any time, without affecting my right to future care or treatment, and that the Practitioner or I may terminate the telemedicine visit at any time. I understand that I have the right to inspect all information obtained and/or recorded in the course of the telemedicine visit and may receive copies of available information for a reasonable fee.  I understand that some of the potential risks of receiving the Services via telemedicine include:   Delay or interruption in medical evaluation due to technological equipment failure or disruption;  Information transmitted may not be sufficient (e.g. poor resolution of images) to allow for appropriate medical decision making by the Practitioner; and/or   In rare instances, security protocols could fail, causing a breach of personal health information.  Furthermore, I acknowledge that it is my responsibility to provide information about my medical history, conditions and care that is complete and accurate to the best of my ability. I acknowledge that Practitioner's advice, recommendations, and/or decision may be based on factors not within their control, such as incomplete or inaccurate data provided by me or distortions of diagnostic images or specimens that may result from electronic transmissions. I understand that the practice of medicine is not an exact science and that Practitioner makes no warranties or guarantees regarding treatment outcomes. I acknowledge that I will receive a copy of this consent concurrently upon execution via email to the email address I last provided but may also request a printed copy by calling the office of [facility].    I understand that my insurance will be billed for this visit.   I have read or had this consent read to me.  I understand the  contents of this consent, which adequately explains the benefits and risks of the Services being provided via telemedicine.   I have been provided ample opportunity to ask questions regarding this consent and the Services and have had my questions answered to my satisfaction.  I give my informed consent for the services to be provided through the use of telemedicine in my medical care  By participating in this telemedicine visit I agree to the above.

## 2018-12-20 NOTE — Telephone Encounter (Signed)
Requested Prescriptions   Signed Prescriptions Disp Refills  . rosuvastatin (CRESTOR) 5 MG tablet 90 tablet 0    Sig: Take 1 tablet (5 mg total) by mouth daily.    Authorizing Provider: DUNN, RYAN M    Ordering User: LOPEZ, MARINA C   

## 2018-12-20 NOTE — Telephone Encounter (Signed)
Patient returning call.

## 2018-12-20 NOTE — Telephone Encounter (Signed)
Call placed to the patient. The line kept getting disconnected. Will try back later.

## 2018-12-20 NOTE — Telephone Encounter (Signed)
Patient calling Patient has questions in regards to his heart condition and trying to file for FMLA Please call to discuss

## 2018-12-20 NOTE — Telephone Encounter (Signed)
Requested Prescriptions   Signed Prescriptions Disp Refills  . rosuvastatin (CRESTOR) 5 MG tablet 90 tablet 0    Sig: Take 1 tablet (5 mg total) by mouth daily.    Authorizing Provider: Sondra Barges    Ordering User: Kendrick Fries

## 2018-12-20 NOTE — Telephone Encounter (Signed)
Returned the call to the patient. He stated that he was told at his job that they could either use PAL or FMLA to be off of work. He was calling to see if he could get the office to sign FMLA papers. He has been advised that this may not be possible unless there are cardiac problems that prevent him from working.   He was last seen in January of 2019. He now has a follow up on 12/26/2018.

## 2018-12-21 ENCOUNTER — Telehealth: Payer: Self-pay | Admitting: Cardiovascular Disease

## 2018-12-21 NOTE — Telephone Encounter (Signed)
I spoke with the patient regarding Dr. Windell Hummingbird comments about FMLA for him.  The patient voiced and understanding that we were unable to do this for him at this time. The patient was appreciative for the call back.

## 2018-12-21 NOTE — Telephone Encounter (Signed)
Patient returning call.

## 2018-12-25 ENCOUNTER — Telehealth: Payer: Self-pay

## 2018-12-25 NOTE — Telephone Encounter (Signed)
Spoke with patient.  Went over how his video visit would work.  At this time patient denied a telehealth visit and wanted to reschedule in June.  Made him aware that we are unsure if we will be seeing patiens in the office at that time.  Scheduled him for June 11th and made him aware that we would call him if we need to change back to a telehealth visit.

## 2018-12-26 ENCOUNTER — Telehealth: Payer: Managed Care, Other (non HMO) | Admitting: Cardiovascular Disease

## 2019-01-01 ENCOUNTER — Encounter: Payer: Self-pay | Admitting: Family Medicine

## 2019-01-01 ENCOUNTER — Other Ambulatory Visit: Payer: Self-pay

## 2019-01-01 ENCOUNTER — Ambulatory Visit (INDEPENDENT_AMBULATORY_CARE_PROVIDER_SITE_OTHER): Payer: 59 | Admitting: Family Medicine

## 2019-01-01 DIAGNOSIS — M545 Low back pain, unspecified: Secondary | ICD-10-CM

## 2019-01-01 DIAGNOSIS — I10 Essential (primary) hypertension: Secondary | ICD-10-CM | POA: Diagnosis not present

## 2019-01-01 DIAGNOSIS — E785 Hyperlipidemia, unspecified: Secondary | ICD-10-CM

## 2019-01-01 MED ORDER — CYCLOBENZAPRINE HCL 10 MG PO TABS: 10.0000 mg | ORAL_TABLET | Freq: Every day | ORAL | 1 refills | Status: DC

## 2019-01-01 MED ORDER — MELOXICAM 15 MG PO TABS: 15.0000 mg | ORAL_TABLET | Freq: Every day | ORAL | 0 refills | Status: DC

## 2019-01-01 NOTE — Assessment & Plan Note (Signed)
Will follow up with cardiology. Offered blood work, declined. Call with any concerns.

## 2019-01-01 NOTE — Assessment & Plan Note (Signed)
Will follow up with cardiology. Offered blood work, declined. Call with any concerns. 

## 2019-01-01 NOTE — Assessment & Plan Note (Signed)
In acute exacerbation. Will treat with exercises and flexeril. Call if not getting better or getting worse. Recheck 1-2 weeks to confirm resolution.

## 2019-01-01 NOTE — Progress Notes (Signed)
There were no vitals taken for this visit.   Subjective:    Patient ID: Jeff Fields, male    DOB: Aug 22, 1961, 58 y.o.   MRN: [id number]  HPI: Jeff Fields is a 58 y.o. male  Chief Complaint  Patient presents with  . Back Pain    since Friday, patient states that he was doing work around the house and over did it.    BACK PAIN Duration: 3 days Mechanism of injury: working in the yard Location: Right and low back Onset: sudden Severity: moderate Quality: burning Frequency: with movement Radiation: none Aggravating factors: movement, walking and bending Alleviating factors: rest, laying and NSAIDs Status: better Treatments attempted: meloxicam and rest  Relief with NSAIDs?: significant Nighttime pain:  no Paresthesias / decreased sensation:  no Bowel / bladder incontinence:  no Fevers:  no Dysuria / urinary frequency:  no   HYPERTENSION / HYPERLIPIDEMIA- follows with Dr. Mariah Milling, due for an appointment, but has pushed it off until July Satisfied with current treatment? yes Duration of hypertension: chronic BP monitoring frequency: not checking BP medication side effects: no Past BP meds: carvedilol Duration of hyperlipidemia: chronic Cholesterol medication side effects: no Cholesterol supplements: none Past cholesterol medications: crestor Medication compliance: good compliance Aspirin: yes Recent stressors: yes Recurrent headaches: no Visual changes: no Palpitations: no Dyspnea: no Chest pain: no Lower extremity edema: no Dizzy/lightheaded: no   Relevant past medical, surgical, family and social history reviewed and updated as indicated. Interim medical history since our last visit reviewed. Allergies and medications reviewed and updated.  Review of Systems  Constitutional: Negative.   Respiratory: Negative.   Cardiovascular: Negative.   Gastrointestinal: Negative.   Musculoskeletal: Positive for back pain and myalgias. Negative for arthralgias, gait  problem, joint swelling, neck pain and neck stiffness.  Skin: Negative.   Neurological: Negative.   Psychiatric/Behavioral: Negative.     Per HPI unless specifically indicated above     Objective:    There were no vitals taken for this visit.  Wt Readings from Last 3 Encounters:  06/02/18 191 lb 9.6 oz (86.9 kg)  09/28/17 190 lb 8 oz (86.4 kg)  07/26/17 189 lb 1.6 oz (85.8 kg)    Physical Exam Vitals signs and nursing note reviewed.  Constitutional:      General: He is not in acute distress.    Appearance: Normal appearance. He is not ill-appearing, toxic-appearing or diaphoretic.  HENT:     Head: Normocephalic and atraumatic.     Right Ear: External ear normal.     Left Ear: External ear normal.     Nose: Nose normal.     Mouth/Throat:     Mouth: Mucous membranes are moist.     Pharynx: Oropharynx is clear.  Eyes:     General: No scleral icterus.       Right eye: No discharge.        Left eye: No discharge.     Conjunctiva/sclera: Conjunctivae normal.     Pupils: Pupils are equal, round, and reactive to light.  Neck:     Musculoskeletal: Normal range of motion.  Pulmonary:     Effort: Pulmonary effort is normal. No respiratory distress.     Comments: Speaking in full sentences Musculoskeletal: Normal range of motion.  Skin:    Coloration: Skin is not jaundiced or pale.     Findings: No bruising, erythema, lesion or rash.  Neurological:     Mental Status: He is alert and oriented to person,  place, and time. Mental status is at baseline.  Psychiatric:        Mood and Affect: Mood normal.        Behavior: Behavior normal.        Thought Content: Thought content normal.        Judgment: Judgment normal.     Results for orders placed or performed during the hospital encounter of 10/07/17  NM Myocar Multi W/Spect W/Wall Motion / EF  Result Value Ref Range   Rest HR 59 bpm   Rest BP 135/86 mmHg   Exercise duration (sec) 10 sec   Percent HR 85 %   Exercise  duration (min) 11 min   Estimated workload 13.7 METS   Peak HR 139 bpm   Peak BP 218/171 mmHg   MPHR 164 bpm   SSS 0    SRS 4    SDS 0    TID 0.71    LV sys vol 42 mL   LV dias vol 86 62 - 150 mL      Assessment & Plan:   Problem List Items Addressed This Visit      Cardiovascular and Mediastinum   Benign hypertension    Will follow up with cardiology. Offered blood work, declined. Call with any concerns.        Other   Hyperlipidemia    Will follow up with cardiology. Offered blood work, declined. Call with any concerns.      Low back pain - Primary    In acute exacerbation. Will treat with exercises and flexeril. Call if not getting better or getting worse. Recheck 1-2 weeks to confirm resolution.       Relevant Medications   meloxicam (MOBIC) 15 MG tablet   cyclobenzaprine (FLEXERIL) 10 MG tablet       Follow up plan: Return 1-2 weeks, for follow up back.   . This visit was completed via Skype due to the restrictions of the COVID-19 pandemic. All issues as above were discussed and addressed. Physical exam was done as above through visual confirmation on Skype. If it was felt that the patient should be evaluated in the office, they were directed there. The patient verbally consented to this visit. . Location of the patient: home . Location of the provider: home . Those involved with this call:  . Provider: Olevia PerchesMegan , DO . CMA: Tiffany Reel, CMA . Front Desk/Registration: Adela Portshristan Williamson  . Time spent on call: 25 minutes on the phone discussing health concerns. 40 minutes total spent in review of patient's record and preparation of their chart.

## 2019-01-03 ENCOUNTER — Telehealth: Payer: Self-pay | Admitting: Family Medicine

## 2019-01-03 NOTE — Telephone Encounter (Signed)
Copied from CRM [id number]. Topic: General - Other >> Jan 02, 2019  3:19 PM Tamela Oddi wrote: Reason for CRM: Patient called to request a note for his employer, which he said the doctor was going to send through My Chart.  Patient stated that he still has not seen the note and would like it before Thursday, when he goes back to work.  Please advise and call patient if there is a problem.  CB# [phone] >> Jan 03, 2019  9:02 AM Dalphine Handing A wrote: Patient called back to check on the status of his doctors note for employer. Patient stated that he needs the note for today because he returns to work tomorrow. >> Jan 03, 2019 11:50 AM Adela Ports M wrote: Please advise.

## 2019-01-03 NOTE — Telephone Encounter (Signed)
Letter written

## 2019-01-31 ENCOUNTER — Telehealth: Payer: Self-pay | Admitting: Family Medicine

## 2019-01-31 NOTE — Telephone Encounter (Signed)
Spoke with patient. He states his back issues are doing better at the present but will call us if he needs to follow up

## 2019-02-14 ENCOUNTER — Telehealth: Payer: Self-pay

## 2019-02-14 NOTE — Telephone Encounter (Signed)
Called patient.  No answer. LMOV.  Need to change to an Evisit  

## 2019-02-14 NOTE — Telephone Encounter (Signed)
Patient returned my call.  He stated he did not think that a telehealth appointment would be helpful in his care.  He is currently having NO cardiac symptoms.  He will call back with any refills he may need.

## 2019-02-22 ENCOUNTER — Ambulatory Visit: Payer: 59 | Admitting: Cardiovascular Disease

## 2019-04-02 ENCOUNTER — Telehealth: Payer: Self-pay | Admitting: Cardiovascular Disease

## 2019-04-02 NOTE — Telephone Encounter (Signed)
°*  STAT* If patient is at the pharmacy, call can be transferred to refill team.   1. Which medications need to be refilled? (please list name of each medication and dose if known)  Rosuvastation 5mg  daily, Carvediolol 25 mg 2x day  2. Which pharmacy/location (including street and city if local pharmacy) is medication to be sent to? Rosuvastation sent through [facility] on [address]  3. Do they need a 30 day or 90 day supply? 90 days

## 2019-04-03 ENCOUNTER — Other Ambulatory Visit: Payer: Self-pay | Admitting: *Deleted

## 2019-04-03 MED ORDER — ROSUVASTATIN CALCIUM 5 MG PO TABS: 5.0000 mg | ORAL_TABLET | Freq: Every day | ORAL | 0 refills | Status: DC

## 2019-04-03 MED ORDER — CARVEDILOL 25 MG PO TABS: 25.0000 mg | ORAL_TABLET | Freq: Two times a day (BID) | ORAL | 0 refills | Status: DC

## 2019-04-03 NOTE — Telephone Encounter (Signed)
Requested Prescriptions   Signed Prescriptions Disp Refills  . rosuvastatin (CRESTOR) 5 MG tablet 30 tablet 0    Sig: Take 1 tablet (5 mg total) by mouth daily.    Authorizing Provider: DUNN, RYAN M    Ordering User: Braley Luckenbaugh C  . carvedilol (COREG) 25 MG tablet 60 tablet 0    Sig: Take 1 tablet (25 mg total) by mouth 2 (two) times daily. Please schedule follow up appointment.    Authorizing Provider: DUNN, RYAN M    Ordering User: Alexsia Klindt C   

## 2019-04-03 NOTE — Telephone Encounter (Signed)
Ok to refill up to 04/2019 appt. Pt should keep his appt for additional refills

## 2019-04-03 NOTE — Telephone Encounter (Signed)
Please advise if ok to refill pt's Crestor 5 mg qd and Carverdilol 25 mg BID. Pt overdue for 2 wk fu last seen 09/2017. Pt has upcoming appointment 04/2019.

## 2019-04-03 NOTE — Telephone Encounter (Signed)
Requested Prescriptions   Signed Prescriptions Disp Refills  . rosuvastatin (CRESTOR) 5 MG tablet 30 tablet 0    Sig: Take 1 tablet (5 mg total) by mouth daily.    Authorizing Provider: Rise Mu    Ordering User: Othelia Pulling C  . carvedilol (COREG) 25 MG tablet 60 tablet 0    Sig: Take 1 tablet (25 mg total) by mouth 2 (two) times daily. Please schedule follow up appointment.    Authorizing Provider: Rise Mu    Ordering User: Britt Bottom

## 2019-04-09 NOTE — Telephone Encounter (Signed)
°*  STAT* If patient is at the pharmacy, call can be transferred to refill team.   1. Which medications need to be refilled? (please list name of each medication and dose if known) ROSUVASTATIN (CRESTOR) 5 MG 1 daily   2. Which pharmacy/location (including street and city if local pharmacy) is medication to be sent to? The rosuvastatin needed to go to [facility] - carvedilol went to [place]  3. Do they need a 30 day or 90 day supply? 90 day

## 2019-04-10 ENCOUNTER — Other Ambulatory Visit: Payer: Self-pay | Admitting: *Deleted

## 2019-04-10 MED ORDER — ROSUVASTATIN CALCIUM 5 MG PO TABS: 5.0000 mg | ORAL_TABLET | Freq: Every day | ORAL | 0 refills | Status: DC

## 2019-04-10 NOTE — Telephone Encounter (Signed)
New Rx sent to [facility] for Crestor 5 mg qd. Contacted [facility] to cancel Crestor 5 mg qd refill.

## 2019-04-10 NOTE — Telephone Encounter (Signed)
Requested Prescriptions   Signed Prescriptions Disp Refills  . rosuvastatin (CRESTOR) 5 MG tablet 90 tablet 0    Sig: Take 1 tablet (5 mg total) by mouth daily.    Authorizing Provider: GOLLAN, TIMOTHY J    Ordering User: LOPEZ, MARINA C    

## 2019-04-10 NOTE — Telephone Encounter (Signed)
Requested Prescriptions   Signed Prescriptions Disp Refills  . rosuvastatin (CRESTOR) 5 MG tablet 90 tablet 0    Sig: Take 1 tablet (5 mg total) by mouth daily.    Authorizing Provider: GOLLAN, TIMOTHY J    Ordering User: Idalie Canto C    

## 2019-04-29 NOTE — Progress Notes (Signed)
Patient ID: Jeff Fields, male   DOB: 06/08/1961, 58 y.o.   MRN: [id number] Cardiology Office Note  Date:  04/30/2019   ID:  Jeff Fields, DOB 03/20/1961, MRN [id number]  PCP:  Jeff CarrowJohnson, Megan P, DO   Chief Complaint  Patient presents with  . other    follow up. Medications reviewed verbally.     HPI:  Jeff Fields is a 58 year old gentleman with History of remote smoking history,  initially presenting with chest pain on exertion,  stress test that showed anterior wall ischemia, cardiac catheterization and stent placement to his mid LAD 11/08/14.  He presents today for follow-up of his coronary artery disease  Feels weight up No recent labs  Has labs done through work  No exercise  In follow-up today he reports having had 2 or 3 episodes of vague discomfort in his upper chest, sternal notch area. Seem to happen when he was pushing heavy items at work Has not had other episodes with exertion Feels different from previous anginal symptoms Otherwise feels well with no complaints except for insomnia  Tolerating low-dose Crestor daily 5 mg  Previous lab work reviewed LDL 59 Work labs and lipids, not available  EKG personally reviewed by myself on todays visit shows normal sinus rhythm, rate 61 bpm no significant ST or T-wave changes   other past medical history Previous stress test showed anterior wall ischemia seen in both non-attenuation corrected and attenuation corrected images. Wall motion abnormality to the anterior and anteroseptal wall  Father diagnosed with testicular cancer requiring treatment. Patient found a lump on his testicle and after significant workup, this was found to be a benign finding  PMH:   has a past medical history of Coronary artery disease, Hyperlipidemia, and Hypertension. Also history of coronary artery disease, prior stent  PSH:    Past Surgical History:  Procedure Laterality Date  . CARDIAC CATHETERIZATION  11/08/2014  . CORONARY ANGIOPLASTY WITH  STENT PLACEMENT  11/08/2014   drug eluting stent placement to the mid LAD  . VASECTOMY  2000    Current Outpatient Medications  Medication Sig Dispense Refill  . aspirin 81 MG tablet Take 81 mg by mouth daily.    . carvedilol (COREG) 25 MG tablet Take 1 tablet (25 mg total) by mouth 2 (two) times daily. Please schedule follow up appointment. 60 tablet 0  . clonazePAM (KLONOPIN) 0.5 MG tablet Take 1 tablet (0.5 mg total) by mouth at bedtime. 20 tablet 1  . Coenzyme Q10 (COQ-10) 200 MG CAPS Take 200 mg by mouth daily.    . cyclobenzaprine (FLEXERIL) 10 MG tablet Take 1 tablet (10 mg total) by mouth at bedtime. 30 tablet 1  . meloxicam (MOBIC) 15 MG tablet Take 1 tablet (15 mg total) by mouth daily. 90 tablet 0  . rosuvastatin (CRESTOR) 5 MG tablet Take 1 tablet (5 mg total) by mouth daily. 90 tablet 0   No current facility-administered medications for this visit.      Allergies:   Patient has no known allergies.   Social History:  The patient  reports that he quit smoking about 30 years ago. His smoking use included cigarettes. He smoked 1.00 pack per day. He has never used smokeless tobacco. He reports current alcohol use. He reports that he does not use drugs.   Family History:   family history includes Arrhythmia in his father; Arthritis in his brother; Hypertension in his brother, father, and mother; Lung cancer in his maternal grandfather; Stroke in  his maternal grandmother.    Review of Systems: Review of Systems  Constitutional: Negative.   HENT: Negative.   Respiratory: Negative.   Cardiovascular: Negative.   Gastrointestinal: Negative.   Musculoskeletal: Negative.   Skin: Negative.   Neurological: Negative.   Psychiatric/Behavioral: Negative.   All other systems reviewed and are negative.   PHYSICAL EXAM: VS:  BP (!) 142/90 (BP Location: Left Arm, Patient Position: Sitting, Cuff Size: Normal)   Pulse (!) 58   Ht 5\' 9"  (1.753 m)   Wt 196 lb (88.9 kg)   SpO2 99%    BMI 28.94 kg/m  , BMI Body mass index is 28.94 kg/m.  Constitutional:  oriented to person, place, and time. No distress.  HENT:  Head: Grossly normal Eyes:  no discharge. No scleral icterus.  Neck: No JVD, no carotid bruits  Cardiovascular: Regular rate and rhythm, no murmurs appreciated Pulmonary/Chest: Clear to auscultation bilaterally, no wheezes or rails Abdominal: Soft.  no distension.  no tenderness.  Musculoskeletal: Normal range of motion Neurological:  normal muscle tone. Coordination normal. No atrophy Skin: Skin warm and dry Psychiatric: normal affect, pleasant   Recent Labs: No results found for requested labs within last 8760 hours.    Lipid Panel Lab Results  Component Value Date   CHOL 117 05/10/2017   HDL 46 05/10/2017   LDLCALC 54 05/10/2017   TRIG 86 05/10/2017      Wt Readings from Last 3 Encounters:  04/30/19 196 lb (88.9 kg)  06/02/18 191 lb 9.6 oz (86.9 kg)  09/28/17 190 lb 8 oz (86.4 kg)      ASSESSMENT AND PLAN:  Coronary artery disease involving native coronary artery of native heart without angina pectoris Currently with no symptoms of angina. No further workup at this time. Continue current medication regimen.  Hyperlipidemia Cholesterol is at goal on the current lipid regimen. No changes to the medications were made.  Stable  Essential hypertension Blood pressure running a little bit high Recommended he closely monitor blood pressure at home when he is not at work  S/Fields coronary artery stent placement No further work-up at this time   Total encounter time more than 25 minutes  Greater than 50% was spent in counseling and coordination of care with the patient   Disposition:   F/U  12 months   Orders Placed This Encounter  Procedures  . EKG 12-Lead     Signed, Esmond Plants, M.D., Ph.D. 04/30/2019  [place], [place]

## 2019-04-30 ENCOUNTER — Encounter: Payer: Self-pay | Admitting: Cardiovascular Disease

## 2019-04-30 ENCOUNTER — Other Ambulatory Visit: Payer: Self-pay

## 2019-04-30 ENCOUNTER — Ambulatory Visit: Payer: 59 | Admitting: Cardiovascular Disease

## 2019-04-30 VITALS — BP 142/90 | HR 58 | Ht 69.0 in | Wt 196.0 lb

## 2019-04-30 DIAGNOSIS — I10 Essential (primary) hypertension: Secondary | ICD-10-CM

## 2019-04-30 DIAGNOSIS — I25118 Atherosclerotic heart disease of native coronary artery with other forms of angina pectoris: Secondary | ICD-10-CM

## 2019-04-30 DIAGNOSIS — Z87891 Personal history of nicotine dependence: Secondary | ICD-10-CM

## 2019-04-30 DIAGNOSIS — E782 Mixed hyperlipidemia: Secondary | ICD-10-CM

## 2019-04-30 MED ORDER — CARVEDILOL 25 MG PO TABS: 25.0000 mg | ORAL_TABLET | Freq: Two times a day (BID) | ORAL | 3 refills | Status: DC

## 2019-04-30 NOTE — Patient Instructions (Addendum)
Please monitor BP on the weekends 2 hours after taking medication Goal low 130s  Bottom number <90  Medication Instructions:  No changes  If you need a refill on your cardiac medications before your next appointment, please call your pharmacy.    Lab work: No new labs needed   If you have labs (blood work) drawn today and your tests are completely normal, you will receive your results only by: Marland Kitchen MyChart Message (if you have MyChart) OR . A paper copy in the mail If you have any lab test that is abnormal or we need to change your treatment, we will call you to review the results.   Testing/Procedures: No new testing needed   Follow-Up: At [facility], you and your health needs are our priority.  As part of our continuing mission to provide you with exceptional heart care, we have created designated Provider Care Teams.  These Care Teams include your primary Cardiologist (physician) and Advanced Practice Providers (APPs -  Physician Assistants and Nurse Practitioners) who all work together to provide you with the care you need, when you need it.  . You will need a follow up appointment in 12 months .   Please call our office 2 months in advance to schedule this appointment.    . Providers on your designated Care Team:   . Murray Hodgkins, NP . Christell Faith, PA-C . Marrianne Mood, PA-C  Any Other Special Instructions Will Be Listed Below (If Applicable).  For educational health videos Log in to : www.myemmi.com Or : [email or web address], password : [email or web address]   How to Take Your Blood Pressure You can take your blood pressure at home with a machine. You may need to check your blood pressure at home:  To check if you have high blood pressure (hypertension).  To check your blood pressure over time.  To make sure your blood pressure medicine is working. Supplies needed: You will need a blood pressure machine, or monitor. You can buy one at a drugstore or online. When choosing  one:  Choose one with an arm cuff.  Choose one that wraps around your upper arm. Only one finger should fit between your arm and the cuff.  Do not choose one that measures your blood pressure from your wrist or finger. Your doctor can suggest a monitor. How to prepare Avoid these things for 30 minutes before checking your blood pressure:  Drinking caffeine.  Drinking alcohol.  Eating.  Smoking.  Exercising. Five minutes before checking your blood pressure:  Pee.  Sit in a dining chair. Avoid sitting in a soft couch or armchair.  Be quiet. Do not talk. How to take your blood pressure Follow the instructions that came with your machine. If you have a digital blood pressure monitor, these may be the instructions: 1. Sit up straight. 2. Place your feet on the floor. Do not cross your ankles or legs. 3. Rest your left arm at the level of your heart. You may rest it on a table, desk, or chair. 4. Pull up your shirt sleeve. 5. Wrap the blood pressure cuff around the upper part of your left arm. The cuff should be 1 inch (2.5 cm) above your elbow. It is best to wrap the cuff around bare skin. 6. Fit the cuff snugly around your arm. You should be able to place only one finger between the cuff and your arm. 7. Put the cord inside the groove of your elbow. 8. Press the power button. 9.  Sit quietly while the cuff fills with air and loses air. 10. Write down the numbers on the screen. 11. Wait 2-3 minutes and then repeat steps 1-10. What do the numbers mean? Two numbers make up your blood pressure. The first number is called systolic pressure. The second is called diastolic pressure. An example of a blood pressure reading is "120 over 80" (or 120/80). If you are an adult and do not have a medical condition, use this guide to find out if your blood pressure is normal: Normal  First number: below 120.  Second number: below 80. Elevated  First number: 120-129.  Second number:  below 80. Hypertension stage 1  First number: 130-139.  Second number: 80-89. Hypertension stage 2  First number: 140 or above.  Second number: 90 or above. Your blood pressure is above normal even if only the top or bottom number is above normal. Follow these instructions at home:  Check your blood pressure as often as your doctor tells you to.  Take your monitor to your next doctor's appointment. Your doctor will: ? Make sure you are using it correctly. ? Make sure it is working right.  Make sure you understand what your blood pressure numbers should be.  Tell your doctor if your medicines are causing side effects. Contact a doctor if:  Your blood pressure keeps being high. Get help right away if:  Your first blood pressure number is higher than 180.  Your second blood pressure number is higher than 120. This information is not intended to replace advice given to you by your health care provider. Make sure you discuss any questions you have with your health care provider. Document Released: 08/12/2008 Document Revised: 08/12/2017 Document Reviewed: 02/06/2016 Elsevier Patient Education  2020 Elsevier Inc.  Blood Pressure Record Sheet To take your blood pressure, you will need a blood pressure machine. You can buy a blood pressure machine (blood pressure monitor) at your clinic, drug store, or online. When choosing one, consider:  An automatic monitor that has an arm cuff.  A cuff that wraps snugly around your upper arm. You should be able to fit only one finger between your arm and the cuff.  A device that stores blood pressure reading results.  Do not choose a monitor that measures your blood pressure from your wrist or finger. Follow your health care provider's instructions for how to take your blood pressure. To use this form:  Get one reading in the morning (a.m.) before you take any medicines.  Get one reading in the evening (p.m.) before supper.  Take at  least 2 readings with each blood pressure check. This makes sure the results are correct. Wait 1-2 minutes between measurements.  Write down the results in the spaces on this form.  Repeat this once a week, or as told by your health care provider.  Make a follow-up appointment with your health care provider to discuss the results. Blood pressure log Date: _______________________  a.m. _____________________(1st reading) _____________________(2nd reading)  p.m. _____________________(1st reading) _____________________(2nd reading) Date: _______________________  a.m. _____________________(1st reading) _____________________(2nd reading)  p.m. _____________________(1st reading) _____________________(2nd reading) Date: _______________________  a.m. _____________________(1st reading) _____________________(2nd reading)  p.m. _____________________(1st reading) _____________________(2nd reading) Date: _______________________  a.m. _____________________(1st reading) _____________________(2nd reading)  p.m. _____________________(1st reading) _____________________(2nd reading) Date: _______________________  a.m. _____________________(1st reading) _____________________(2nd reading)  p.m. _____________________(1st reading) _____________________(2nd reading) This information is not intended to replace advice given to you by your health care provider. Make sure you discuss any questions you  have with your health care provider. Document Released: 05/29/2003 Document Revised: 10/28/2017 Document Reviewed: 08/30/2017 Elsevier Patient Education  2020 [employer]

## 2019-07-09 ENCOUNTER — Other Ambulatory Visit: Payer: Self-pay | Admitting: *Deleted

## 2019-07-09 ENCOUNTER — Telehealth: Payer: Self-pay | Admitting: Cardiovascular Disease

## 2019-07-09 MED ORDER — ROSUVASTATIN CALCIUM 5 MG PO TABS: 5.0000 mg | ORAL_TABLET | Freq: Every day | ORAL | 3 refills | Status: DC

## 2019-07-09 NOTE — Telephone Encounter (Signed)
Requested Prescriptions  ° °Signed Prescriptions Disp Refills  °• rosuvastatin (CRESTOR) 5 MG tablet 90 tablet 3  °  Sig: Take 1 tablet (5 mg total) by mouth daily.  °  Authorizing Provider: GOLLAN, TIMOTHY J  °  Ordering User: Anberlin Diez C  ° ° °

## 2019-07-09 NOTE — Telephone Encounter (Signed)
Requested Prescriptions  ° °Signed Prescriptions Disp Refills  °• rosuvastatin (CRESTOR) 5 MG tablet 90 tablet 3  °  Sig: Take 1 tablet (5 mg total) by mouth daily.  °  Authorizing Provider: GOLLAN, TIMOTHY J  °  Ordering User: LOPEZ, MARINA C  ° ° °

## 2019-07-09 NOTE — Telephone Encounter (Signed)
°*  STAT* If patient is at the pharmacy, call can be transferred to refill team.   1. Which medications need to be refilled? (please list name of each medication and dose if known) rosuvastatin (CRESTOR) 5 MG 1 tablet daily   2. Which pharmacy/location (including street and city if local pharmacy) is medication to be sent to? [facility] pharmacy   3. Do they need a 30 day or 90 day supply? 90 day

## 2019-07-22 ENCOUNTER — Other Ambulatory Visit: Payer: Self-pay | Admitting: Family Medicine

## 2019-07-23 NOTE — Telephone Encounter (Signed)
Requested medication (s) are due for refill today: yes  Requested medication (s) are on the active medication list: yes  Last refill:  06/16/2019  Future visit scheduled: no  Notes to clinic:  Review for refill   Requested Prescriptions  Pending Prescriptions Disp Refills   meloxicam (MOBIC) 15 MG tablet [Pharmacy Med Name: Meloxicam 15 MG Oral Tablet] 90 tablet 0    Sig: Take 1 tablet by mouth once daily     Analgesics:  COX2 Inhibitors Failed - 07/22/2019  9:01 AM      Failed - HGB in normal range and within 360 days    Hemoglobin  Date Value Ref Range Status  09/28/2017 14.2 13.0 - 18.0 g/dL Final  05/10/2017 13.2 13.0 - 17.7 g/dL Final         Failed - Cr in normal range and within 360 days    Creatinine, Ser  Date Value Ref Range Status  09/28/2017 1.29 (H) 0.61 - 1.24 mg/dL Final         Passed - Patient is not pregnant      Passed - Valid encounter within last 12 months    Recent Outpatient Visits          6 months ago Acute right-sided low back pain without sciatica   [place], Megan P, DO   1 year ago Impingement syndrome of left shoulder   [facility] Kathrine Haddock, NP   1 year ago LLQ pain   [facility], [place]   2 years ago Benign hypertension   [place], [place], [place]   2 years ago Chronic midline low back pain with right-sided sciatica   [place], [place], [place]

## 2019-07-23 NOTE — Telephone Encounter (Signed)
Routing to provider  

## 2019-08-06 ENCOUNTER — Ambulatory Visit: Payer: Self-pay | Admitting: Nurse Practitioner

## 2019-08-06 ENCOUNTER — Ambulatory Visit: Payer: Managed Care, Other (non HMO) | Admitting: Nurse Practitioner

## 2019-08-07 ENCOUNTER — Ambulatory Visit: Payer: Managed Care, Other (non HMO) | Admitting: Family Medicine

## 2019-10-15 ENCOUNTER — Other Ambulatory Visit: Payer: Managed Care, Other (non HMO)

## 2019-10-15 ENCOUNTER — Ambulatory Visit: Payer: Managed Care, Other (non HMO) | Attending: Internal Medicine

## 2019-10-15 DIAGNOSIS — Z20822 Contact with and (suspected) exposure to covid-19: Secondary | ICD-10-CM

## 2019-10-16 LAB — NOVEL CORONAVIRUS, NAA: SARS-CoV-2, NAA: NOT DETECTED

## 2019-11-11 ENCOUNTER — Other Ambulatory Visit: Payer: Self-pay | Admitting: Family Medicine

## 2020-01-30 ENCOUNTER — Ambulatory Visit: Payer: 59 | Attending: Internal Medicine

## 2020-01-30 DIAGNOSIS — Z20822 Contact with and (suspected) exposure to covid-19: Secondary | ICD-10-CM

## 2020-01-31 LAB — NOVEL CORONAVIRUS, NAA: SARS-CoV-2, NAA: NOT DETECTED

## 2020-01-31 LAB — SARS-COV-2, NAA 2 DAY TAT

## 2020-02-10 ENCOUNTER — Other Ambulatory Visit: Payer: Self-pay | Admitting: Family Medicine

## 2020-02-10 NOTE — Telephone Encounter (Signed)
Courtesy RF Requested Prescriptions  Pending Prescriptions Disp Refills  . meloxicam (MOBIC) 15 MG tablet [Pharmacy Med Name: Meloxicam 15 MG Oral Tablet] 30 tablet 0    Sig: Take 1 tablet by mouth once daily     Analgesics:  COX2 Inhibitors Failed - 02/10/2020 10:31 AM      Failed - HGB in normal range and within 360 days    Hemoglobin  Date Value Ref Range Status  09/28/2017 14.2 13.0 - 18.0 g/dL Final  18/56/3149 70.2 13.0 - 17.7 g/dL Final         Failed - Cr in normal range and within 360 days    Creatinine, Ser  Date Value Ref Range Status  09/28/2017 1.29 (H) 0.61 - 1.24 mg/dL Final         Failed - Valid encounter within last 12 months    Recent Outpatient Visits          1 year ago Acute right-sided low back pain without sciatica   [facility] [place], Megan P, DO   1 year ago Impingement syndrome of left shoulder   [facility] [place] Cirri, NP   2 years ago LLQ pain   [facility] [place] M[place], [place]   2 years ago Benign hypertension   [facility] [place], [place], DO   3 years ago Chronic midline low back pain with right-sided sciatica   [facility] [place], [place]             Passed - Patient is not pregnant

## 2020-03-04 ENCOUNTER — Encounter: Payer: Self-pay | Admitting: Nurse Practitioner

## 2020-03-04 ENCOUNTER — Ambulatory Visit (INDEPENDENT_AMBULATORY_CARE_PROVIDER_SITE_OTHER): Payer: 59 | Admitting: Nurse Practitioner

## 2020-03-04 ENCOUNTER — Other Ambulatory Visit: Payer: Self-pay

## 2020-03-04 VITALS — BP 174/96 | HR 53 | Temp 98.1°F | Ht 69.0 in | Wt 192.0 lb

## 2020-03-04 DIAGNOSIS — R1032 Left lower quadrant pain: Secondary | ICD-10-CM

## 2020-03-04 DIAGNOSIS — G479 Sleep disorder, unspecified: Secondary | ICD-10-CM

## 2020-03-04 DIAGNOSIS — I25118 Atherosclerotic heart disease of native coronary artery with other forms of angina pectoris: Secondary | ICD-10-CM

## 2020-03-04 DIAGNOSIS — R1031 Right lower quadrant pain: Secondary | ICD-10-CM | POA: Diagnosis not present

## 2020-03-04 DIAGNOSIS — E785 Hyperlipidemia, unspecified: Secondary | ICD-10-CM

## 2020-03-04 DIAGNOSIS — M5440 Lumbago with sciatica, unspecified side: Secondary | ICD-10-CM | POA: Diagnosis not present

## 2020-03-04 DIAGNOSIS — I10 Essential (primary) hypertension: Secondary | ICD-10-CM

## 2020-03-04 DIAGNOSIS — R198 Other specified symptoms and signs involving the digestive system and abdomen: Secondary | ICD-10-CM

## 2020-03-04 LAB — UA/M W/RFLX CULTURE, ROUTINE
Bilirubin, UA: NEGATIVE
Glucose, UA: NEGATIVE
Ketones, UA: NEGATIVE
Leukocytes,UA: NEGATIVE
Nitrite, UA: NEGATIVE
Protein,UA: NEGATIVE
RBC, UA: NEGATIVE
Specific Gravity, UA: 1.025 (ref 1.005–1.030)
Urobilinogen, Ur: 0.2 mg/dL (ref 0.2–1.0)
pH, UA: 6.5 (ref 5.0–7.5)

## 2020-03-04 MED ORDER — CLONAZEPAM 0.5 MG PO TABS: 0.5000 mg | ORAL_TABLET | Freq: Every evening | ORAL | 1 refills | Status: DC | PRN

## 2020-03-04 MED ORDER — MELOXICAM 15 MG PO TABS: 15.0000 mg | ORAL_TABLET | Freq: Every day | ORAL | 1 refills | Status: DC

## 2020-03-04 MED ORDER — CLONAZEPAM 0.5 MG PO TABS: 0.5000 mg | ORAL_TABLET | Freq: Every day | ORAL | 1 refills | Status: DC

## 2020-03-04 NOTE — Patient Instructions (Signed)

## 2020-03-04 NOTE — Progress Notes (Signed)
BP (!) 174/96 (BP Location: Right Arm, Patient Position: Sitting, Cuff Size: Normal)   Pulse (!) 53   Temp 98.1 F (36.7 C) (Oral)   Ht 5\' 9"  (1.753 m)   Wt 192 lb (87.1 kg)   SpO2 99%   BMI 28.35 kg/m    Subjective:    Patient ID: Jeff Fields, male    DOB: 12-10-1960, 59 y.o.   MRN: [id number]  HPI: Jeff Fields is a 59 y.o. male presenting with lower abdominal burning as well as insomnia issues.   Chief Complaint  Patient presents with  . Abdominal Pain    Burning sensation in lower abdomen  . Prostate Check    Patient feels like he is swollen.   . Insomnia  . Fatigue   ABDOMINAL PAIN  Patient has had abdominal pain for about 1 week, describes as a burning sensation.  Also reports a sensation of rectal fullness and history of hemorrhoids. Duration:weeks Onset: sudden Severity: mild Quality: burning Location:  RLQ and LLQ Episode duration: seconds Radiation: no Frequency: intermittent Alleviating factors: nothing tried Aggravating factors: nothing tried Status: better Treatments attempted: nothing Fever: no Nausea: no Vomiting: no Weight loss: no Decreased appetite: no Diarrhea: no Constipation: no  BM per day: 1-2 per day; no change in color or size Blood in stool: no Heartburn: no Jaundice: no Rash: no Dysuria/urinary frequency: no  Weak stream: yes Hematuria: no History of sexually transmitted disease: no Recurrent NSAID use: yes  ANXIETY/STRESS Patient says that his mind races when he lays down at night.  He reports taking a medication in the past that helped with the "mind swirling".  Does not think he has chronic problems with anxiety or depression. Duration: uncontrolled Anxious mood: yes  Excessive worrying: yes Irritability: no  Sweating: no Nausea: no Palpitations:no Hyperventilation: no Panic attacks: no Agoraphobia: no  Obscessions/compulsions: no Depressed mood: no Depression screen [facility] 2/9 03/04/2020 01/01/2019 05/10/2017  Decreased  Interest 0 0 0  Down, Depressed, Hopeless 0 0 0  PHQ - 2 Score 0 0 0  Altered sleeping 2 - 2  Tired, decreased energy 2 - 2  Change in appetite 0 - 0  Feeling bad or failure about yourself  0 - 0  Trouble concentrating 0 - 0  Moving slowly or fidgety/restless 0 - 0  Suicidal thoughts 0 - 0  PHQ-9 Score 4 - 4   GAD 7 : Generalized Anxiety Score 03/04/2020  Nervous, Anxious, on Edge 1  Control/stop worrying 1  Worry too much - different things 1  Trouble relaxing 0  Restless 0  Easily annoyed or irritable 1  Afraid - awful might happen 1  Total GAD 7 Score 5  Anxiety Difficulty Not difficult at all   Anhedonia: no Weight changes: no Insomnia: yes hard to fall asleep and stay asleep  Hypersomnia: no Fatigue/loss of energy: yes Feelings of worthlessness: no Feelings of guilt: no Impaired concentration/indecisiveness: no Suicidal ideations: no  Crying spells: no Recent Stressors/Life Changes: yes   Relationship problems: no   Family stress: no     Financial stress: no    Job stress: yes ; reports his job recently changed his hours and he now has to go in at 5 in the morning.  He works in [place].   Recent death/loss: no  INSOMNIA Patient reports taking a medication in the past that really helped take away the feeling that his mind is racing. Duration: chronic Satisfied with sleep quality: no Difficulty  falling asleep: yes Difficulty staying asleep: no Waking a few hours after sleep onset: yes Early morning awakenings: yes Daytime hypersomnolence: yes Wakes feeling refreshed: no Good sleep hygiene: yes Apnea: no Snoring: no Depressed/anxious mood: no Recent stress: no Restless legs/nocturnal leg cramps: no Chronic pain/arthritis: yes; managed on Meloxicam History of sleep study: no Treatments attempted: has tried Ambien and Melatonin in the past reports trying a bunch of medication in the past that "never worked."  SHOULDER AND BACK PAIN Patient has been  taking Meloxicam for years for back pain and reports more recently, he has been taking more than he would like.  He sees an Orthopedic provider for bilateral shoulder pain that has been a chronic issue and has received injections in the past.  He has not been in to see Orthopedics in a "while." Duration: chronic Involved shoulder: bilateral  Back location: lower Mechanism of injury: unknown Location: low back, bilateral shoulders diffusely Onset:gradual Severity: moderate  Quality:  Aching, dull Frequency: nightly Radiation: yes; back pain radiates down right leg; shoulder pain does not radiate Aggravating factors: laying on it, too much strenuous heavy lifting  Alleviating factors:  Physical therapy and injections Status: stable Treatments attempted: Meloxicam, ice, heat,    Relief with NSAIDs?:  significant Weakness: no Numbness: no Decreased grip strength: no Redness: no Swelling: no Bruising: no Fevers: no  No Known Allergies  Outpatient Encounter Medications as of 03/04/2020  Medication Sig  . aspirin 81 MG tablet Take 81 mg by mouth daily.  . carvedilol (COREG) 25 MG tablet Take 1 tablet (25 mg total) by mouth 2 (two) times daily. Please schedule follow up appointment.  . clonazePAM (KLONOPIN) 0.5 MG tablet Take 1 tablet (0.5 mg total) by mouth at bedtime as needed for anxiety.  . Coenzyme Q10 (COQ-10) 200 MG CAPS Take 200 mg by mouth daily.  . meloxicam (MOBIC) 15 MG tablet Take 1 tablet (15 mg total) by mouth daily.  . rosuvastatin (CRESTOR) 5 MG tablet Take 1 tablet (5 mg total) by mouth daily.  . [DISCONTINUED] clonazePAM (KLONOPIN) 0.5 MG tablet Take 1 tablet (0.5 mg total) by mouth at bedtime.  . [DISCONTINUED] clonazePAM (KLONOPIN) 0.5 MG tablet Take 1 tablet (0.5 mg total) by mouth at bedtime.  . [DISCONTINUED] meloxicam (MOBIC) 15 MG tablet Take 1 tablet by mouth once daily  . cyclobenzaprine (FLEXERIL) 10 MG tablet Take 1 tablet (10 mg total) by mouth at bedtime.  (Patient not taking: Reported on 03/04/2020)   No facility-administered encounter medications on file as of 03/04/2020.   Patient Active Problem List   Diagnosis Date Noted  . Abdominal pain, left lower quadrant 03/07/2020  . Anxiety 02/12/2015  . Benign hypertension 02/12/2015  . Back muscle spasm 02/12/2015  . Disorder of male genital organ 02/12/2015  . Sleep disturbance 02/12/2015  . Sprain of wrist 02/12/2015  . CAD (coronary artery disease) 11/27/2014  . S/P coronary artery stent placement 11/27/2014  . Hyperlipidemia 11/27/2014  . Angina pectoris (HCC) 09/17/2014  . Smoking hx 09/17/2014  . 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 Low back pain 07/19/2005   Past Medical History:  Diagnosis Date  . Coronary artery disease    a. LHC 10/2014: mid LAD 99% stenosis s/p PCI/DES, 40% stenosis in the proximal LAD, D1 40% stenosis at the ostium of the vessel, EF > 55%  . Hyperlipidemia   . Hypertension    Relevant past medical, surgical, family and social history reviewed and updated as indicated. Interim medical history since  our last visit reviewed.  Review of Systems  Constitutional: Negative.  Negative for activity change, appetite change and fever.  Cardiovascular: Negative.  Negative for chest pain, palpitations and leg swelling.  Gastrointestinal: Positive for abdominal pain. Negative for anal bleeding, blood in stool, constipation, diarrhea, nausea, rectal pain and vomiting.  Genitourinary: Negative.  Negative for decreased urine volume, difficulty urinating, discharge, dysuria, flank pain, frequency, hematuria, penile swelling, scrotal swelling and urgency.  Musculoskeletal: Positive for arthralgias and back pain. Negative for gait problem, joint swelling, myalgias, neck pain and neck stiffness.  Skin: Negative.  Negative for color change, pallor and rash.  Neurological: Negative.  Negative for weakness, numbness and headaches.  Psychiatric/Behavioral: Positive for sleep disturbance.  Negative for confusion, decreased concentration and suicidal ideas. The patient is not nervous/anxious.     Per HPI unless specifically indicated above     Objective:    BP (!) 174/96 (BP Location: Right Arm, Patient Position: Sitting, Cuff Size: Normal)   Pulse (!) 53   Temp 98.1 F (36.7 C) (Oral)   Ht 5\' 9"  (1.753 m)   Wt 192 lb (87.1 kg)   SpO2 99%   BMI 28.35 kg/m   Wt Readings from Last 3 Encounters:  03/04/20 192 lb (87.1 kg)  04/30/19 196 lb (88.9 kg)  06/02/18 191 lb 9.6 oz (86.9 kg)    Physical Exam Vitals and nursing note reviewed. Exam conducted with a chaperone present.  Constitutional:      General: He is not in acute distress.    Appearance: He is well-developed. He is not toxic-appearing.  Cardiovascular:     Rate and Rhythm: Regular rhythm. Bradycardia present.     Heart sounds: Normal heart sounds. No murmur heard.   Abdominal:     General: Abdomen is flat. Bowel sounds are normal. There is no distension.     Palpations: Abdomen is soft. There is no mass.     Tenderness: There is no abdominal tenderness. There is no right CVA tenderness, left CVA tenderness or guarding.  Genitourinary:    Prostate: Normal. Not tender.     Rectum: Normal.  Musculoskeletal:        General: Normal range of motion.     Right lower leg: No edema.     Left lower leg: No edema.  Skin:    General: Skin is warm and dry.     Coloration: Skin is not jaundiced or pale.  Neurological:     General: No focal deficit present.     Mental Status: He is alert and oriented to person, place, and time.     Motor: No weakness.     Gait: Gait normal.  Psychiatric:        Mood and Affect: Mood normal.        Behavior: Behavior normal.        Thought Content: Thought content normal.        Judgment: Judgment normal.     Results for orders placed or performed in visit on 03/04/20  UA/M w/rflx Culture, Routine   Specimen: Urine   Urine  Result Value Ref Range   Specific Gravity,  UA 1.025 1.005 - 1.030   pH, UA 6.5 5.0 - 7.5   Color, UA Yellow Yellow   Appearance Ur Clear Clear   Leukocytes,UA Negative Negative   Protein,UA Negative Negative/Jeff   Glucose, UA Negative Negative   Ketones, UA Negative Negative   RBC, UA Negative Negative   Bilirubin, UA Negative Negative  Urobilinogen, Ur 0.2 0.2 - 1.0 mg/dL   Nitrite, UA Negative Negative  Comprehensive metabolic panel  Result Value Ref Range   Glucose 94 65 - 99 mg/dL   BUN 19 6 - 24 mg/dL   Creatinine, Ser 1.04 0.76 - 1.27 mg/dL   GFR calc non Af Amer 79 >59 mL/min/1.73   GFR calc Af Amer 91 >59 mL/min/1.73   BUN/Creatinine Ratio 18 9 - 20   Sodium 138 134 - 144 mmol/L   Potassium 4.5 3.5 - 5.2 mmol/L   Chloride 101 96 - 106 mmol/L   CO2 25 20 - 29 mmol/L   Calcium 10.0 8.7 - 10.2 mg/dL   Total Protein 7.3 6.0 - 8.5 g/dL   Albumin 5.0 (H) 3.8 - 4.9 g/dL   Globulin, Total 2.3 1.5 - 4.5 g/dL   Albumin/Globulin Ratio 2.2 1.2 - 2.2   Bilirubin Total 1.1 0.0 - 1.2 mg/dL   Alkaline Phosphatase 73 48 - 121 IU/L   AST 26 0 - 40 IU/L   ALT 21 0 - 44 IU/L  Lipid Panel w/o Chol/HDL Ratio  Result Value Ref Range   Cholesterol, Total 151 100 - 199 mg/dL   Triglycerides 120 0 - 149 mg/dL   HDL 48 >39 mg/dL   VLDL Cholesterol Cal 21 5 - 40 mg/dL   LDL Chol Calc (NIH) 82 0 - 99 mg/dL  PSA  Result Value Ref Range   Prostate Specific Ag, Serum 1.2 0.0 - 4.0 ng/mL  CBC with Differential/Platelet  Result Value Ref Range   WBC 8.4 3.4 - 10.8 x10E3/uL   RBC 4.53 4.14 - 5.80 x10E6/uL   Hemoglobin 14.8 13.0 - 17.7 g/dL   Hematocrit 41.4 37.5 - 51.0 %   MCV 91 79 - 97 fL   MCH 32.7 26.6 - 33.0 pg   MCHC 35.7 31 - 35 g/dL   RDW 12.0 11.6 - 15.4 %   Platelets 230 150 - 450 x10E3/uL   Neutrophils 63 Not Estab. %   Lymphs 23 Not Estab. %   Monocytes 10 Not Estab. %   Eos 3 Not Estab. %   Basos 1 Not Estab. %   Neutrophils Absolute 5.3 1 - 7 x10E3/uL   Lymphocytes Absolute 1.9 0 - 3 x10E3/uL   Monocytes  Absolute 0.8 0 - 0 x10E3/uL   EOS (ABSOLUTE) 0.3 0.0 - 0.4 x10E3/uL   Basophils Absolute 0.0 0 - 0 x10E3/uL   Immature Granulocytes 0 Not Estab. %   Immature Grans (Abs) 0.0 0.0 - 0.1 x10E3/uL      Assessment & Plan:   Problem List Items Addressed This Visit      Cardiovascular and Mediastinum   CAD (coronary artery disease)    Chronic, stable.  No symptoms at present.  Continue to follow with Cardiology.  Continue lifestyle and medication regimen.      Benign hypertension    Chronic, ongoing.  Possibly due to missing second dose of BP medication today.  Advised to follow up with Cardiology.  CMP, CBC checked today.  With any sudden onset of chest pain or shortness of breath, go to ED.        Other   Hyperlipidemia    Chronic, stable.  Follows with Cardiology for cholesterol and blood pressure.  Encouraged to schedule follow up with them in near future for continued maintenance.  Lipids, CMP, CBC checked today.      Relevant Orders   Comprehensive metabolic panel (Completed)   Lipid Panel  w/o Chol/HDL Ratio (Completed)   CBC with Differential/Platelet (Completed)   Low back pain    Chronic, ongoing.  Well controlled with Meloxicam in past but has recently run out.  Will refill.  CMP checked today.      Relevant Medications   meloxicam (MOBIC) 15 MG tablet   Sleep disturbance    Chronic, ongoing.  RP and PT are aware of risks of psychoactive medication use to include increased sedation, respiratory suppression, falls, extrapyramidal movements,  dependence and cardiovascular events.  RP and PT would like to continue treatment as benefit determined to outweigh risk.  Refill given for clonazepam 0.5 mg nightly as needed for sleep.  Patient to use very sparingly - if more of a daily medication is needed will need to return to office for further evaluation.  Despite GAD-7 elevation today, patient does not believe he has chronic anxiety issues and does not desire anxiety treatment at  this time.       Relevant Medications   clonazePAM (KLONOPIN) 0.5 MG tablet   Abdominal pain, left lower quadrant - Primary    Acute, ongoing.  UA negative in office today.  Unclear etiology, although seems likely burning could be related to defecation and/or gas.  CBC, CMP, PSA checked today.  Offered abdominal ultrasound, patient declined for now.  Educated on red flags like diarrhea, constipation, blood in the stool and to return to clinic if these occur.      Relevant Orders   UA/M w/rflx Culture, Routine (Completed)    Other Visit Diagnoses    Abdominal pain, right lower quadrant       Relevant Orders   UA/M w/rflx Culture, Routine (Completed)   CBC with Differential/Platelet (Completed)   Rectal fullness       Rectal examination benign today.  No red flags on examination, and with history of hemorrhoids, likely etiology.  PSA checked today.   Relevant Orders   PSA (Completed)       Follow up plan: Return in about 6 months (around 09/03/2020) for follow up.

## 2020-03-05 LAB — COMPREHENSIVE METABOLIC PANEL
ALT: 21 IU/L (ref 0–44)
AST: 26 IU/L (ref 0–40)
Albumin/Globulin Ratio: 2.2 (ref 1.2–2.2)
Albumin: 5 g/dL — ABNORMAL HIGH (ref 3.8–4.9)
Alkaline Phosphatase: 73 IU/L (ref 48–121)
BUN/Creatinine Ratio: 18 (ref 9–20)
BUN: 19 mg/dL (ref 6–24)
Bilirubin Total: 1.1 mg/dL (ref 0.0–1.2)
CO2: 25 mmol/L (ref 20–29)
Calcium: 10 mg/dL (ref 8.7–10.2)
Chloride: 101 mmol/L (ref 96–106)
Creatinine, Ser: 1.04 mg/dL (ref 0.76–1.27)
GFR calc Af Amer: 91 mL/min/{1.73_m2} (ref >59)
GFR calc non Af Amer: 79 mL/min/{1.73_m2} (ref >59)
Globulin, Total: 2.3 g/dL (ref 1.5–4.5)
Glucose: 94 mg/dL (ref 65–99)
Potassium: 4.5 mmol/L (ref 3.5–5.2)
Sodium: 138 mmol/L (ref 134–144)
Total Protein: 7.3 g/dL (ref 6.0–8.5)

## 2020-03-05 LAB — CBC WITH DIFFERENTIAL/PLATELET
Basophils Absolute: 0 10*3/uL (ref 0.0–0.2)
Basos: 1 %
EOS (ABSOLUTE): 0.3 10*3/uL (ref 0.0–0.4)
Eos: 3 %
Hematocrit: 41.4 % (ref 37.5–51.0)
Hemoglobin: 14.8 g/dL (ref 13.0–17.7)
Immature Grans (Abs): 0 10*3/uL (ref 0.0–0.1)
Immature Granulocytes: 0 %
Lymphocytes Absolute: 1.9 10*3/uL (ref 0.7–3.1)
Lymphs: 23 %
MCH: 32.7 pg (ref 26.6–33.0)
MCHC: 35.7 g/dL (ref 31.5–35.7)
MCV: 91 fL (ref 79–97)
Monocytes Absolute: 0.8 10*3/uL (ref 0.1–0.9)
Monocytes: 10 %
Neutrophils Absolute: 5.3 10*3/uL (ref 1.4–7.0)
Neutrophils: 63 %
Platelets: 230 10*3/uL (ref 150–450)
RBC: 4.53 x10E6/uL (ref 4.14–5.80)
RDW: 12 % (ref 11.6–15.4)
WBC: 8.4 10*3/uL (ref 3.4–10.8)

## 2020-03-05 LAB — LIPID PANEL W/O CHOL/HDL RATIO
Cholesterol, Total: 151 mg/dL (ref 100–199)
HDL: 48 mg/dL (ref >39)
LDL Chol Calc (NIH): 82 mg/dL (ref 0–99)
Triglycerides: 120 mg/dL (ref 0–149)
VLDL Cholesterol Cal: 21 mg/dL (ref 5–40)

## 2020-03-05 LAB — PSA: Prostate Specific Ag, Serum: 1.2 ng/mL (ref 0.0–4.0)

## 2020-03-07 ENCOUNTER — Telehealth: Payer: Self-pay | Admitting: Family Medicine

## 2020-03-07 DIAGNOSIS — R1032 Left lower quadrant pain: Secondary | ICD-10-CM | POA: Insufficient documentation

## 2020-03-07 NOTE — Assessment & Plan Note (Addendum)
Acute, ongoing.  UA negative in office today.  Unclear etiology, although seems likely burning could be related to defecation and/or gas.  CBC, CMP, PSA checked today.  KUB x-ray ordered.  Educated on red flags like diarrhea, constipation, blood in the stool and to return to clinic if these occur.

## 2020-03-07 NOTE — Assessment & Plan Note (Signed)
Chronic, ongoing.  Well controlled with Meloxicam in past but has recently run out.  Will refill.  CMP checked today.

## 2020-03-07 NOTE — Assessment & Plan Note (Signed)
Chronic, ongoing.  Possibly due to missing second dose of BP medication today.  Advised to follow up with Cardiology.  CMP, CBC checked today.  With any sudden onset of chest pain or shortness of breath, go to ED.

## 2020-03-07 NOTE — Telephone Encounter (Signed)
Patient notified of lab results

## 2020-03-07 NOTE — Assessment & Plan Note (Addendum)
Chronic, stable.  Follows with Cardiology for cholesterol and blood pressure.  Encouraged to schedule follow up with them in near future for continued maintenance.  Lipids, CMP, CBC checked today.

## 2020-03-07 NOTE — Assessment & Plan Note (Signed)
Chronic, ongoing.  RP and PT are aware of risks of psychoactive medication use to include increased sedation, respiratory suppression, falls, extrapyramidal movements,  dependence and cardiovascular events.  RP and PT would like to continue treatment as benefit determined to outweigh risk.  Refill given for clonazepam 0.5 mg nightly as needed for sleep.  Patient to use very sparingly - if more of a daily medication is needed will need to return to office for further evaluation.  Despite GAD-7 elevation today, patient does not believe he has chronic anxiety issues and does not desire anxiety treatment at this time.

## 2020-03-07 NOTE — Telephone Encounter (Signed)
Copied from CRM [id number]. Topic: General - Other >> Mar 07, 2020  9:01 AM Tamela Oddi wrote: Reason for CRM: Patient is returning a call he said regarding some blood work.  Please call patient back at [phone]

## 2020-03-07 NOTE — Assessment & Plan Note (Addendum)
Chronic, stable.  No symptoms at present.  Continue to follow with Cardiology.  Continue lifestyle and medication regimen.

## 2020-03-13 ENCOUNTER — Telehealth: Payer: Self-pay | Admitting: Family Medicine

## 2020-03-13 NOTE — Telephone Encounter (Signed)
Would need appointment to discuss change in medication.

## 2020-03-13 NOTE — Telephone Encounter (Signed)
Pt stated he "was just here" and stated he felt like he didn't need an apt. Pt refused to schedule apt at this time and asked why he neededs an apt to discuss this if he has already discussed this at prev apt.

## 2020-03-13 NOTE — Telephone Encounter (Signed)
Pt called stating that the mediation that he was prescribed for sleep is not helping him sleep all the way through the night. Pt is requesting to have something else sent in for him. Please advise.      [facility] [address] [place] - [address]  [address] [place] [address]  Phone: [phone] Fax: [phone]  Hours: Not open 24 hours

## 2020-04-01 ENCOUNTER — Ambulatory Visit: Payer: 59 | Admitting: Family

## 2020-04-01 ENCOUNTER — Encounter: Payer: Self-pay | Admitting: *Deleted

## 2020-04-01 ENCOUNTER — Encounter: Payer: Self-pay | Admitting: Family

## 2020-04-01 ENCOUNTER — Other Ambulatory Visit: Payer: Self-pay

## 2020-04-01 VITALS — BP 140/80 | HR 60 | Ht 69.0 in | Wt 186.5 lb

## 2020-04-01 DIAGNOSIS — I25118 Atherosclerotic heart disease of native coronary artery with other forms of angina pectoris: Secondary | ICD-10-CM

## 2020-04-01 DIAGNOSIS — I10 Essential (primary) hypertension: Secondary | ICD-10-CM

## 2020-04-01 DIAGNOSIS — E782 Mixed hyperlipidemia: Secondary | ICD-10-CM

## 2020-04-01 MED ORDER — CARVEDILOL 25 MG PO TABS: 25.0000 mg | ORAL_TABLET | Freq: Two times a day (BID) | ORAL | 3 refills | Status: DC

## 2020-04-01 MED ORDER — HYDROCHLOROTHIAZIDE 25 MG PO TABS: 25.0000 mg | ORAL_TABLET | Freq: Every day | ORAL | 2 refills | Status: DC

## 2020-04-01 MED ORDER — ROSUVASTATIN CALCIUM 5 MG PO TABS: 5.0000 mg | ORAL_TABLET | Freq: Every day | ORAL | 1 refills | Status: DC

## 2020-04-01 NOTE — Progress Notes (Signed)
Office Visit    Patient Name: Jeff Fields Date of Encounter: 04/01/2020  Primary Care Provider:  Dorcas Carrow, DO Primary Cardiologist:  No primary care provider on file. Electrophysiologist:  None   Chief Complaint    Jeff Fields is a 59 y.o. male with a hx of CAD, HTN, HLD presents today for follow-up of CAD  Past Medical History    Past Medical History:  Diagnosis Date  . Coronary artery disease    a. LHC 10/2014: mid LAD 99% stenosis s/p PCI/DES, 40% stenosis in the proximal LAD, D1 40% stenosis at the ostium of the vessel, EF > 55%  . Hyperlipidemia   . Hypertension    Past Surgical History:  Procedure Laterality Date  . CARDIAC CATHETERIZATION  11/08/2014  . CORONARY ANGIOPLASTY WITH STENT PLACEMENT  11/08/2014   drug eluting stent placement to the mid LAD  . VASECTOMY  2000    Allergies  No Known Allergies  History of Present Illness    Jeff Fields is a 59 y.o. male with a hx of CAD, HTN, HLD last seen 04/2019 by Dr. Mariah Milling.  He underwent cardiac catheterization with stent placement to mid LAD February 2016.  He has previously been intolerant to atorvastatin 40 mg daily but has tolerated Crestor 5 mg daily without difficulty.  He reports feeling overall well.  He eats mostly at home and wife does most of the cooking.  He has lost 10 pounds over the last year.  Tells me his wife has told him that they are going to be eating a more heart healthy diet as they have gotten a bit off track.  Reports no chest pain, pressure, tightness.  Reports no shortness of breath at rest nor dyspnea on exertion.  Blood pressure when checked at home with an arm cuff is routinely 140/80.  He is interested in additional antihypertensive medication.  EKGs/Labs/Other Studies Reviewed:   The following studies were reviewed today:  EKG:  EKG is ordered today.  The ekg ordered today demonstrates sinus rhythm 60 bpm with no acute ST/T wave changes.  Recent Labs: 03/04/2020: ALT 21;  BUN 19; Creatinine, Ser 1.04; Hemoglobin 14.8; Platelets 230; Potassium 4.5; Sodium 138  Recent Lipid Panel    Component Value Date/Time   CHOL 151 03/04/2020 1653   TRIG 120 03/04/2020 1653   HDL 48 03/04/2020 1653   LDLCALC 82 03/04/2020 1653    Home Medications   Current Meds  Medication Sig  . ASHWAGANDHA PO Take by mouth daily.  Marland Kitchen aspirin 81 MG tablet Take 81 mg by mouth daily.  . carvedilol (COREG) 25 MG tablet Take 1 tablet (25 mg total) by mouth 2 (two) times daily. Please schedule follow up appointment.  . clonazePAM (KLONOPIN) 0.5 MG tablet Take 1 tablet (0.5 mg total) by mouth at bedtime as needed for anxiety.  . Coenzyme Q10 (COQ-10) 200 MG CAPS Take 200 mg by mouth daily.  . meloxicam (MOBIC) 15 MG tablet Take 1 tablet (15 mg total) by mouth daily.  . rosuvastatin (CRESTOR) 5 MG tablet Take 1 tablet (5 mg total) by mouth daily.      Review of Systems      Review of Systems  Constitutional: Negative for chills, fever and malaise/fatigue.  Cardiovascular: Negative for chest pain, dyspnea on exertion, leg swelling, near-syncope, orthopnea, palpitations and syncope.  Respiratory: Negative for cough, shortness of breath and wheezing.   Gastrointestinal: Negative for nausea and vomiting.  Neurological:  Negative for dizziness, light-headedness and weakness.   All other systems reviewed and are otherwise negative except as noted above.  Physical Exam    VS:  BP 140/80 (BP Location: Left Arm, Patient Position: Sitting, Cuff Size: Normal)   Pulse 60   Ht 5\' 9"  (1.753 m)   Wt 186 lb 8 oz (84.6 kg)   SpO2 98%   BMI 27.54 kg/m  , BMI Body mass index is 27.54 kg/m. GEN: Well nourished, well developed, in no acute distress. HEENT: normal. Neck: Supple, no JVD, carotid bruits, or masses. Cardiac: RRR, no murmurs, rubs, or gallops. No clubbing, cyanosis, edema.  Radials/DP/PT 2+ and equal bilaterally.  Respiratory:  Respirations regular and unlabored, clear to  auscultation bilaterally. GI: Soft, nontender, nondistended, BS + x 4. MS: No deformity or atrophy. Skin: Warm and dry, no rash. Neuro:  Strength and sensation are intact. Psych: Normal affect.  Assessment & Plan   1. CAD -stable no anginal symptoms.  No negation for ischemic evaluation at this time.  EKG today sinus rhythm with no acute ST/T wave changes.  GDMT includes aspirin, beta-blocker, statin.  2. HLD, LDL < 70 - 03/04/20 LDL 82.  Endorses some dietary indiscretion.  Plan for dietary changes and to reassess lipid/liver panel in 2 months.  Dietary education provided.  3. HTN -BP elevated 140/80 and routinely 140/80 at home.  Plan to start hydrochlorothiazide 25 mg daily.  Plan for BMP in 2 weeks.  Continue Coreg 25 mg twice daily.  Disposition: P.m. PMI trust message to check in on blood pressure in 2 weeks.  Follow up in 2 month(s) with Dr. 03/06/20, NP 04/01/2020, 3:47 PM

## 2020-04-01 NOTE — Patient Instructions (Addendum)
Medication Instructions:  Your physician has recommended you make the following change in your medication:   START Hydrochlorothiaze  daily  *If you need a refill on your cardiac medications before your next appointment, please call your pharmacy*  Lab Work: Your physician recommends that you return for lab work in: 2 weeks for BMP  Please stop by [facility] in 2 weeks from 8am - 5:30pm for your blood work. This lets [place] check your kidneys and electrolytes after starting a new medication. You do not need to be fasting. You do not need an appointment.  If you have labs (blood work) drawn today and your tests are completely normal, you will receive your results only by: Marland Kitchen MyChart Message (if you have MyChart) OR . A paper copy in the mail If you have any lab test that is abnormal or we need to change your treatment, we will call you to review the results.  Testing/Procedures: Your EKG today shows normal sinus rhythm.   Your LDL or "lousy cholesterol" was 84 on recent check and we want it to be less than 70. Information on lipid lowering diet below. We will plan to recheck in approximately 2 months.   Follow-Up: At [facility], you and your health needs are our priority.  As part of our continuing mission to provide you with exceptional heart care, we have created designated Provider Care Teams.  These Care Teams include your primary Cardiologist (physician) and Advanced Practice Providers (APPs -  Physician Assistants and Nurse Practitioners) who all work together to provide you with the care you need, when you need it.  We recommend signing up for the patient portal called "MyChart".  Sign up information is provided on this After Visit Summary.  MyChart is used to connect with patients for Virtual Visits (Telemedicine).  Patients are able to view lab/test results, encounter notes, upcoming appointments, etc.  Non-urgent messages can be sent to your provider as well.   To learn  more about what you can do with MyChart, go to [email or web address].    Your next appointment:   2 month(s)  The format for your next appointment:   In Person  Provider:    You may see Julien Nordmann, MD or one of the following Advanced Practice Providers on your designated Care Team:    Nicolasa Ducking, NP  Eula Listen, PA-C  Marisue Ivan, PA-C  Gillian Shields, NP  Other Instructions  Tips to Measure your Blood Pressure Correctly  To determine whether you have hypertension, a medical professional will take a blood pressure reading. How you prepare for the test, the position of your arm, and other factors can change a blood pressure reading by 10% or more. That could be enough to hide high blood pressure, start you on a drug you don't really need, or lead your doctor to incorrectly adjust your medications.  National and international guidelines offer specific instructions for measuring blood pressure. If a doctor, nurse, or medical assistant isn't doing it right, don't hesitate to ask him or her to get with the guidelines.  Here's what you can do to ensure a correct reading: . Don't drink a caffeinated beverage or smoke during the 30 minutes before the test. . Sit quietly for five minutes before the test begins. . During the measurement, sit in a chair with your feet on the floor and your arm supported so your elbow is at about heart level. . The inflatable part of the cuff should completely cover  at least 80% of your upper arm, and the cuff should be placed on bare skin, not over a shirt. . Don't talk during the measurement. . Have your blood pressure measured twice, with a brief break in between. If the readings are different by 5 points or more, have it done a third time.  There are times to break these rules. If you sometimes feel lightheaded when getting out of bed in the morning or when you stand after sitting, you should have your blood pressure checked while seated  and then while standing to see if it falls from one position to the next.  In 2017, new guidelines from the American Heart Association, the [employer] of [employer], and nine other health organizations lowered the diagnosis of high blood pressure to 130/80 mm Hg or higher for all adults. The guidelines also redefined the various blood pressure categories to now include normal, elevated, Stage 1 hypertension, Stage 2 hypertension, and hypertensive crisis (see "Blood pressure categories").  Blood pressure categories  Blood pressure category SYSTOLIC (upper number)  DIASTOLIC (lower number)  Normal Less than 120 mm Hg and Less than 80 mm Hg  Elevated 120-129 mm Hg and Less than 80 mm Hg  High blood pressure: Stage 1 hypertension 130-139 mm Hg or 80-89 mm Hg  High blood pressure: Stage 2 hypertension 140 mm Hg or higher or 90 mm Hg or higher  Hypertensive crisis (consult your doctor immediately) Higher than 180 mm Hg and/or Higher than 120 mm Hg  Source: American Heart Association and American Stroke Association. For more on getting your blood pressure under control, buy Controlling Your Blood Pressure, a Special Health Report from [facility].   Blood Pressure Log   Date   Time  Blood Pressure  Position  Example: Nov 1 9 AM 124/78 sitting                                                    Fat and Cholesterol Restricted Eating Plan Getting too much fat and cholesterol in your diet may cause health problems. Choosing the right foods helps keep your fat and cholesterol at normal levels. This can keep you from getting certain diseases. What are tips for following this plan? Meal planning  At meals, divide your plate into four equal parts: ? Fill one-half of your plate with vegetables and green salads. ? Fill one-fourth of your plate with whole grains. ? Fill one-fourth of your plate with low-fat (lean) protein foods.  Eat fish that is high in omega-3  fats at least two times a week. This includes mackerel, tuna, sardines, and salmon.  Eat foods that are high in fiber, such as whole grains, beans, apples, broccoli, carrots, peas, and barley. General tips   Work with your doctor to lose weight if you need to.  Avoid: ? Foods with added sugar. ? Fried foods. ? Foods with partially hydrogenated oils.  Limit alcohol intake to no more than 1 drink a day for nonpregnant women and 2 drinks a day for men. One drink equals 12 oz of beer, 5 oz of wine, or 1 oz of hard liquor. Reading food labels  Check food labels for: ? Trans fats. ? Partially hydrogenated oils. ? Saturated fat (g) in each serving. ? Cholesterol (mg) in each serving. ? Fiber (g) in each  serving.  Choose foods with healthy fats, such as: ? Monounsaturated fats. ? Polyunsaturated fats. ? Omega-3 fats.  Choose grain products that have whole grains. Look for the word "whole" as the first word in the ingredient list. Cooking  Cook foods using low-fat methods. These include baking, boiling, grilling, and broiling.  Eat more home-cooked foods. Eat at restaurants and buffets less often.  Avoid cooking using saturated fats, such as butter, cream, palm oil, palm kernel oil, and coconut oil. Recommended foods  Fruits  All fresh, canned (in natural juice), or frozen fruits. Vegetables  Fresh or frozen vegetables (raw, steamed, roasted, or grilled). Green salads. Grains  Whole grains, such as whole wheat or whole grain breads, crackers, cereals, and pasta. Unsweetened oatmeal, bulgur, barley, quinoa, or brown rice. Corn or whole wheat flour tortillas. Meats and other protein foods  Ground beef (85% or leaner), grass-fed beef, or beef trimmed of fat. Skinless chicken or [place]. Ground chicken or [place]. Pork trimmed of fat. All fish and seafood. Egg whites. Dried beans, peas, or lentils. Unsalted nuts or seeds. Unsalted canned beans. Nut butters without added sugar or  oil. Dairy  Low-fat or nonfat dairy products, such as skim or 1% milk, 2% or reduced-fat cheeses, low-fat and fat-free ricotta or cottage cheese, or plain low-fat and nonfat yogurt. Fats and oils  Tub margarine without trans fats. Light or reduced-fat mayonnaise and salad dressings. Avocado. Olive, canola, sesame, or safflower oils. The items listed above may not be a complete list of foods and beverages you can eat. Contact a dietitian for more information. Foods to avoid Fruits  Canned fruit in heavy syrup. Fruit in cream or butter sauce. Fried fruit. Vegetables  Vegetables cooked in cheese, cream, or butter sauce. Fried vegetables. Grains  White bread. White pasta. White rice. Cornbread. Bagels, pastries, and croissants. Crackers and snack foods that contain trans fat and hydrogenated oils. Meats and other protein foods  Fatty cuts of meat. Ribs, chicken wings, bacon, sausage, bologna, salami, chitterlings, fatback, hot dogs, bratwurst, and packaged lunch meats. Liver and organ meats. Whole eggs and egg yolks. Chicken and [place] with skin. Fried meat. Dairy  Whole or 2% milk, cream, half-and-half, and cream cheese. Whole milk cheeses. Whole-fat or sweetened yogurt. Full-fat cheeses. Nondairy creamers and whipped toppings. Processed cheese, cheese spreads, and cheese curds. Beverages  Alcohol. Sugar-sweetened drinks such as sodas, lemonade, and fruit drinks. Fats and oils  Butter, stick margarine, lard, shortening, ghee, or bacon fat. Coconut, palm kernel, and palm oils. Sweets and desserts  Corn syrup, sugars, honey, and molasses. Candy. Jam and jelly. Syrup. Sweetened cereals. Cookies, pies, cakes, donuts, muffins, and ice cream. The items listed above may not be a complete list of foods and beverages you should avoid. Contact a dietitian for more information. Summary  Choosing the right foods helps keep your fat and cholesterol at normal levels. This can keep you from getting  certain diseases.  At meals, fill one-half of your plate with vegetables and green salads.  Eat high-fiber foods, like whole grains, beans, apples, carrots, peas, and barley.  Limit added sugar, saturated fats, alcohol, and fried foods. This information is not intended to replace advice given to you by your health care provider. Make sure you discuss any questions you have with your health care provider. Document Revised: 05/03/2018 Document Reviewed: 05/17/2017 Elsevier Patient Education  2020 Elsevier Inc.  Hydrochlorothiazide, HCTZ Oral Capsules or Tablets What is this medicine? HYDROCHLOROTHIAZIDE (hye droe klor oh THYE a zide)  is a diuretic. It helps you make more urine and to lose salt and excess water from your body. It treats swelling from heart, kidney, or liver disease. It also treats high blood pressure. This medicine may be used for other purposes; ask your health care provider or pharmacist if you have questions. COMMON BRAND NAME(S): Esidrix, Ezide, HydroDIURIL, Microzide, Oretic, Zide What should I tell my health care provider before I take this medicine? They need to know if you have any of these conditions:  diabetes  gout  immune system problems, like lupus  kidney disease or kidney stones  liver disease  pancreatitis  small amount of urine or difficulty passing urine  an unusual or allergic reaction to hydrochlorothiazide, sulfa drugs, other medicines, foods, dyes, or preservatives  pregnant or trying to get pregnant  breast-feeding How should I use this medicine? Take this drug by mouth. Take it as directed on the prescription label at the same time every day. You can take it with or without food. If it upsets your stomach, take it with food. Keep taking it unless your health care provider tells you to stop. Talk to your health care provider about the use of this drug in children. While it may be prescribed for children as young as newborns for selected  conditions, precautions do apply. Overdosage: If you think you have taken too much of this medicine contact a poison control center or emergency room at once. NOTE: This medicine is only for you. Do not share this medicine with others. What if I miss a dose? If you miss a dose, take it as soon as you can. If it is almost time for your next dose, take only that dose. Do not take double or extra doses. What may interact with this medicine?  cholestyramine  colestipol  digoxin  dofetilide  lithium  medicines for blood pressure  medicines for diabetes  medicines that relax muscles for surgery  other diuretics  steroid medicines like prednisone or cortisone This list may not describe all possible interactions. Give your health care provider a list of all the medicines, herbs, non-prescription drugs, or dietary supplements you use. Also tell them if you smoke, drink alcohol, or use illegal drugs. Some items may interact with your medicine. What should I watch for while using this medicine? Visit your doctor or health care professional for regular checks on your progress. Check your blood pressure as directed. Ask your doctor or health care professional what your blood pressure should be and when you should contact him or her. Talk to your health care professional about your risk of skin cancer. You may be more at risk for skin cancer if you take this medicine. This medicine can make you more sensitive to the sun. Keep out of the sun. If you cannot avoid being in the sun, wear protective clothing and use sunscreen. Do not use sun lamps or tanning beds/booths. You may need to be on a special diet while taking this medicine. Ask your doctor. Check with your doctor or health care professional if you get an attack of severe diarrhea, nausea and vomiting, or if you sweat a lot. The loss of too much body fluid can make it dangerous for you to take this medicine. You may get drowsy or dizzy. Do  not drive, use machinery, or do anything that needs mental alertness until you know how this medicine affects you. Do not stand or sit up quickly, especially if you are an older patient.  This reduces the risk of dizzy or fainting spells. Alcohol may interfere with the effect of this medicine. Avoid alcoholic drinks. This medicine may increase blood sugar. Ask your healthcare provider if changes in diet or medicines are needed if you have diabetes. What side effects may I notice from receiving this medicine? Side effects that you should report to your doctor or health care professional as soon as possible:  allergic reactions such as skin rash or itching, hives, swelling of the lips, mouth, tongue, or throat  changes in vision  chest pain  eye pain  fast or irregular heartbeat  feeling faint or lightheaded, falls  gout attack  muscle pain or cramps  pain or difficulty when passing urine  pain, tingling, numbness in the hands or feet  redness, blistering, peeling or loosening of the skin, including inside the mouth   signs and symptoms of high blood sugar such as being more thirsty or hungry or having to urinate more than normal. You may also feel very tired or have blurry vision.  unusually weak Side effects that usually do not require medical attention (report to your doctor or health care professional if they continue or are bothersome):  change in sex drive or performance  dry mouth  headache  stomach upset This list may not describe all possible side effects. Call your doctor for medical advice about side effects. You may report side effects to FDA at 1-800-FDA-1088. Where should I keep my medicine? Keep out of the reach of children and pets. Store at room temperature between 20 and 25 degrees C (68 and 77 degrees F). Protect from light and moisture. Keep the container tightly closed. Do not freeze. Throw away any unused drug after the expiration date. NOTE: This sheet  is a summary. It may not cover all possible information. If you have questions about this medicine, talk to your doctor, pharmacist, or health care provider.  2020 Elsevier/Gold Standard (2019-05-03 16:52:59)

## 2020-04-14 ENCOUNTER — Telehealth: Payer: Self-pay | Admitting: Cardiovascular Disease

## 2020-04-14 MED ORDER — LOSARTAN POTASSIUM 25 MG PO TABS: 25.0000 mg | ORAL_TABLET | Freq: Every day | ORAL | 4 refills | Status: DC

## 2020-04-14 NOTE — Telephone Encounter (Signed)
No answer. Left message to call back.   

## 2020-04-14 NOTE — Telephone Encounter (Signed)
Called and spoke to patient. Reports he started HCTZ 25 mg daily around 03/02/20.  A few days after starting it, he began having cramping in his hands and legs. His wife gave him over-the-counter potassium supplement to take and patient increased the amount of water intake. He also complains he was up to urinate a lot more than usual at night time. Reports he took the HCTZ in the morning and would urinate frequently throughout the day. No BP's to report as he is very busy with his jobs.  He would like something that would not make him cramp and urinate all night. Advised I will route to [facility] for further advice.

## 2020-04-14 NOTE — Telephone Encounter (Signed)
Pt c/o medication issue:  1. Name of Medication: hydrochlorothiazide   2. How are you currently taking this medication (dosage and times per day)? 25 MG 1 tablet daily  3. Are you having a reaction (difficulty breathing--STAT)? Cramps in hands and legs   4. What is your medication issue? Patient calling, states that he was prescribed this medication at last visit.  Patient states that diuretics seem to not work for him and would like to know if there are other options.  Please call to discuss.

## 2020-04-14 NOTE — Telephone Encounter (Signed)
Okay to stop HCTZ 25mg  daily.   Please start Losartan 25mg  daily.   , NP

## 2020-04-14 NOTE — Telephone Encounter (Signed)
Patient verbalized understanding to stop HCTZ and start losartan 25 mg daily. Rx sent to pharmacy and med list updated.

## 2020-04-30 ENCOUNTER — Ambulatory Visit: Payer: 59 | Admitting: Family

## 2020-05-05 ENCOUNTER — Telehealth: Payer: Self-pay | Admitting: Family Medicine

## 2020-05-05 NOTE — Telephone Encounter (Signed)
Patient requesting a call back from clinical staff to discuss "muscle relaxer" on his med list. Patient cannot remember name of medication but would like to discuss potentially refilling.

## 2020-05-06 ENCOUNTER — Other Ambulatory Visit: Payer: Self-pay | Admitting: Nurse Practitioner

## 2020-05-06 MED ORDER — CYCLOBENZAPRINE HCL 10 MG PO TABS: 10.0000 mg | ORAL_TABLET | Freq: Every day | ORAL | 1 refills | Status: DC

## 2020-05-06 NOTE — Telephone Encounter (Signed)
Called and spoke to patient. He states that he used to take cyclobenazeprine for his lower back and neck on occasions but does not have a current RX for it. Patient requesting a new prescription sent to [facility] on [address]. Cyclobenazeprine 10 mg listed in historical meds.

## 2020-05-06 NOTE — Telephone Encounter (Signed)
Refill sent in

## 2020-05-26 ENCOUNTER — Other Ambulatory Visit: Payer: 59

## 2020-05-26 ENCOUNTER — Other Ambulatory Visit: Payer: Self-pay

## 2020-05-26 DIAGNOSIS — Z20822 Contact with and (suspected) exposure to covid-19: Secondary | ICD-10-CM

## 2020-05-27 LAB — SARS-COV-2, NAA 2 DAY TAT

## 2020-05-27 LAB — NOVEL CORONAVIRUS, NAA: SARS-CoV-2, NAA: NOT DETECTED

## 2020-06-02 ENCOUNTER — Ambulatory Visit: Payer: 59 | Admitting: Family

## 2020-06-03 ENCOUNTER — Other Ambulatory Visit: Payer: 59

## 2020-06-03 ENCOUNTER — Other Ambulatory Visit: Payer: Self-pay

## 2020-06-03 DIAGNOSIS — Z20822 Contact with and (suspected) exposure to covid-19: Secondary | ICD-10-CM

## 2020-06-05 ENCOUNTER — Ambulatory Visit: Payer: Self-pay

## 2020-06-05 LAB — SARS-COV-2, NAA 2 DAY TAT

## 2020-06-05 LAB — NOVEL CORONAVIRUS, NAA: SARS-CoV-2, NAA: DETECTED — AB

## 2020-06-05 LAB — SPECIMEN STATUS REPORT

## 2020-06-05 NOTE — Telephone Encounter (Signed)
Called pt advised of Dr Henriette Combs message, he states that he will try to fight it off, advised if he changes his mind we can see about getting him scheduled. Pt verbalized understanding.

## 2020-06-05 NOTE — Telephone Encounter (Signed)
We can do a phone call, but he will need a visit if he needs abx.

## 2020-06-05 NOTE — Telephone Encounter (Signed)
Called pt to get him scheduled today pt declines virtual, he states that he just has a sore throat and didn't know if he needed an antibiotic or not. Please advise.

## 2020-06-05 NOTE — Telephone Encounter (Signed)
Pt. Started feeling bad Monday. Sore throat, fatigue, body aches, mild cough. COVID 19 positive. States worse symptom is sore throat. Trying home remedies without relief. Declines virtual visit. Wants to know what else he can do for it. Please advise.  Answer Assessment - Initial Assessment Questions 1. COVID-19 DIAGNOSIS: "Who made your Coronavirus (COVID-19) diagnosis?" "Was it confirmed by a positive lab test?" If not diagnosed by a HCP, ask "Are there lots of cases (community spread) where you live?" (See public health department website, if unsure)     Yes 2. COVID-19 EXPOSURE: "Was there any known exposure to COVID before the symptoms began?" CDC Definition of close contact: within 6 feet (2 meters) for a total of 15 minutes or more over a 24-hour period.      No 3. ONSET: "When did the COVID-19 symptoms start?"      Monday 4. WORST SYMPTOM: "What is your worst symptom?" (e.g., cough, fever, shortness of breath, muscle aches)     Sore throat 5. COUGH: "Do you have a cough?" If Yes, ask: "How bad is the cough?"       Mild 6. FEVER: "Do you have a fever?" If Yes, ask: "What is your temperature, how was it measured, and when did it start?"     Not now 7. RESPIRATORY STATUS: "Describe your breathing?" (e.g., shortness of breath, wheezing, unable to speak)      No 8. BETTER-SAME-WORSE: "Are you getting better, staying the same or getting worse compared to yesterday?"  If getting worse, ask, "In what way?"     Same 9. HIGH RISK DISEASE: "Do you have any chronic medical problems?" (e.g., asthma, heart or lung disease, weak immune system, obesity, etc.)     No 10. PREGNANCY: "Is there any chance you are pregnant?" "When was your last menstrual period?"       n/a 11. OTHER SYMPTOMS: "Do you have any other symptoms?"  (e.g., chills, fatigue, headache, loss of smell or taste, muscle pain, sore throat; new loss of smell or taste especially support the diagnosis of COVID-19)       Congestion,  fatigue  Protocols used: CORONAVIRUS (COVID-19) DIAGNOSED OR SUSPECTED-A-AH

## 2020-06-09 ENCOUNTER — Telehealth (INDEPENDENT_AMBULATORY_CARE_PROVIDER_SITE_OTHER): Payer: 59 | Admitting: Family Medicine

## 2020-06-09 ENCOUNTER — Encounter: Payer: Self-pay | Admitting: Family Medicine

## 2020-06-09 DIAGNOSIS — J029 Acute pharyngitis, unspecified: Secondary | ICD-10-CM | POA: Diagnosis not present

## 2020-06-09 DIAGNOSIS — U071 COVID-19: Secondary | ICD-10-CM | POA: Diagnosis not present

## 2020-06-09 MED ORDER — AMOXICILLIN-POT CLAVULANATE 875-125 MG PO TABS: 1.0000 | ORAL_TABLET | Freq: Two times a day (BID) | ORAL | 0 refills | Status: DC

## 2020-06-09 MED ORDER — LIDOCAINE VISCOUS HCL 2 % MT SOLN: 15.0000 mL | OROMUCOSAL | 0 refills | Status: DC | PRN

## 2020-06-09 MED ORDER — HYDROCOD POLST-CPM POLST ER 10-8 MG/5ML PO SUER: 5.0000 mL | Freq: Two times a day (BID) | ORAL | 0 refills | Status: DC | PRN

## 2020-06-09 NOTE — Progress Notes (Signed)
There were no vitals taken for this visit.   Subjective:    Patient ID: Jeff Fields, male    DOB: [birth year], 59 y.o.   MRN: [id number]  HPI: Jeff Fields is a 59 y.o. male  Chief Complaint  Patient presents with  . Sore Throat    x about a week. pt states had had some white stuff back in his throat. has tried OT medication.   UPPER RESPIRATORY TRACT INFECTION Duration: about a week Worst symptom: sore throat Fever: yes Cough: yes Shortness of breath: no Wheezing: no Chest pain: no Chest tightness: no Chest congestion: no Nasal congestion: no Runny nose: no Post nasal drip: no Sneezing: no Sore throat: yes Swollen glands: no Sinus pressure: no Headache: no Face pain: no Toothache: no Ear pain: no  Ear pressure: no  Eyes red/itching:no Eye drainage/crusting: no  Vomiting: no Rash: no Fatigue: yes Sick contacts: yes Strep contacts: no  Context: stable Recurrent sinusitis: no Relief with OTC cold/cough medications: no  Treatments attempted: cold/sinus, mucinex, anti-histamine and pseudoephedrine   Relevant past medical, surgical, family and social history reviewed and updated as indicated. Interim medical history since our last visit reviewed. Allergies and medications reviewed and updated.  Review of Systems  Constitutional: Positive for chills, fatigue and fever. Negative for activity change, appetite change, diaphoresis and unexpected weight change.  HENT: Positive for congestion and postnasal drip. Negative for dental problem, drooling, ear discharge, ear pain, facial swelling, hearing loss, mouth sores, nosebleeds, rhinorrhea, sinus pressure, sinus pain, sneezing, sore throat, tinnitus, trouble swallowing and voice change.   Respiratory: Negative.   Cardiovascular: Negative.   Gastrointestinal: Negative.   Psychiatric/Behavioral: Negative.     Per HPI unless specifically indicated above     Objective:    There were no vitals taken for this visit.    Wt Readings from Last 3 Encounters:  04/01/20 186 lb 8 oz (84.6 kg)  03/04/20 192 lb (87.1 kg)  04/30/19 196 lb (88.9 kg)    Physical Exam Vitals and nursing note reviewed.  Constitutional:      General: He is not in acute distress.    Appearance: Normal appearance. He is not ill-appearing, toxic-appearing or diaphoretic.  HENT:     Head: Normocephalic and atraumatic.     Right Ear: External ear normal.     Left Ear: External ear normal.     Nose: Nose normal.     Mouth/Throat:     Mouth: Mucous membranes are moist.     Pharynx: Oropharynx is clear.  Eyes:     General: No scleral icterus.       Right eye: No discharge.        Left eye: No discharge.     Conjunctiva/sclera: Conjunctivae normal.     Pupils: Pupils are equal, round, and reactive to light.  Pulmonary:     Effort: Pulmonary effort is normal. No respiratory distress.     Comments: Speaking in full sentences Musculoskeletal:        General: Normal range of motion.     Cervical back: Normal range of motion.  Skin:    Coloration: Skin is not jaundiced or pale.     Findings: No bruising, erythema, lesion or rash.  Neurological:     Mental Status: He is alert and oriented to person, place, and time. Mental status is at baseline.  Psychiatric:        Mood and Affect: Mood normal.  Behavior: Behavior normal.        Thought Content: Thought content normal.        Judgment: Judgment normal.     Results for orders placed or performed in visit on 06/03/20  Novel Coronavirus, NAA ([facility])   Specimen: Nasopharyngeal(NP) swabs in vial transport medium  Result Value Ref Range   SARS-CoV-2, NAA Detected (A) Not Detected  SARS-COV-2, NAA 2 DAY TAT  Result Value Ref Range   SARS-CoV-2, NAA 2 DAY TAT Performed   Specimen status report  Result Value Ref Range   specimen status report Comment       Assessment & Plan:   Problem List Items Addressed This Visit    None    Visit Diagnoses    COVID-19    -   Primary   Will treat empircally for strep and start lidocaine. Tussionex for comfort. Call with any concerns.    Sore throat       Will treat empircally for strep and start lidocaine. Tussionex for comfort. Call with any concerns.        Follow up plan: Return if symptoms worsen or fail to improve.   . This visit was completed via MyChart due to the restrictions of the COVID-19 pandemic. All issues as above were discussed and addressed. Physical exam was done as above through visual confirmation on MyChart. If it was felt that the patient should be evaluated in the office, they were directed there. The patient verbally consented to this visit. . Location of the patient: home . Location of the provider: work . Those involved with this call:  . Provider: Olevia Perches, DO . CMA: Elton Sin, CMA . Front Desk/Registration: Adela Ports  . Time spent on call: 15 minutes with patient face to face via video conference. More than 50% of this time was spent in counseling and coordination of care. 23 minutes total spent in review of patient's record and preparation of their chart.

## 2020-06-11 ENCOUNTER — Telehealth: Payer: Self-pay | Admitting: Family Medicine

## 2020-06-11 NOTE — Telephone Encounter (Signed)
Can the letter be sent that the patient is requesting? Covid positive on 06/03/20

## 2020-06-11 NOTE — Telephone Encounter (Signed)
Please advise pt had virtual 9/27  Copied from CRM #[id number]. Topic: General - Other >> Jun 11, 2020  9:20 AM Marylen Ponto wrote: Reason for CRM: Pt stated he is getting a little better but he still has the sores in his throat and his head feels stuffy. Pt stated he will need a note for work to advise his absence is due to Covid and the return date of Oct 11.

## 2020-06-11 NOTE — Telephone Encounter (Signed)
He's 2 days into his antibiotic. Give it a bit more time to work. But I'm glad he's starting to feel better.

## 2020-06-12 ENCOUNTER — Encounter: Payer: Self-pay | Admitting: Family Medicine

## 2020-06-12 NOTE — Telephone Encounter (Signed)
Patient notified. States he got letter on his mychart.

## 2020-06-12 NOTE — Telephone Encounter (Signed)
Note written and in in his chart

## 2020-06-13 ENCOUNTER — Other Ambulatory Visit: Payer: Self-pay | Admitting: Family Medicine

## 2020-06-13 NOTE — Telephone Encounter (Signed)
Routing to provider  

## 2020-06-13 NOTE — Telephone Encounter (Signed)
Pt would like a refill of chlorpheniramine-HYDROcodone (TUSSIONEX PENNKINETIC ER) 10-8 MG/5ML SUER  Pt states the coughing gets really bad at night and this helps him sleep.  [facility] [place], [place] - [address] Phone:  [phone]  Fax:  [phone]

## 2020-06-26 ENCOUNTER — Other Ambulatory Visit: Payer: Self-pay | Admitting: Cardiovascular Disease

## 2020-06-26 DIAGNOSIS — I25118 Atherosclerotic heart disease of native coronary artery with other forms of angina pectoris: Secondary | ICD-10-CM

## 2020-06-26 DIAGNOSIS — E782 Mixed hyperlipidemia: Secondary | ICD-10-CM

## 2020-07-04 ENCOUNTER — Telehealth: Payer: Self-pay | Admitting: Cardiovascular Disease

## 2020-07-04 DIAGNOSIS — E782 Mixed hyperlipidemia: Secondary | ICD-10-CM

## 2020-07-04 DIAGNOSIS — I25118 Atherosclerotic heart disease of native coronary artery with other forms of angina pectoris: Secondary | ICD-10-CM

## 2020-07-04 MED ORDER — ROSUVASTATIN CALCIUM 5 MG PO TABS: 5.0000 mg | ORAL_TABLET | Freq: Every day | ORAL | 0 refills | Status: DC

## 2020-07-04 NOTE — Telephone Encounter (Signed)
*  STAT* If patient is at the pharmacy, call can be transferred to refill team.   1. Which medications need to be refilled? (please list name of each medication and dose if known)   Rosuvastatin 5 mg po q d   2. Which pharmacy/location (including street and city if local pharmacy) is medication to be sent to?  [facility]   3. Do they need a 30 day or 90 day supply? 90

## 2020-08-18 ENCOUNTER — Other Ambulatory Visit: Payer: Self-pay

## 2020-08-18 DIAGNOSIS — M5440 Lumbago with sciatica, unspecified side: Secondary | ICD-10-CM

## 2020-08-18 MED ORDER — MELOXICAM 15 MG PO TABS: 15.0000 mg | ORAL_TABLET | Freq: Every day | ORAL | 1 refills | Status: DC

## 2020-09-07 ENCOUNTER — Other Ambulatory Visit: Payer: Self-pay | Admitting: Family

## 2020-09-08 NOTE — Telephone Encounter (Signed)
Pt overdue for 2 month f/u. Please contact pt for future appointment. 

## 2020-09-08 NOTE — Telephone Encounter (Signed)
LVM for patient to call back. ?

## 2020-09-16 NOTE — Telephone Encounter (Signed)
Attempted to schedule.  LMOV to call office.  ° °

## 2020-09-26 ENCOUNTER — Other Ambulatory Visit
Admission: RE | Admit: 2020-09-26 | Discharge: 2020-09-26 | Disposition: A | Discharge status: Home / Self Care | Payer: 59 | Source: Ambulatory Visit | Attending: Physician Assistant | Admitting: Physician Assistant

## 2020-09-26 ENCOUNTER — Other Ambulatory Visit: Payer: Self-pay

## 2020-09-26 ENCOUNTER — Encounter: Payer: Self-pay | Admitting: Physician Assistant

## 2020-09-26 ENCOUNTER — Ambulatory Visit: Payer: 59 | Admitting: Physician Assistant

## 2020-09-26 VITALS — BP 186/104 | HR 51 | Ht 69.0 in | Wt 188.0 lb

## 2020-09-26 DIAGNOSIS — I2510 Atherosclerotic heart disease of native coronary artery without angina pectoris: Secondary | ICD-10-CM | POA: Insufficient documentation

## 2020-09-26 DIAGNOSIS — I25118 Atherosclerotic heart disease of native coronary artery with other forms of angina pectoris: Secondary | ICD-10-CM | POA: Insufficient documentation

## 2020-09-26 DIAGNOSIS — I998 Other disorder of circulatory system: Secondary | ICD-10-CM

## 2020-09-26 DIAGNOSIS — Z87891 Personal history of nicotine dependence: Secondary | ICD-10-CM | POA: Diagnosis not present

## 2020-09-26 DIAGNOSIS — E782 Mixed hyperlipidemia: Secondary | ICD-10-CM | POA: Diagnosis not present

## 2020-09-26 DIAGNOSIS — I10 Essential (primary) hypertension: Secondary | ICD-10-CM

## 2020-09-26 LAB — BASIC METABOLIC PANEL
Anion gap: 7 (ref 5–15)
BUN: 16 mg/dL (ref 6–20)
CO2: 28 mmol/L (ref 22–32)
Calcium: 9.5 mg/dL (ref 8.9–10.3)
Chloride: 105 mmol/L (ref 98–111)
Creatinine, Ser: 0.98 mg/dL (ref 0.61–1.24)
GFR, Estimated: >60 mL/min (ref >60)
Glucose, Bld: 106 mg/dL — ABNORMAL HIGH (ref 70–99)
Potassium: 4.5 mmol/L (ref 3.5–5.1)
Sodium: 140 mmol/L (ref 135–145)

## 2020-09-26 MED ORDER — LOSARTAN POTASSIUM 50 MG PO TABS: 50.0000 mg | ORAL_TABLET | Freq: Every day | ORAL | 3 refills | Status: DC

## 2020-09-26 MED ORDER — CLONIDINE HCL 0.1 MG PO TABS
0.1000 mg | ORAL_TABLET | Freq: Once | ORAL | Status: AC
  Administered 2020-09-26: 0.1 mg via ORAL

## 2020-09-26 NOTE — Progress Notes (Signed)
Office Visit    Patient Name: Jeff Fields Date of Encounter: 09/26/2020  Primary Care Provider:  Dorcas Carrow, DO Primary Cardiologist:  Julien Nordmann, MD  Chief Complaint    Chief Complaint  Patient presents with  . Follow-up    Medications verbally reviewed with patient.     60 year old male with history of CAD, hypertension, hyperlipidemia, COVID 19 infection, prior history of tabacco use approximately 30+ years ago, and here for follow-up of BP medications with uncontrolled BP at presentation and systolic BP into the 200s.  Past Medical History    Past Medical History:  Diagnosis Date  . Coronary artery disease    a. LHC 10/2014: mid LAD 99% stenosis s/p PCI/DES, 40% stenosis in the proximal LAD, D1 40% stenosis at the ostium of the vessel, EF > 55%  . Hyperlipidemia   . Hypertension    Past Surgical History:  Procedure Laterality Date  . CARDIAC CATHETERIZATION  11/08/2014  . CORONARY ANGIOPLASTY WITH STENT PLACEMENT  11/08/2014   drug eluting stent placement to the mid LAD  . VASECTOMY  2000    Allergies  No Known Allergies  History of Present Illness    Jeff Fields is a 60 y.o. male with PMH as above.   He underwent cardiac catheterization with stent placement to mid LAD 10/2014.  He has previously been intolerant to atorvastatin 40 mg daily and tolerated Crestor 5 mg daily without difficulty.  He was last seen in clinic 04/01/2020.  At that time, he reported feeling well overall.  He was eating at home with wife completing most of the cooking.  He had lost 10 pounds over the last year.  He was started on HCTZ 25 mg daily for elevated BP.  On review of phone notes, this was subsequently discontinued due to cramping and patient started on losartan 25 mg daily.  Today, 09/26/2020, he returns to clinic and notes that he is overall doing well from a cardiac standpoint.  BP noted to be significantly elevated at start of visit with initial BP 186/104.  Clonidine 0.1  mg administered at the start of his visit.  He denied any chest pain at start of visit and throughout the visit.  No shortness of breath at rest or with exertion.  Wt noted to be up from previous clinic wt, attributed to caloric intake. He reports dietary indiscretion, eating a significant amount of salt and "not recommended foods" over the holidays.  He denies wt due to volume and reports wt due to caloric intake. He also reports eating chesses and Asian food, which we reviewed can have a lot of Na.  In addition, he reportsdrinking a lot of propel, which he did not realize had salt in it.  He reports a significant amount of stress same day- no further details provided- but feels this is contributing to his BP. 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 He notes trouble and stressors today. He is noted to be flushed today with pt denying any CP, SOB, racing HR, or diaphoresis. He denies any additional medications today. No significant caffeine. He does report 2 propel drinks before his visit but otherwise no significant input of note reported. He reports a headache but otherwise asx.  No presyncope or syncope.  No racing heart rate or palpitations.  He noted amaurosis fugax, though stated that this is not an atypical finding for him and  ongoing for several years.  He reports home BP 120s/80s. He reports swelling a while ago but is unable to estimate the time frame. He initially declines labs but with ectopic rhythm on repeat EKG, agreeable for labs.  He requests a work note. He confirms intolerance to HCTZ due to cramping. He is aware that Mobic is not recommended with elevated BP. He reports this is prescribed for arthritis.   Home Medications    Current Outpatient Medications on File Prior to Visit  Medication Sig Dispense Refill  . aspirin 81 MG tablet Take 81 mg by mouth daily.    . carvedilol (COREG) 25 MG tablet Take 1 tablet  (25 mg total) by mouth 2 (two) times daily. 180 tablet 3  . Coenzyme Q10 (COQ-10) 200 MG CAPS Take 200 mg by mouth daily.    . hydrochlorothiazide (HYDRODIURIL) 25 MG tablet hydrochlorothiazide 25 mg tablet    . losartan (COZAAR) 25 MG tablet Take 1 tablet (25 mg total) by mouth daily. MUST CONTACT OFFICE FOR APPOINTMENT. NO FURTHER REFILLS UNTIL SEEN IN CLINIC 15 tablet 0  . meloxicam (MOBIC) 15 MG tablet Take 1 tablet (15 mg total) by mouth daily. 90 tablet 1  . rosuvastatin (CRESTOR) 5 MG tablet Take 1 tablet (5 mg total) by mouth daily. Patient needs OV for future refills 90 tablet 0   No current facility-administered medications on file prior to visit.    Review of Systems    He denies chest pain, palpitations, dyspnea, pnd, orthopnea, n, v, dizziness, syncope, edema, weight gain, or early satiety. He reports a headache.   All other systems reviewed and are otherwise negative except as noted above.  Physical Exam    VS:  BP (!) 186/104 (BP Location: Left Arm, Patient Position: Sitting, Cuff Size: Normal)   Pulse (!) 51   Ht 5\' 9"  (1.753 m)   Wt 188 lb (85.3 kg)   SpO2 99%   BMI 27.76 kg/m  , BMI Body mass index is 27.76 kg/m. GEN: Flushed. well nourished, well developed, in no acute distress. HEENT: normal. Neck: Supple, no JVD, carotid bruits, or masses. Cardiac: bradycardic but regular, no murmurs, rubs, or gallops. No clubbing, cyanosis, edema.  Radials/DP/PT 2+ and equal bilaterally.  Respiratory:  Respirations regular and unlabored, clear to auscultation bilaterally. GI: Soft, nontender, nondistended, BS + x 4. MS: no deformity or atrophy. Skin: flushed appearing. warm and dry, no rash. Neuro:  Strength and sensation are intact. Psych: Normal affect.  Accessory Clinical Findings    ECG personally reviewed by me today - sinus bradycardia with sinus arrhythmia, 51 bpm, QRS 90 ms, possible left atrial enlargement, nonspecific ST and T changes noted, compared with previous  EKG from 09/28/2017. Repeat EKG after persistent and increasing BP despite antihypertensives in clinic shows ectopic rhythm with ongoing nonspecific abnormality- no acute changes.  VITALS Reviewed today   Temp Readings from Last 3 Encounters:  03/04/20 98.1 F (36.7 C) (Oral)  06/02/18 98.7 F (37.1 C) (Oral)  07/26/17 98.1 F (36.7 C) (Oral)   BP Readings from Last 3 Encounters:  09/26/20 (!) 186/104  04/01/20 140/80  03/04/20 (!) 174/96   Pulse Readings from Last 3 Encounters:  09/26/20 (!) 51  04/01/20 60  03/04/20 (!) 53    Wt Readings from Last 3 Encounters:  09/26/20 188 lb (85.3 kg)  04/01/20 186 lb 8 oz (84.6 kg)  03/04/20 192 lb (87.1 kg)     LABS  reviewed today   Lab Results  Component Value Date   WBC 8.4 03/04/2020   HGB 14.8 03/04/2020   HCT 41.4 03/04/2020   MCV 91 03/04/2020   PLT 230 03/04/2020   Lab Results  Component Value Date   CREATININE 1.04 03/04/2020   BUN 19 03/04/2020   NA 138 03/04/2020   K 4.5 03/04/2020   CL 101 03/04/2020   CO2 25 03/04/2020   Lab Results  Component Value Date   ALT 21 03/04/2020   AST 26 03/04/2020   ALKPHOS 73 03/04/2020   BILITOT 1.1 03/04/2020   Lab Results  Component Value Date   CHOL 151 03/04/2020   HDL 48 03/04/2020   LDLCALC 82 03/04/2020   TRIG 120 03/04/2020    No results found for: HGBA1C Lab Results  Component Value Date   TSH 2.290 05/10/2017     STUDIES/PROCEDURES reviewed today   NM Study 10/07/17  Blood pressure demonstrated a hypertensive response to exercise.  Upsloping ST segment depression ST segment depression of 1 mm was noted during stress in the II, III and aVF leads.  The study is normal.  This is a low risk study.  The left ventricular ejection fraction is normal (55-65%).   Cath 11/08/2014  Assessment & Plan    Poorly controlled blood pressure -- BP sub-optimal at 186/104 and rising with subsequent recheck to well over SBP 200s and DBP into the 110s, despite  antihypertensives provided in clinic.  No OTC rx. Reports elevated 2/2 recent work stressors and propel before visit. He did not tolerate HCTZ. Mobic not recommended given influence on BP with pt understanding. Fluid under 2L and salt restriction under 2g discussed as well. Clonidine 0.1 mg x2 administered at the start of clinic with 20 minutes rest and subsequent BP elevated greater than that of initial with SBPs 200s/DBPs 100-110s, as well as elevated rates. Recent work screen with vitals /labs not available on EMR. Repeat EKG requested after repeat ausculatation, showing ectopic rhythm. Labs recommended but declined. Work note requested for today and tomorrow. Agreement with pt that, with work note, will obtain BMET given need for labs..Hydralazine 25mg  provided for hopeful additional BP support.  Increased to losartan to 50 mg daily.  Repeat BMET 1 week. Possible RAS rule out workup in near future. Further recommendations pending labs.   Ectopic atrial rhythm --New by repeat EKG due to persistent elevated BP despite in clinic antihypertensives (clonidine, hydralazine). No OTC rx. Discussed in office same day. Obtain BMET as above to reassess electrolytes and renal function.  Reassess at RTC.  Consider subsequent BP monitor based on repeat EKG at that time.  He denies any racing heart rate, palpitations, presyncope, or syncope.  BP control strongly recommended.  CAD -- Denies any chest pain. Previous workup as above. EKG with nonspecific ST changes and LVH.  Repeat EKG with ectopic rhythm. Previous 2019 stress test showed hypertensive response to exercise. Will obtain labs. Close office follow-up. Continue ASA, BB, statin.  HLD, goal LDL <70 --Continue statin. Dietary changes discussed.   Medication changes: Clonidine 0.1 mg x 2, hydralazine 25 mg provided in clinic.  Increase to losartan 50 mg daily.  Tentative subsequent medication changes pending labs. Discontinue Mobic.  Labs ordered:  BMET Studies / Imaging ordered: Repeat EKG performed in clinic with ectopic rhythm noted Future considerations: Renal artery ultrasound, Escalation of antihypertensive therapy as indicated Disposition: RTC 1 week with pt preference for 3 mo. Work note provided     2020, PA-C 09/26/2020

## 2020-09-26 NOTE — Patient Instructions (Signed)
Medication Instructions:   INCREASE Losartan 50mg  daily - A new Rx has been sent to your pharmacy -  You may take an extra dose of Losartan 25mg  today when you get home  *If you need a refill on your cardiac medications before your next appointment, please call your pharmacy*   Lab Work:  1)  Your provider recommends that you have lab work TODAY at the medical mall: [facility] -  Please go to the . You will check in at the front desk to the right as you walk into the atrium. Valet Parking is offered if needed. - No appointment needed. You may go any day between 7 am and 6 pm.  2)  Your provider also recommends that you have a REPEAT [facility] in ONE WEEK at the medical mall -  Please go to the [facility]. You will check in at the front desk to the right as you walk into the atrium. Valet Parking is offered if needed. - No appointment needed. You may go any day between 7 am and 6 pm.   If you have labs (blood work) drawn today and your tests are completely normal, you will receive your results only by: [employer] MyChart Message (if you have MyChart) OR . A paper copy in the mail If you have any lab test that is abnormal or we need to change your treatment, we will call you to review the results.   Testing/Procedures: None ordered   Follow-Up: At [facility], you and your health needs are our priority.  As part of our continuing mission to provide you with exceptional heart care, we have created designated Provider Care Teams.  These Care Teams include your primary Cardiologist (physician) and Advanced Practice Providers (APPs -  Physician Assistants and Nurse Practitioners) who all work together to provide you with the care you need, when you need it.  We recommend signing up for the patient portal called "MyChart".  Sign up information is provided on this After Visit Summary.  MyChart is used to connect with patients for Virtual Visits (Telemedicine).  Patients are able to  view lab/test results, encounter notes, upcoming appointments, etc.  Non-urgent messages can be sent to your provider as well.   To learn more about what you can do with MyChart, go to Marland Kitchen.    Your next appointment:   3 month(s)  The format for your next appointment:   In Person  Provider:   You may see [facility], MD or one of the following Advanced Practice Providers on your designated Care Team:    [email or web address], NP  Julien Nordmann, PA-C  Nicolasa Ducking, PA-C  Cadence Eula Listen, Marisue Ivan  Fransico Michael, NP    Other Instructions  Monitor BP at home and call the office if your BP is consistently over 130/80.

## 2020-09-27 MED ORDER — HYDRALAZINE HCL 10 MG PO TABS: 25.0000 mg | ORAL_TABLET | Freq: Once | ORAL | Status: DC

## 2020-09-28 ENCOUNTER — Other Ambulatory Visit: Payer: Self-pay | Admitting: Cardiovascular Disease

## 2020-09-28 DIAGNOSIS — I25118 Atherosclerotic heart disease of native coronary artery with other forms of angina pectoris: Secondary | ICD-10-CM

## 2020-09-28 DIAGNOSIS — E782 Mixed hyperlipidemia: Secondary | ICD-10-CM

## 2020-09-29 ENCOUNTER — Telehealth: Payer: Self-pay | Admitting: Cardiovascular Disease

## 2020-09-29 ENCOUNTER — Other Ambulatory Visit: Payer: Self-pay | Admitting: Physician Assistant

## 2020-09-29 DIAGNOSIS — Z79899 Other long term (current) drug therapy: Secondary | ICD-10-CM

## 2020-09-29 DIAGNOSIS — E782 Mixed hyperlipidemia: Secondary | ICD-10-CM

## 2020-09-29 DIAGNOSIS — I10 Essential (primary) hypertension: Secondary | ICD-10-CM

## 2020-09-29 MED ORDER — OLMESARTAN MEDOXOMIL 40 MG PO TABS: 40.0000 mg | ORAL_TABLET | Freq: Every day | ORAL | 1 refills | Status: DC

## 2020-09-29 NOTE — Telephone Encounter (Signed)
Noted.  Yes, please proceed with recommendations as documented in result note. --Stop Losartan 50mg  daily. --Start Olmesartan 40mg  daily (controls BP better than Losartan). --Repeat BMET 1-2 weeks. --Monitor / log daily BP, HR, wt. --Limit fluid, salt. --Virtual or clinic visit this week or next week to be certain he is not holding on to any fluid and reassess sx - we can discuss a plan based on his s/sx and BP/HR/wt log at that time.

## 2020-09-29 NOTE — Telephone Encounter (Signed)
Pt notified of lab results and recc below. Pt verbalized understanding.  Pt will STOP Losartan and START Olmesartan 40mg  daily. Rx sent to [facility] [address] per pt request. Pt will continue to log BP, HR, and weights daily. Limit fluids/salt.  Advised pt to have either virtual or clinic visit scheduled either this week or next week and pt refused to schedule at this time. States he will call our office back to schedule.   Pt states he will have repeat labs in 1-2 weeks and would like to pick up lab order to have these done at [facility].  Notified pt when our office is open he may pick up at front desk. Lab orders are placed, these will just need to be printed for pt.

## 2020-09-29 NOTE — Telephone Encounter (Signed)
Spoke to pt. He is now on day 3 of incr dose Losartan 50mg  daily.  Reports only one BP reading of 137/83, otherwise every other time is still elevated. He checks BP twice daily and systolic "runs around 170s" and diastolic "90s-100".   After speaking with pt, I see in results note since renal fxn stable, if BP still elevated, recommendation to discontinue losartan, so that we can change to olmesartan 40mg .  Clarifying that you would like me to go ahead and proceed with this today for pt? And will also have him have [facility] in 1-2 weeks?

## 2020-09-29 NOTE — Telephone Encounter (Signed)
Called patient to schedule follow up Patient states that the medication Leafy Kindle prescribed Friday 01/14 is not helping at all Wants to discuss further options Please call to discuss

## 2020-10-02 ENCOUNTER — Telehealth: Payer: Self-pay

## 2020-10-02 NOTE — Telephone Encounter (Signed)
LVM to schedule

## 2020-10-02 NOTE — Patient Instructions (Signed)
How to Take Your Blood Pressure Blood pressure measures how strongly your blood is pressing against the walls of your arteries. Arteries are blood vessels that carry blood from your heart throughout your body. You can take your blood pressure at home with a machine. You may need to check your blood pressure at home:  To check if you have high blood pressure (hypertension).  To check your blood pressure over time.  To make sure your blood pressure medicine is working. Supplies needed:  Blood pressure machine, or monitor.  Dining room chair to sit in.  Table or desk.  Small notebook.  Pencil or pen. How to prepare Avoid these things for 30 minutes before checking your blood pressure:  Having drinks with caffeine in them, such as coffee or tea.  Drinking alcohol.  Eating.  Smoking.  Exercising. Do these things five minutes before checking your blood pressure:  Go to the bathroom and pee (urinate).  Sit in a dining chair. Do not sit in a soft couch or an armchair.  Be quiet. Do not talk. How to take your blood pressure Follow the instructions that came with your machine. If you have a digital blood pressure monitor, these may be the instructions: 1. Sit up straight. 2. Place your feet on the floor. Do not cross your ankles or legs. 3. Rest your left arm at the level of your heart. You may rest it on a table, desk, or chair. 4. Pull up your shirt sleeve. 5. Wrap the blood pressure cuff around the upper part of your left arm. The cuff should be 1 inch (2.5 cm) above your elbow. It is best to wrap the cuff around bare skin. 6. Fit the cuff snugly around your arm. You should be able to place only one finger between the cuff and your arm. 7. Place the cord so that it rests in the bend of your elbow. 8. Press the power button. 9. Sit quietly while the cuff fills with air and loses air. 10. Write down the numbers on the screen. 11. Wait 2-3 minutes and then repeat steps 1-10.    What do the numbers mean? Two numbers make up your blood pressure. The first number is called systolic pressure. The second is called diastolic pressure. An example of a blood pressure reading is "120 over 80" (or 120/80). If you are an adult and do not have a medical condition, use this guide to find out if your blood pressure is normal: Normal  First number: below 120.  Second number: below 80. Elevated  First number: 120-129.  Second number: below 80. Hypertension stage 1  First number: 130-139.  Second number: 80-89. Hypertension stage 2  First number: 140 or above.  Second number: 90 or above. Your blood pressure is above normal even if only the top or bottom number is above normal. Follow these instructions at home:  Check your blood pressure as often as your doctor tells you to.  Check your blood pressure at the same time every day.  Take your monitor to your next doctor's appointment. Your doctor will: ? Make sure you are using it correctly. ? Make sure it is working right.  Make sure you understand what your blood pressure numbers should be.  Tell your doctor if your medicine is causing side effects.  Keep all follow-up visits as told by your doctor. This is important. General tips:  You will need a blood pressure machine, or monitor. Your doctor can suggest a   monitor. You can buy one at a drugstore or online. When choosing one: ? Choose one with an arm cuff. ? Choose one that wraps around your upper arm. Only one finger should fit between your arm and the cuff. ? Do not choose one that measures your blood pressure from your wrist or finger. Where to find more information American Heart Association: www.heart.org Contact a doctor if:  Your blood pressure keeps being high. Get help right away if:  Your first blood pressure number is higher than 180.  Your second blood pressure number is higher than 120. Summary  Check your blood pressure at the same  time every day.  Avoid caffeine, alcohol, smoking, and exercise for 30 minutes before checking your blood pressure.  Make sure you understand what your blood pressure numbers should be. This information is not intended to replace advice given to you by your health care provider. Make sure you discuss any questions you have with your health care provider. Document Revised: 08/24/2019 Document Reviewed: 08/24/2019 Elsevier Patient Education  2021 Elsevier Inc.  

## 2020-10-02 NOTE — Telephone Encounter (Signed)
Spoke with patient and reviewed his concerns with blood pressures. He reports that his readings consistently remain in the 180's over 90's range since medication changes. Inquired when he is checking these if before or after medications. He states most of these are before medications. Inquired if he had any readings after medications and states one reading he checked one hour after medications was 137/85. Reviewed some tips about blood pressure monitoring and let him know that I would send these through My Chart. Reviewed signs and symptoms that would require immediate evaluation in the emergency room. Also requested that he keep a daily log of readings, bring in his medications, blood pressure cuff, and list of those readings. He verbalized understanding of our conversation, agreement with plan, and had no further questions. Advised that I would send to provider for review and would call back if any further recommendations before upcoming appointment.

## 2020-10-02 NOTE — Telephone Encounter (Signed)
Yes, please make sure that he is checking his blood pressure at least 2 hours after he takes his medications.  He should see the blood pressure medication continue to help his pressure over the next 2 weeks.

## 2020-10-02 NOTE — Telephone Encounter (Signed)
Patient states that the medication Michaelle Birks gave for BP is not helping.   Patient states that BP is consistently running 180s / upper 90s.   Patient states he is at work so if he does not answer he will call back.

## 2020-10-06 NOTE — Progress Notes (Unsigned)
Patient ID: Jeff Fields, male   DOB: Oct 11, 1960, 60 y.o.   MRN: [id number] Cardiology Office Note  Date:  10/07/2020   ID:  Jeff Fields, DOB 05-Dec-1960, MRN [id number]  PCP:  Jeff Carrow, DO   Chief Complaint  Patient presents with  . OTHER    Patient c/o elevated blood pressures. Medications verbally reviewed with patient.     HPI:  [name] is a 60 year old gentleman with History of remote smoking history,  initially presenting with chest pain on exertion,  stress test that showed anterior wall ischemia, cardiac catheterization and stent placement to his mid LAD 11/08/14.  He presents today for follow-up of his coronary artery disease  Blood pressure has been running higher Requesting medication changes, frequent 140 up to 150 systolic  No recent labs Has labs done through work LDL 60, and most recent LDL was higher, 80  No regular exercise program  Denies significant chest pain concerning for angina Active at work  Tolerating low-dose Crestor daily 5 mg Previously with issues with higher dose statins  Previous lab work reviewed LDL 82  other past medical history Previous stress test showed anterior wall ischemia seen in both non-attenuation corrected and attenuation corrected images. Wall motion abnormality to the anterior and anteroseptal wall  Father diagnosed with testicular cancer requiring treatment. Patient found a lump on his testicle and after significant workup, this was found to be a benign finding  PMH:   has a past medical history of Coronary artery disease, Hyperlipidemia, and Hypertension. Also history of coronary artery disease, prior stent  PSH:    Past Surgical History:  Procedure Laterality Date  . CARDIAC CATHETERIZATION  11/08/2014  . CORONARY ANGIOPLASTY WITH STENT PLACEMENT  11/08/2014   drug eluting stent placement to the mid LAD  . VASECTOMY  2000    Current Outpatient Medications  Medication Sig Dispense Refill  . aspirin 81 MG  tablet Take 81 mg by mouth daily.    . carvedilol (COREG) 25 MG tablet Take 1 tablet (25 mg total) by mouth 2 (two) times daily. 180 tablet 3  . Coenzyme Q10 (COQ-10) 200 MG CAPS Take 200 mg by mouth daily.    Marland Kitchen olmesartan (BENICAR) 40 MG tablet Take 1 tablet (40 mg total) by mouth daily. 30 tablet 1  . rosuvastatin (CRESTOR) 5 MG tablet TAKE 1 TABLET DAILY 90 tablet 0   Current Facility-Administered Medications  Medication Dose Route Frequency Provider Last Rate Last Admin  . hydrALAZINE (APRESOLINE) tablet 25 mg  25 mg Oral Once Marisue Ivan D, PA-C         Allergies:   Patient has no known allergies.   Social History:  The patient  reports that he quit smoking about 32 years ago. His smoking use included cigarettes. He smoked 1.00 pack per day. He has never used smokeless tobacco. He reports current alcohol use. He reports that he does not use drugs.   Family History:   family history includes Arrhythmia in his father; Arthritis in his brother; Hypertension in his brother, father, and mother; Lung cancer in his maternal grandfather; Stroke in his maternal grandmother.    Review of Systems: Review of Systems  Constitutional: Negative.   HENT: Negative.   Respiratory: Negative.   Cardiovascular: Negative.   Gastrointestinal: Negative.   Musculoskeletal: Negative.   Skin: Negative.   Neurological: Negative.   Psychiatric/Behavioral: Negative.   All other systems reviewed and are negative.   PHYSICAL EXAM: VS:  BP Marland Kitchen)  158/106 (BP Location: Left Arm, Patient Position: Sitting, Cuff Size: Normal)   Pulse 68   Ht 5\' 9"  (1.753 m)   Wt 186 lb (84.4 kg)   SpO2 98%   BMI 27.47 kg/m  , BMI Body mass index is 27.47 kg/m.  Constitutional:  oriented to person, place, and time. No distress.  HENT:  Head: Grossly normal Eyes:  no discharge. No scleral icterus.  Neck: No JVD, no carotid bruits  Cardiovascular: Regular rate and rhythm, no murmurs appreciated Pulmonary/Chest:  Clear to auscultation bilaterally, no wheezes or rails Abdominal: Soft.  no distension.  no tenderness.  Musculoskeletal: Normal range of motion Neurological:  normal muscle tone. Coordination normal. No atrophy Skin: Skin warm and dry Psychiatric: normal affect, pleasant   Recent Labs: 03/04/2020: ALT 21; Hemoglobin 14.8; Platelets 230 09/26/2020: BUN 16; Creatinine, Ser 0.98; Potassium 4.5; Sodium 140    Lipid Panel Lab Results  Component Value Date   CHOL 151 03/04/2020   HDL 48 03/04/2020   LDLCALC 82 03/04/2020   TRIG 120 03/04/2020      Wt Readings from Last 3 Encounters:  10/07/20 186 lb (84.4 kg)  09/26/20 188 lb (85.3 kg)  04/01/20 186 lb 8 oz (84.6 kg)     ASSESSMENT AND PLAN:  Coronary artery disease involving native coronary artery of native heart without angina pectoris Currently with no symptoms of angina. No further workup at this time. Continue current medication regimen.\  Hyperlipidemia Recommend he stay on Crestor 5 daily Add Zetia 10 mg daily goal LDL less than 60  Essential hypertension Blood pressure running high at home Recommend we add amlodipine 5 mg daily titrating up to 10 mg daily Stay on olmesartan, carvedilol  S/P coronary artery stent placement Denies anginal symptoms   Total encounter time more than 25 minutes  Greater than 50% was spent in counseling and coordination of care with the patient   No orders of the defined types were placed in this encounter.    Signed, 04/03/20, M.D., Ph.D. 10/07/2020  [facility] [place], [place]

## 2020-10-07 ENCOUNTER — Encounter: Payer: Self-pay | Admitting: Cardiovascular Disease

## 2020-10-07 ENCOUNTER — Ambulatory Visit: Payer: 59 | Admitting: Cardiovascular Disease

## 2020-10-07 ENCOUNTER — Other Ambulatory Visit: Payer: Self-pay

## 2020-10-07 VITALS — BP 158/106 | HR 68 | Ht 69.0 in | Wt 186.0 lb

## 2020-10-07 DIAGNOSIS — Z87891 Personal history of nicotine dependence: Secondary | ICD-10-CM | POA: Diagnosis not present

## 2020-10-07 DIAGNOSIS — I10 Essential (primary) hypertension: Secondary | ICD-10-CM

## 2020-10-07 DIAGNOSIS — E782 Mixed hyperlipidemia: Secondary | ICD-10-CM | POA: Diagnosis not present

## 2020-10-07 DIAGNOSIS — I25118 Atherosclerotic heart disease of native coronary artery with other forms of angina pectoris: Secondary | ICD-10-CM

## 2020-10-07 MED ORDER — EZETIMIBE 10 MG PO TABS: 10.0000 mg | ORAL_TABLET | Freq: Every day | ORAL | 3 refills | Status: DC

## 2020-10-07 MED ORDER — AMLODIPINE BESYLATE 10 MG PO TABS: 10.0000 mg | ORAL_TABLET | Freq: Every day | ORAL | 6 refills | Status: DC

## 2020-10-07 NOTE — Patient Instructions (Signed)
Medication Instructions:  Please start amlodipine 5 to 10 mg daily for pressure  Add zetia 10 mg daily for cholesterol  If you need a refill on your cardiac medications before your next appointment, please call your pharmacy.    Lab work: No new labs needed   If you have labs (blood work) drawn today and your tests are completely normal, you will receive your results only by: Marland Kitchen MyChart Message (if you have MyChart) OR . A paper copy in the mail If you have any lab test that is abnormal or we need to change your treatment, we will call you to review the results.   Testing/Procedures: No new testing needed   Follow-Up: At [facility], you and your health needs are our priority.  As part of our continuing mission to provide you with exceptional heart care, we have created designated Provider Care Teams.  These Care Teams include your primary Cardiologist (physician) and Advanced Practice Providers (APPs -  Physician Assistants and Nurse Practitioners) who all work together to provide you with the care you need, when you need it.  . You will need a follow up appointment in 12 months  . Providers on your designated Care Team:   . Nicolasa Ducking, NP . Eula Listen, PA-C . Marisue Ivan, PA-C  Any Other Special Instructions Will Be Listed Below (If Applicable).  COVID-19 Vaccine Information can be found at: [email or web address] For questions related to vaccine distribution or appointments, please email [email or web address] or call [phone].

## 2020-10-08 NOTE — Telephone Encounter (Signed)
Pt in to see Dr. Mariah Milling for office visit yesterday 1/25. Pt to have labs at his work.

## 2020-10-28 ENCOUNTER — Ambulatory Visit: Payer: 59 | Admitting: Family

## 2020-10-28 ENCOUNTER — Encounter: Payer: Self-pay | Admitting: Family

## 2020-10-28 ENCOUNTER — Other Ambulatory Visit: Payer: Self-pay

## 2020-10-28 VITALS — BP 126/78 | HR 63 | Ht 70.0 in | Wt 188.0 lb

## 2020-10-28 DIAGNOSIS — R079 Chest pain, unspecified: Secondary | ICD-10-CM | POA: Diagnosis not present

## 2020-10-28 DIAGNOSIS — E782 Mixed hyperlipidemia: Secondary | ICD-10-CM

## 2020-10-28 DIAGNOSIS — I25118 Atherosclerotic heart disease of native coronary artery with other forms of angina pectoris: Secondary | ICD-10-CM | POA: Diagnosis not present

## 2020-10-28 DIAGNOSIS — I10 Essential (primary) hypertension: Secondary | ICD-10-CM | POA: Diagnosis not present

## 2020-10-28 MED ORDER — NITROGLYCERIN 0.4 MG SL SUBL: 0.4000 mg | SUBLINGUAL_TABLET | SUBLINGUAL | 3 refills | Status: DC | PRN

## 2020-10-28 MED ORDER — EZETIMIBE 10 MG PO TABS: 10.0000 mg | ORAL_TABLET | Freq: Every day | ORAL | 3 refills | Status: DC

## 2020-10-28 NOTE — Progress Notes (Addendum)
Office Visit    Patient Name: Jeff Fields Date of Encounter: 10/28/2020  Primary Care Provider:  Dorcas Carrow, DO Primary Cardiologist:  Julien Nordmann, MD Electrophysiologist:  None   Chief Complaint    Jeff Fields is a 60 y.o. male with a hx of CAD, HTN, HLD, COVID-01 June 2020 not requiring hospitalization presents today for chest discomfort.  Past Medical History    Past Medical History:  Diagnosis Date  . Coronary artery disease    a. LHC 10/2014: mid LAD 99% stenosis s/p PCI/DES, 40% stenosis in the proximal LAD, D1 40% stenosis at the ostium of the vessel, EF > 55%  . Hyperlipidemia   . Hypertension    Past Surgical History:  Procedure Laterality Date  . CARDIAC CATHETERIZATION  11/08/2014  . CORONARY ANGIOPLASTY WITH STENT PLACEMENT  11/08/2014   drug eluting stent placement to the mid LAD  . VASECTOMY  2000    Allergies  No Known Allergies  History of Present Illness    Jeff Fields is a 60 y.o. male with a hx of coronary artery disease s/p stent placement to mid LAD 11/08/2014, hypertension, hyperlipidemia, COVID-19 05/2020 not requiring hospitalization last seen 10/07/2020 by Dr. Mariah Milling.   He underwent cardiac catheterization with stent placement to mid LAD February 2016.  He has previously been intolerant to atorvastatin 40 mg daily but has tolerated Crestor 5 mg daily without difficulty.  When last seen by Dr. Mariah Milling his amlodipine was titrated to 10 mg daily due to uncontrolled blood pressure. Noted previous intolerance to HCTZ.  Zetia 10 mg daily was also added to his regimen of Crestor 5 mg daily to obtain goal of LDL less than 70.   Presents today for follow-up.  Was tolerating Zetia 10 mg daily without difficulty.  He tells me that when he took 10 mg of amlodipine his blood pressure was routinely systolic 1 teens associated lightheadedness and as such he reduce back to 5 mg daily.  Reports home blood pressure well controlled 120s to 130s over 70s.   Endorses a month or 2 history of chest discomfort.  Feels like a pressure in his mid sternal region.  Tells me he has not improved with improvement in his blood pressure and occurs intermittently. Occurs more often with exertion and that he is reduced his level of activity to not exacerbate it. Tells me it "does not feel good" when it happens.Not associated with shortness of breath nor diaphoresis. Tells me prior to his stent in 2016 he noted this feels a bit different and was more painful sensation at that time.  Tells me this is also different than his previous indigestion.  The discomfort does not seem to be attributed with mealtimes.  EKGs/Labs/Other Studies Reviewed:   The following studies were reviewed today:  EKG:  EKG is ordered today.  The ekg ordered today demonstrates NSR 763 bpm with left axis deviation and no acute ST/T wave changes.  Recent Labs: 03/04/2020: ALT 21; Hemoglobin 14.8; Platelets 230 09/26/2020: BUN 16; Creatinine, Ser 0.98; Potassium 4.5; Sodium 140  Recent Lipid Panel    Component Value Date/Time   CHOL 151 03/04/2020 1653   TRIG 120 03/04/2020 1653   HDL 48 03/04/2020 1653   LDLCALC 82 03/04/2020 1653    Home Medications   No outpatient medications have been marked as taking for the 10/28/20 encounter (Appointment) with Alver Sorrow, NP.   Current Facility-Administered Medications for the 10/28/20 encounter (Appointment) with  Alver Sorrow, NP  Medication  . hydrALAZINE (APRESOLINE) tablet 25 mg      Review of Systems       All other systems reviewed and are otherwise negative except as noted above.  Physical Exam    VS:  There were no vitals taken for this visit. , BMI There is no height or weight on file to calculate BMI. GEN: Well nourished, well developed, in no acute distress. HEENT: normal. Neck: Supple, no JVD, carotid bruits, or masses. Cardiac: RRR, no murmurs, rubs, or gallops. No clubbing, cyanosis, edema.  Radials/DP/PT 2+ and  equal bilaterally.  Respiratory:  Respirations regular and unlabored, clear to auscultation bilaterally. GI: Soft, nontender, nondistended, BS + x 4. MS: No deformity or atrophy. Skin: Warm and dry, no rash. Neuro:  Strength and sensation are intact. Psych: Normal affect.  Assessment & Plan   1. CAD - Previous DES 2016. EKG today with no acute ST/T wave changes. Endorses approx 1 month history of midsternal discomfort that is worse with exertion not associated with shortness of breath nor diaphoresis. Concern for worsening ischemia though notes symptoms are not as bad as previous angina 2016. Plan for exercise myoview. Rx PRN Nitroglycerin. Prompt follow up in 3 weeks. If myoview unremarkable consider potential etiology GERD vs hiatal hernia though notes symptoms are different than previous GERD. GDMT includes aspirin, coreg, rosuvastatin, zetia. The risks [chest pain, shortness of breath, cardiac arrhythmias, dizziness, blood pressure fluctuations, myocardial infarction, stroke/transient ischemic attack, nausea, vomiting, allergic reaction, radiation exposure, metallic taste sensation and life-threatening complications (estimated to be 1 in 10,000)], benefits (risk stratification, diagnosing coronary artery disease, treatment guidance) and alternatives of a nuclear stress test were discussed in detail with Jeff Fields and he agrees to proceed.  2. HLD, LDL < 70 -continue rosuvastatin 5 mg daily.  Zetia 10 mg daily added at last clinic visit 3 weeks ago. Consider repeat lipid panel in 6-8 weeks.  3. HTN - BP well controlled. Continue current antihypertensive regimen including amlodipine 5 mg daily, Coreg 25 mg twice daily, olmesartan 40 mg daily.  Disposition:  Follow up in 3 week(s) with Dr. Charlena Cross, NP 10/28/2020, 8:39 AM

## 2020-10-28 NOTE — Patient Instructions (Addendum)
Medication Instructions:  Your physician has recommended you make the following change in your medication:   START Nitroglycerin as needed for chest pain  If you have chest pain, take 1 nitroglycerin under the tongue and wait 5 minutes. If you still have chest pain, take a second tablet and wait 5 minutes. If you still have chest pain, take a third tablet and seek emergency evaluation.   *If you need a refill on your cardiac medications before your next appointment, please call your pharmacy*   Lab Work: None ordered today.   Testing/Procedures:  [facility] MYOVIEW  Your caregiver has ordered an Exercise Myoview: Stress Test with nuclear imaging. The purpose of this test is to evaluate the blood supply to your heart muscle. This procedure is referred to as a "Non-Invasive Stress Test." This is because other than having an IV started in your vein, nothing is inserted or "invades" your body. Cardiac stress tests are done to find areas of poor blood flow to the heart by determining the extent of coronary artery disease (CAD). Some patients exercise on a treadmill, which naturally increases the blood flow to your heart, while others who are  unable to walk on a treadmill due to physical limitations have a pharmacologic/chemical stress agent called Lexiscan . This medicine will mimic walking on a treadmill by temporarily increasing your coronary blood flow.   Please note: these test may take anywhere between 2-4 hours to complete  PLEASE REPORT TO [facility] MEDICAL MALL ENTRANCE  THE VOLUNTEERS AT THE FIRST DESK WILL DIRECT YOU WHERE TO GO  Date of Procedure:_____________________________________  Arrival Time for Procedure:______________________________  Instructions regarding medication:    __X__:  Hold betablocker(s) night before procedure and morning of procedure - CARVEDILOL (COREG)   PLEASE NOTIFY THE OFFICE AT LEAST 24 HOURS IN ADVANCE IF YOU ARE UNABLE TO KEEP YOUR APPOINTMENT.   [phone] AND  PLEASE NOTIFY NUCLEAR MEDICINE AT [facility] AT LEAST 24 HOURS IN ADVANCE IF YOU ARE UNABLE TO KEEP YOUR APPOINTMENT. [phone]  How to prepare for your Myoview test:  1. Do not eat or drink after midnight 2. No caffeine for 24 hours prior to test 3. No smoking 24 hours prior to test. 4. Your medication may be taken with water.  If your doctor stopped a medication because of this test, do not take that medication. 5. Please wear a short sleeve shirt. 6. No perfume, cologne or lotion. 7. Wear comfortable walking shoes.    COVID PRE- TEST: You will need a COVID TEST prior to the procedure:  LOCATION: [facility]Op [facility]-Thru Testing site.  DATE/TIME:                          (8:00 am- 10:00 am)    Follow-Up: At [facility], you and your health needs are our priority.  As part of our continuing mission to provide you with exceptional heart care, we have created designated Provider Care Teams.  These Care Teams include your primary Cardiologist (physician) and Advanced Practice Providers (APPs -  Physician Assistants and Nurse Practitioners) who all work together to provide you with the care you need, when you need it.  We recommend signing up for the patient portal called "MyChart".  Sign up information is provided on this After Visit Summary.  MyChart is used to connect with patients for Virtual Visits (Telemedicine).  Patients are able to view lab/test results, encounter notes, upcoming appointments, etc.  Non-urgent  messages can be sent to your provider as well.   To learn more about what you can do with MyChart, go to [email or web address].    Your next appointment:   1 week AFTER Exercise Myoview  The format for your next appointment:   In Person  Provider:   You may see Julien Nordmann, MD or one of the following Advanced Practice Providers on your designated Care Team:    Nicolasa Ducking, NP  Eula Listen, PA-C  Marisue Ivan,  PA-C  Cadence Fransico Michael, [place]  Gillian Shields, NP  Other Instructions  Heart Healthy Diet Recommendations: A low-salt diet is recommended. Meats should be grilled, baked, or boiled. Avoid fried foods. Focus on lean protein sources like fish or chicken with vegetables and fruits. The American Heart Association is a Chief Technology Officer!  American Heart Association Diet and Lifeystyle Recommendations   Exercise recommendations: The American Heart Association recommends 150 minutes of moderate intensity exercise weekly. Try 30 minutes of moderate intensity exercise 4-5 times per week. This could include walking, jogging, or swimming.

## 2020-10-29 ENCOUNTER — Telehealth: Payer: Self-pay | Admitting: *Deleted

## 2020-10-29 MED ORDER — NITROGLYCERIN 0.4 MG SL SUBL: 0.4000 mg | SUBLINGUAL_TABLET | SUBLINGUAL | 3 refills | Status: DC | PRN

## 2020-10-29 NOTE — Telephone Encounter (Signed)
Pt had requested that Nitroglycerin Rx be sent to [facility] pharmacy.  Rx reordered for [facility] and cancelled at [facility] that was previously sent yesterday in clinic.

## 2020-11-04 ENCOUNTER — Ambulatory Visit: Payer: 59 | Admitting: Nurse Practitioner

## 2020-11-13 ENCOUNTER — Other Ambulatory Visit
Admission: RE | Admit: 2020-11-13 | Discharge: 2020-11-13 | Disposition: A | Discharge status: Home / Self Care | Payer: 59 | Source: Ambulatory Visit | Attending: Cardiovascular Disease | Admitting: Cardiovascular Disease

## 2020-11-13 ENCOUNTER — Other Ambulatory Visit: Payer: Self-pay

## 2020-11-13 DIAGNOSIS — Z20822 Contact with and (suspected) exposure to covid-19: Secondary | ICD-10-CM | POA: Insufficient documentation

## 2020-11-13 DIAGNOSIS — Z01812 Encounter for preprocedural laboratory examination: Secondary | ICD-10-CM | POA: Insufficient documentation

## 2020-11-13 LAB — SARS CORONAVIRUS 2 (TAT 6-24 HRS): SARS Coronavirus 2: NEGATIVE

## 2020-11-14 ENCOUNTER — Ambulatory Visit
Admission: RE | Admit: 2020-11-14 | Discharge: 2020-11-14 | Disposition: A | Discharge status: Home / Self Care | Payer: 59 | Source: Ambulatory Visit | Attending: Family | Admitting: Family

## 2020-11-14 DIAGNOSIS — R079 Chest pain, unspecified: Secondary | ICD-10-CM | POA: Insufficient documentation

## 2020-11-14 LAB — NM MYOCAR MULTI W/SPECT W/WALL MOTION / EF
Estimated workload: 13.4 METS
Exercise duration (min): 11 min
Exercise duration (sec): 1 s
LV dias vol: 102 mL (ref 62–150)
LV sys vol: 45 mL
MPHR: 161 {beats}/min
Peak HR: 134 {beats}/min
Percent HR: 83 %
Rest HR: 59 {beats}/min
SDS: 2
SRS: 0
SSS: 2
TID: 0.8

## 2020-11-14 MED ORDER — TECHNETIUM TC 99M TETROFOSMIN IV KIT
10.1940 | PACK | Freq: Once | INTRAVENOUS | Status: AC | PRN
  Administered 2020-11-14: 10.194 via INTRAVENOUS

## 2020-11-14 MED ORDER — TECHNETIUM TC 99M TETROFOSMIN IV KIT
27.6360 | PACK | Freq: Once | INTRAVENOUS | Status: AC | PRN
  Administered 2020-11-14: 27.636 via INTRAVENOUS

## 2020-11-14 MED ORDER — REGADENOSON 0.4 MG/5ML IV SOLN
0.4000 mg | Freq: Once | INTRAVENOUS | Status: AC
  Administered 2020-11-14: 0.4 mg via INTRAVENOUS

## 2020-11-17 ENCOUNTER — Ambulatory Visit: Payer: 59 | Admitting: Cardiovascular Disease

## 2020-11-18 ENCOUNTER — Ambulatory Visit: Payer: 59 | Admitting: Family

## 2020-12-30 ENCOUNTER — Other Ambulatory Visit: Payer: Self-pay | Admitting: Cardiovascular Disease

## 2020-12-30 DIAGNOSIS — I25118 Atherosclerotic heart disease of native coronary artery with other forms of angina pectoris: Secondary | ICD-10-CM

## 2020-12-30 DIAGNOSIS — E782 Mixed hyperlipidemia: Secondary | ICD-10-CM

## 2021-02-17 ENCOUNTER — Other Ambulatory Visit: Payer: Self-pay | Admitting: Family Medicine

## 2021-02-17 DIAGNOSIS — M5440 Lumbago with sciatica, unspecified side: Secondary | ICD-10-CM

## 2021-03-31 ENCOUNTER — Other Ambulatory Visit: Payer: Self-pay | Admitting: Cardiovascular Disease

## 2021-03-31 DIAGNOSIS — I25118 Atherosclerotic heart disease of native coronary artery with other forms of angina pectoris: Secondary | ICD-10-CM

## 2021-03-31 DIAGNOSIS — E782 Mixed hyperlipidemia: Secondary | ICD-10-CM

## 2021-03-31 NOTE — Telephone Encounter (Signed)
Please reschedule overdue F/U appointment (F/U 1 week after myoview). Patient previously cancelled due to work conflict. Thank you!

## 2021-04-24 ENCOUNTER — Other Ambulatory Visit: Payer: Self-pay

## 2021-04-24 ENCOUNTER — Encounter: Payer: Self-pay | Admitting: Nurse Practitioner

## 2021-04-24 ENCOUNTER — Ambulatory Visit: Payer: 59 | Admitting: Nurse Practitioner

## 2021-04-24 VITALS — BP 142/86 | HR 69 | Temp 97.9°F | Resp 18 | Wt 185.4 lb

## 2021-04-24 DIAGNOSIS — G479 Sleep disorder, unspecified: Secondary | ICD-10-CM

## 2021-04-24 DIAGNOSIS — M5441 Lumbago with sciatica, right side: Secondary | ICD-10-CM

## 2021-04-24 MED ORDER — MELOXICAM 15 MG PO TABS: 15.0000 mg | ORAL_TABLET | Freq: Every day | ORAL | 0 refills | Status: DC | PRN

## 2021-04-24 MED ORDER — TRAZODONE HCL 50 MG PO TABS: 25.0000 mg | ORAL_TABLET | Freq: Every evening | ORAL | 1 refills | Status: DC | PRN

## 2021-04-24 MED ORDER — CYCLOBENZAPRINE HCL 10 MG PO TABS: 10.0000 mg | ORAL_TABLET | Freq: Three times a day (TID) | ORAL | 0 refills | Status: DC | PRN

## 2021-04-24 NOTE — Assessment & Plan Note (Signed)
Acute flare of chronic back pain. Will treat with meloxicam and flexeril prn. He declines toradol IM today. Exercises provided. Continue heat and rest. Note given to stay out of work until Monday 8/15.

## 2021-04-24 NOTE — Progress Notes (Signed)
Acute Office Visit  Subjective:    Patient ID: Jeff Fields, male    DOB: June 08, 1961, 60 y.o.   MRN: [id number]  Chief Complaint  Patient presents with   Back Pain    Back pain started yesterday after working on a chicken coup    HPI Patient is in today for back pain and spasms for one day after working outside yesterday. He has a history of lower back pain in the past and meloxicam and flexeril have helped in the past.   BACK PAIN  Duration: days Mechanism of injury:  working on a chicken coup Location: low back Onset: sudden Severity: 4/10 Quality: sharp and burning Frequency: constant Radiation: R leg below the knee Aggravating factors: lifting, movement, walking, laying, and bending Alleviating factors: rest, ice, heat, and NSAIDs Status: worse Treatments attempted: rest, ice, heat, and ibuprofen  Relief with NSAIDs?: mild Nighttime pain:  no Paresthesias / decreased sensation:  some in right leg Bowel / bladder incontinence:  no Fevers:  no Dysuria / urinary frequency:  no  INSOMNIA  Duration: months Satisfied with sleep quality: no Difficulty falling asleep: no Difficulty staying asleep: yes Waking a few hours after sleep onset: yes Early morning awakenings: yes Daytime hypersomnolence: yes Wakes feeling refreshed: no Good sleep hygiene: yes Apnea: no Snoring: no Depressed/anxious mood: no Recent stress: no Restless legs/nocturnal leg cramps: no Chronic pain/arthritis: no History of sleep study: no Treatments attempted: melatonin     Past Medical History:  Diagnosis Date   Coronary artery disease    a. LHC 10/2014: mid LAD 99% stenosis s/p PCI/DES, 40% stenosis in the proximal LAD, D1 40% stenosis at the ostium of the vessel, EF > 55%   Hyperlipidemia    Hypertension     Past Surgical History:  Procedure Laterality Date   CARDIAC CATHETERIZATION  11/08/2014   CORONARY ANGIOPLASTY WITH STENT PLACEMENT  11/08/2014   drug eluting stent placement  to the mid LAD   VASECTOMY  2000    Family History  Problem Relation Age of Onset   Hypertension Father    Arrhythmia Father        Pacemaker implant   Hypertension Brother    Arthritis Brother    Stroke Maternal Grandmother    Lung cancer Maternal Grandfather    Hypertension Mother     Social History   Socioeconomic History   Marital status: Married    Spouse name: Not on file   Number of children: Not on file   Years of education: Not on file   Highest education level: Not on file  Occupational History   Not on file  Tobacco Use   Smoking status: Former    Packs/day: 1.00    Types: Cigarettes    Quit date: 09/13/1988    Years since quitting: 32.6   Smokeless tobacco: Never  Vaping Use   Vaping Use: Never used  Substance and Sexual Activity   Alcohol use: Yes    Alcohol/week: 0.0 standard drinks    Comment: Rarely   Drug use: No   Sexual activity: Yes  Other Topics Concern   Not on file  Social History Narrative   Not on file   Social Determinants of Health   Financial Resource Strain: Not on file  Food Insecurity: Not on file  Transportation Needs: Not on file  Physical Activity: Not on file  Stress: Not on file  Social Connections: Not on file  Intimate Partner Violence: Not on file  Outpatient Medications Prior to Visit  Medication Sig Dispense Refill   amLODipine (NORVASC) 10 MG tablet Take 5 mg by mouth daily.     aspirin 81 MG tablet Take 81 mg by mouth daily.     carvedilol (COREG) 25 MG tablet Take 1 tablet (25 mg total) by mouth 2 (two) times daily. 180 tablet 3   Coenzyme Q10 (COQ-10) 200 MG CAPS Take 200 mg by mouth daily.     ezetimibe (ZETIA) 10 MG tablet Take 1 tablet (10 mg total) by mouth daily. 90 tablet 3   nitroGLYCERIN (NITROSTAT) 0.4 MG SL tablet Place 1 tablet (0.4 mg total) under the tongue every 5 (five) minutes as needed for chest pain. No more than 3 doses. 25 tablet 3   rosuvastatin (CRESTOR) 5 MG tablet TAKE 1 TABLET DAILY  90 tablet 0   olmesartan (BENICAR) 40 MG tablet Take 1 tablet (40 mg total) by mouth daily. 30 tablet 1   hydrALAZINE (APRESOLINE) tablet 25 mg      No facility-administered medications prior to visit.    No Known Allergies  Review of Systems  Constitutional:  Positive for fatigue. Negative for fever.  Respiratory: Negative.    Cardiovascular: Negative.   Gastrointestinal: Negative.   Genitourinary: Negative.   Musculoskeletal:  Positive for back pain.  Skin: Negative.   Neurological: Negative.   Psychiatric/Behavioral:  Positive for sleep disturbance. Negative for dysphoric mood. The patient is not nervous/anxious.       Objective:    Physical Exam Vitals and nursing note reviewed.  Constitutional:      Appearance: Normal appearance.  HENT:     Head: Normocephalic.  Eyes:     Conjunctiva/sclera: Conjunctivae normal.  Cardiovascular:     Rate and Rhythm: Normal rate and regular rhythm.     Pulses: Normal pulses.     Heart sounds: Normal heart sounds.  Pulmonary:     Effort: Pulmonary effort is normal.     Breath sounds: Normal breath sounds.  Musculoskeletal:        General: Tenderness (lower back with palpation and movement) present. Normal range of motion.     Cervical back: Normal range of motion.  Skin:    General: Skin is warm.  Neurological:     General: No focal deficit present.     Mental Status: He is alert and oriented to person, place, and time.  Psychiatric:        Mood and Affect: Mood normal.        Behavior: Behavior normal.        Thought Content: Thought content normal.        Judgment: Judgment normal.    BP (!) 142/86 (BP Location: Right Arm, Patient Position: Sitting)   Pulse 69   Temp 97.9 F (36.6 C) (Oral)   Resp 18   Wt 185 lb 6.4 oz (84.1 kg)   SpO2 98%   BMI 26.60 kg/m  Wt Readings from Last 3 Encounters:  04/24/21 185 lb 6.4 oz (84.1 kg)  10/28/20 188 lb (85.3 kg)  10/07/20 186 lb (84.4 kg)    Health Maintenance Due   Topic Date Due   COVID-19 Vaccine (1) Never done   Pneumococcal Vaccine 47-1 Years old (1 - PCV) Never done   HIV Screening  Never done   Hepatitis C Screening  Never done   Zoster Vaccines- Shingrix (1 of 2) Never done   COLONOSCOPY (Pts 45-48yrs Insurance coverage will need to be confirmed)  Never  done   INFLUENZA VACCINE  04/13/2021    There are no preventive care reminders to display for this patient.   Lab Results  Component Value Date   TSH 2.290 05/10/2017   Lab Results  Component Value Date   WBC 8.4 03/04/2020   HGB 14.8 03/04/2020   HCT 41.4 03/04/2020   MCV 91 03/04/2020   PLT 230 03/04/2020   Lab Results  Component Value Date   NA 140 09/26/2020   K 4.5 09/26/2020   CO2 28 09/26/2020   GLUCOSE 106 (H) 09/26/2020   BUN 16 09/26/2020   CREATININE 0.98 09/26/2020   BILITOT 1.1 03/04/2020   ALKPHOS 73 03/04/2020   AST 26 03/04/2020   ALT 21 03/04/2020   PROT 7.3 03/04/2020   ALBUMIN 5.0 (H) 03/04/2020   CALCIUM 9.5 09/26/2020   ANIONGAP 7 09/26/2020   Lab Results  Component Value Date   CHOL 151 03/04/2020   Lab Results  Component Value Date   HDL 48 03/04/2020   Lab Results  Component Value Date   LDLCALC 82 03/04/2020   Lab Results  Component Value Date   TRIG 120 03/04/2020   No results found for: CHOLHDL No results found for: QION6E     Assessment & Plan:   Problem List Items Addressed This Visit       Other   Low back pain - Primary    Acute flare of chronic back pain. Will treat with meloxicam and flexeril prn. He declines toradol IM today. Exercises provided. Continue heat and rest. Note given to stay out of work until Monday 8/15.       Relevant Medications   cyclobenzaprine (FLEXERIL) 10 MG tablet   meloxicam (MOBIC) 15 MG tablet   Sleep disturbance    Has had trouble staying asleep for the past month. He has tried melatonin OTC with no relief. He has good sleep hygiene and night time routine. Will start trazadone 25-50mg   qHS prn. Follow up in 4 weeks or sooner with concerns.         Meds ordered this encounter  Medications   cyclobenzaprine (FLEXERIL) 10 MG tablet    Sig: Take 1 tablet (10 mg total) by mouth 3 (three) times daily as needed for muscle spasms.    Dispense:  30 tablet    Refill:  0   meloxicam (MOBIC) 15 MG tablet    Sig: Take 1 tablet (15 mg total) by mouth daily as needed for pain.    Dispense:  30 tablet    Refill:  0   traZODone (DESYREL) 50 MG tablet    Sig: Take 0.5-1 tablets (25-50 mg total) by mouth at bedtime as needed for sleep.    Dispense:  30 tablet    Refill:  1     Gerre Scull, NP

## 2021-04-24 NOTE — Assessment & Plan Note (Signed)
Has had trouble staying asleep for the past month. He has tried melatonin OTC with no relief. He has good sleep hygiene and night time routine. Will start trazadone 25-50mg  qHS prn. Follow up in 4 weeks or sooner with concerns.

## 2021-04-26 ENCOUNTER — Other Ambulatory Visit: Payer: Self-pay | Admitting: Family

## 2021-04-26 DIAGNOSIS — I10 Essential (primary) hypertension: Secondary | ICD-10-CM

## 2021-04-26 DIAGNOSIS — I25118 Atherosclerotic heart disease of native coronary artery with other forms of angina pectoris: Secondary | ICD-10-CM

## 2021-05-15 ENCOUNTER — Ambulatory Visit: Payer: 59 | Admitting: Cardiovascular Disease

## 2021-05-19 NOTE — Progress Notes (Signed)
Patient ID: Jeff Fields, male   DOB: 1960-09-22, 60 y.o.   MRN: [id number] Cardiology Office Note  Date:  05/20/2021   ID:  Jeff Fields, DOB Nov 06, 1960, MRN [id number]  PCP:  Dorcas Carrow, DO   Chief Complaint  Patient presents with   Follow-up    HPI:  Mr Jeff Fields is a 60 year old gentleman with history of remote smoking history,  initially presenting with chest pain on exertion,    stress test that showed anterior wall ischemia, cardiac catheterization and stent placement to his mid LAD 11/08/14.  He presents today for follow-up of his coronary artery disease  In follow-up today reports feeling well Denies chest pain concerning for angina Busy at work, does Engineer, manufacturing  Discussed prior evaluation  Stress test exercise: Exercise myocardial perfusion imaging study with no significant  Ischemia Target heart rate achieved,  Hypertensive with exercise, peak blood pressure 206/90 Normal wall motion, EF estimated at 50% No EKG changes concerning for ischemia at peak stress or in recovery. Low risk scan  No recent lipid panel in 2022 No regular exercise program  Tolerating low-dose Crestor daily 5 mg Previously with issues with higher dose statins  EKG personally reviewed by myself on todays visit Normal sinus rhythm rate 64 bpm no significant ST-T wave changes   Father diagnosed with testicular cancer requiring treatment. Patient found a lump on his testicle and after significant workup, this was found to be a benign finding  PMH:   has a past medical history of Coronary artery disease, Hyperlipidemia, and Hypertension. Also history of coronary artery disease, prior stent  PSH:    Past Surgical History:  Procedure Laterality Date   CARDIAC CATHETERIZATION  11/08/2014   CORONARY ANGIOPLASTY WITH STENT PLACEMENT  11/08/2014   drug eluting stent placement to the mid LAD   VASECTOMY  2000    Current Outpatient Medications  Medication Sig Dispense Refill    amLODipine (NORVASC) 10 MG tablet Take 5 mg by mouth daily.     aspirin 81 MG tablet Take 81 mg by mouth daily.     carvedilol (COREG) 25 MG tablet Take 1 tablet by mouth twice daily 60 tablet 3   Coenzyme Q10 (COQ-10) 200 MG CAPS Take 200 mg by mouth daily.     cyclobenzaprine (FLEXERIL) 10 MG tablet Take 1 tablet (10 mg total) by mouth 3 (three) times daily as needed for muscle spasms. 30 tablet 0   ezetimibe (ZETIA) 10 MG tablet Take 1 tablet (10 mg total) by mouth daily. 90 tablet 3   meloxicam (MOBIC) 15 MG tablet Take 1 tablet (15 mg total) by mouth daily as needed for pain. 30 tablet 0   nitroGLYCERIN (NITROSTAT) 0.4 MG SL tablet Place 1 tablet (0.4 mg total) under the tongue every 5 (five) minutes as needed for chest pain. No more than 3 doses. 25 tablet 3   rosuvastatin (CRESTOR) 5 MG tablet TAKE 1 TABLET DAILY 90 tablet 0   traZODone (DESYREL) 50 MG tablet Take 0.5-1 tablets (25-50 mg total) by mouth at bedtime as needed for sleep. 30 tablet 1   No current facility-administered medications for this visit.     Allergies:   Patient has no known allergies.   Social History:  The patient  reports that he quit smoking about 32 years ago. His smoking use included cigarettes. He smoked an average of 1 pack per day. He has never used smokeless tobacco. He reports current alcohol use. He reports that he  does not use drugs.   Family History:   family history includes Arrhythmia in his father; Arthritis in his brother; Hypertension in his brother, father, and mother; Lung cancer in his maternal grandfather; Stroke in his maternal grandmother.    Review of Systems: Review of Systems  Constitutional: Negative.   HENT: Negative.    Respiratory: Negative.    Cardiovascular: Negative.   Gastrointestinal: Negative.   Musculoskeletal: Negative.   Skin: Negative.   Neurological: Negative.   Psychiatric/Behavioral: Negative.    All other systems reviewed and are negative.  PHYSICAL  EXAM: VS:  BP 130/90 (BP Location: Left Arm, Patient Position: Sitting, Cuff Size: Normal)   Pulse 64   Ht 5\' 9"  (1.753 m)   Wt 190 lb (86.2 kg)   SpO2 97%   BMI 28.06 kg/m  , BMI Body mass index is 28.06 kg/m. Constitutional:  oriented to person, place, and time. No distress.  HENT:  Head: Grossly normal Eyes:  no discharge. No scleral icterus.  Neck: No JVD, no carotid bruits  Cardiovascular: Regular rate and rhythm, no murmurs appreciated Pulmonary/Chest: Clear to auscultation bilaterally, no wheezes or rails Abdominal: Soft.  no distension.  no tenderness.  Musculoskeletal: Normal range of motion Neurological:  normal muscle tone. Coordination normal. No atrophy Skin: Skin warm and dry Psychiatric: normal affect, pleasant  Recent Labs: 09/26/2020: BUN 16; Creatinine, Ser 0.98; Potassium 4.5; Sodium 140    Lipid Panel Lab Results  Component Value Date   CHOL 151 03/04/2020   HDL 48 03/04/2020   LDLCALC 82 03/04/2020   TRIG 120 03/04/2020      Wt Readings from Last 3 Encounters:  05/20/21 190 lb (86.2 kg)  04/24/21 185 lb 6.4 oz (84.1 kg)  10/28/20 188 lb (85.3 kg)     ASSESSMENT AND PLAN:  Coronary artery disease involving native coronary artery of native heart without angina pectoris Discussed prior stress test March 2022 Currently with no symptoms of angina. No further workup at this time. Continue current medication regimen.  Hyperlipidemia on Crestor 5 daily, Zetia 10 mg daily  goal LDL less than 60 Difficulty tolerating higher dose statin  Essential hypertension Blood pressure is well controlled on today's visit. No changes made to the medications.  S/P coronary artery stent placement Denies anginal symptoms   Total encounter time more than 25 minutes  Greater than 50% was spent in counseling and coordination of care with the patient   Orders Placed This Encounter  Procedures   EKG 12-Lead     Signed, April 2022, M.D., Ph.D. 05/20/2021   [facility] [place], [place]

## 2021-05-20 ENCOUNTER — Encounter: Payer: Self-pay | Admitting: Cardiovascular Disease

## 2021-05-20 ENCOUNTER — Other Ambulatory Visit: Payer: Self-pay

## 2021-05-20 ENCOUNTER — Ambulatory Visit: Payer: 59 | Admitting: Cardiovascular Disease

## 2021-05-20 VITALS — BP 130/90 | HR 64 | Ht 69.0 in | Wt 190.0 lb

## 2021-05-20 DIAGNOSIS — I25118 Atherosclerotic heart disease of native coronary artery with other forms of angina pectoris: Secondary | ICD-10-CM | POA: Diagnosis not present

## 2021-05-20 DIAGNOSIS — E782 Mixed hyperlipidemia: Secondary | ICD-10-CM

## 2021-05-20 DIAGNOSIS — I10 Essential (primary) hypertension: Secondary | ICD-10-CM

## 2021-05-20 DIAGNOSIS — R079 Chest pain, unspecified: Secondary | ICD-10-CM

## 2021-05-20 DIAGNOSIS — I998 Other disorder of circulatory system: Secondary | ICD-10-CM

## 2021-05-20 DIAGNOSIS — Z87891 Personal history of nicotine dependence: Secondary | ICD-10-CM

## 2021-05-20 MED ORDER — EZETIMIBE 10 MG PO TABS: 10.0000 mg | ORAL_TABLET | Freq: Every day | ORAL | 3 refills | Status: DC

## 2021-05-20 MED ORDER — ROSUVASTATIN CALCIUM 5 MG PO TABS: 5.0000 mg | ORAL_TABLET | Freq: Every day | ORAL | 4 refills | Status: DC

## 2021-05-20 MED ORDER — CARVEDILOL 25 MG PO TABS: 25.0000 mg | ORAL_TABLET | Freq: Two times a day (BID) | ORAL | 4 refills | Status: DC

## 2021-05-20 MED ORDER — CARVEDILOL 25 MG PO TABS: 25.0000 mg | ORAL_TABLET | Freq: Two times a day (BID) | ORAL | 3 refills | Status: DC

## 2021-05-20 MED ORDER — ROSUVASTATIN CALCIUM 5 MG PO TABS: 5.0000 mg | ORAL_TABLET | Freq: Every day | ORAL | 3 refills | Status: DC

## 2021-05-20 MED ORDER — NITROGLYCERIN 0.4 MG SL SUBL: 0.4000 mg | SUBLINGUAL_TABLET | SUBLINGUAL | 3 refills | Status: DC | PRN

## 2021-05-20 MED ORDER — AMLODIPINE BESYLATE 10 MG PO TABS: 5.0000 mg | ORAL_TABLET | Freq: Every day | ORAL | 3 refills | Status: DC

## 2021-05-20 NOTE — Patient Instructions (Addendum)
Medication Instructions:  No changes  If you need a refill on your cardiac medications before your next appointment, please call your pharmacy.    Lab work: Lipids, CMP  9/19 at 08:00 am Nothing to eat or drink except clear liquids after modnight   Testing/Procedures: No new testing needed  Follow-Up: At [facility], you and your health needs are our priority.  As part of our continuing mission to provide you with exceptional heart care, we have created designated Provider Care Teams.  These Care Teams include your primary Cardiologist (physician) and Advanced Practice Providers (APPs -  Physician Assistants and Nurse Practitioners) who all work together to provide you with the care you need, when you need it.  You will need a follow up appointment in 12 months  Providers on your designated Care Team:   Nicolasa Ducking, NP Eula Listen, PA-C Marisue Ivan, PA-C Cadence Hodgen, [place]  COVID-19 Vaccine Information can be found at: [email or web address] For questions related to vaccine distribution or appointments, please email [email or web address] or call [phone].

## 2021-06-01 ENCOUNTER — Other Ambulatory Visit: Payer: Self-pay

## 2021-06-01 ENCOUNTER — Other Ambulatory Visit (INDEPENDENT_AMBULATORY_CARE_PROVIDER_SITE_OTHER): Payer: 59

## 2021-06-01 DIAGNOSIS — E782 Mixed hyperlipidemia: Secondary | ICD-10-CM

## 2021-06-01 DIAGNOSIS — I10 Essential (primary) hypertension: Secondary | ICD-10-CM

## 2021-06-01 DIAGNOSIS — Z79899 Other long term (current) drug therapy: Secondary | ICD-10-CM

## 2021-06-02 ENCOUNTER — Ambulatory Visit: Payer: 59 | Admitting: Family Medicine

## 2021-06-02 LAB — COMPREHENSIVE METABOLIC PANEL
ALT: 20 IU/L (ref 0–44)
AST: 23 IU/L (ref 0–40)
Albumin/Globulin Ratio: 2.3 — ABNORMAL HIGH (ref 1.2–2.2)
Albumin: 4.6 g/dL (ref 3.8–4.9)
Alkaline Phosphatase: 65 IU/L (ref 44–121)
BUN/Creatinine Ratio: 13 (ref 9–20)
BUN: 14 mg/dL (ref 6–24)
Bilirubin Total: 1.2 mg/dL (ref 0.0–1.2)
CO2: 24 mmol/L (ref 20–29)
Calcium: 9.6 mg/dL (ref 8.7–10.2)
Chloride: 106 mmol/L (ref 96–106)
Creatinine, Ser: 1.1 mg/dL (ref 0.76–1.27)
Globulin, Total: 2 g/dL (ref 1.5–4.5)
Glucose: 102 mg/dL — ABNORMAL HIGH (ref 65–99)
Potassium: 4.2 mmol/L (ref 3.5–5.2)
Sodium: 144 mmol/L (ref 134–144)
Total Protein: 6.6 g/dL (ref 6.0–8.5)
eGFR: 77 mL/min/{1.73_m2} (ref >59)

## 2021-06-02 LAB — SPECIMEN STATUS REPORT

## 2021-06-02 LAB — LIPID PANEL
Chol/HDL Ratio: 2.7 ratio (ref 0.0–5.0)
Cholesterol, Total: 126 mg/dL (ref 100–199)
HDL: 46 mg/dL (ref >39)
LDL Chol Calc (NIH): 63 mg/dL (ref 0–99)
Triglycerides: 87 mg/dL (ref 0–149)
VLDL Cholesterol Cal: 17 mg/dL (ref 5–40)

## 2021-06-30 ENCOUNTER — Other Ambulatory Visit: Payer: Self-pay | Admitting: Cardiovascular Disease

## 2021-06-30 DIAGNOSIS — I25118 Atherosclerotic heart disease of native coronary artery with other forms of angina pectoris: Secondary | ICD-10-CM

## 2021-06-30 DIAGNOSIS — E782 Mixed hyperlipidemia: Secondary | ICD-10-CM

## 2021-07-31 ENCOUNTER — Emergency Department: Payer: 59

## 2021-07-31 ENCOUNTER — Emergency Department
Admission: EM | Admit: 2021-07-31 | Discharge: 2021-07-31 | Disposition: A | Discharge status: Home / Self Care | Payer: 59 | Attending: Emergency Medicine | Admitting: Emergency Medicine

## 2021-07-31 ENCOUNTER — Encounter: Payer: Self-pay | Admitting: Emergency Medicine

## 2021-07-31 ENCOUNTER — Other Ambulatory Visit: Payer: Self-pay

## 2021-07-31 DIAGNOSIS — I10 Essential (primary) hypertension: Secondary | ICD-10-CM | POA: Diagnosis not present

## 2021-07-31 DIAGNOSIS — Z7982 Long term (current) use of aspirin: Secondary | ICD-10-CM | POA: Diagnosis not present

## 2021-07-31 DIAGNOSIS — Z79899 Other long term (current) drug therapy: Secondary | ICD-10-CM | POA: Diagnosis not present

## 2021-07-31 DIAGNOSIS — Z87891 Personal history of nicotine dependence: Secondary | ICD-10-CM | POA: Diagnosis not present

## 2021-07-31 DIAGNOSIS — I2510 Atherosclerotic heart disease of native coronary artery without angina pectoris: Secondary | ICD-10-CM | POA: Insufficient documentation

## 2021-07-31 DIAGNOSIS — K5732 Diverticulitis of large intestine without perforation or abscess without bleeding: Secondary | ICD-10-CM | POA: Diagnosis not present

## 2021-07-31 DIAGNOSIS — R1032 Left lower quadrant pain: Secondary | ICD-10-CM | POA: Diagnosis present

## 2021-07-31 LAB — URINALYSIS, ROUTINE W REFLEX MICROSCOPIC
Bilirubin Urine: NEGATIVE
Glucose, UA: 150 mg/dL — AB
Hgb urine dipstick: NEGATIVE
Ketones, ur: NEGATIVE mg/dL
Leukocytes,Ua: NEGATIVE
Nitrite: NEGATIVE
Protein, ur: NEGATIVE mg/dL
Specific Gravity, Urine: 1.008 (ref 1.005–1.030)
pH: 6 (ref 5.0–8.0)

## 2021-07-31 LAB — COMPREHENSIVE METABOLIC PANEL
ALT: 18 U/L (ref 0–44)
AST: 19 U/L (ref 15–41)
Albumin: 4.5 g/dL (ref 3.5–5.0)
Alkaline Phosphatase: 65 U/L (ref 38–126)
Anion gap: 7 (ref 5–15)
BUN: 13 mg/dL (ref 6–20)
CO2: 27 mmol/L (ref 22–32)
Calcium: 9.3 mg/dL (ref 8.9–10.3)
Chloride: 103 mmol/L (ref 98–111)
Creatinine, Ser: 1.08 mg/dL (ref 0.61–1.24)
GFR, Estimated: >60 mL/min (ref >60)
Glucose, Bld: 211 mg/dL — ABNORMAL HIGH (ref 70–99)
Potassium: 4 mmol/L (ref 3.5–5.1)
Sodium: 137 mmol/L (ref 135–145)
Total Bilirubin: 1.5 mg/dL — ABNORMAL HIGH (ref 0.3–1.2)
Total Protein: 7.2 g/dL (ref 6.5–8.1)

## 2021-07-31 LAB — CBC
HCT: 39.9 % (ref 39.0–52.0)
Hemoglobin: 14.2 g/dL (ref 13.0–17.0)
MCH: 32.8 pg (ref 26.0–34.0)
MCHC: 35.6 g/dL (ref 30.0–36.0)
MCV: 92.1 fL (ref 80.0–100.0)
Platelets: 186 10*3/uL (ref 150–400)
RBC: 4.33 MIL/uL (ref 4.22–5.81)
RDW: 11.8 % (ref 11.5–15.5)
WBC: 9.7 10*3/uL (ref 4.0–10.5)
nRBC: 0 % (ref 0.0–0.2)

## 2021-07-31 LAB — LIPASE, BLOOD: Lipase: 38 U/L (ref 11–51)

## 2021-07-31 MED ORDER — HYDROCODONE-ACETAMINOPHEN 5-325 MG PO TABS: 1.0000 | ORAL_TABLET | Freq: Four times a day (QID) | ORAL | 0 refills | Status: DC | PRN

## 2021-07-31 MED ORDER — AMOXICILLIN-POT CLAVULANATE 875-125 MG PO TABS: 1.0000 | ORAL_TABLET | Freq: Two times a day (BID) | ORAL | 0 refills | Status: AC

## 2021-07-31 NOTE — ED Triage Notes (Signed)
Pt comes into the ED via POV c/o LLQ abd pain that started yesterday and got worse over night.  Pt denies any N/V/D.  Pt does admit to a "weaker stream" when urinating and he has a h/o kidney stones.  Pt states this feels different from his kidney stone in the past.  Pt ambulatory to triage at this time with even and unlabored respirations.  PT also admits to a burning sensation in the low abd and back.

## 2021-07-31 NOTE — ED Provider Notes (Signed)
[facility] Emergency Department Provider Note   ____________________________________________    I have reviewed the triage vital signs and the nursing notes.   HISTORY  Chief Complaint Abdominal Pain     HPI Jeff Fields is a 60 y.o. male with history of CAD, high blood pressure who presents with complaints of left lower quadrant nominal pain.  Patient reports the pain started yesterday gradually.  He reports it is worsened overnight.  He denies hematuria.  History of kidney stone but this feels different.  No history of abdominal surgery.  No nausea or vomiting.  No fevers.  Past Medical History:  Diagnosis Date   Coronary artery disease    a. LHC 10/2014: mid LAD 99% stenosis s/p PCI/DES, 40% stenosis in the proximal LAD, D1 40% stenosis at the ostium of the vessel, EF > 55%   Hyperlipidemia    Hypertension     Patient Active Problem List   Diagnosis Date Noted   Abdominal pain, left lower quadrant 03/07/2020   Anxiety 02/12/2015   Benign hypertension 02/12/2015   Back muscle spasm 02/12/2015   Disorder of male genital organ 02/12/2015   Sleep disturbance 02/12/2015   Sprain of wrist 02/12/2015   CAD (coronary artery disease) 11/27/2014   S/P coronary artery stent placement 11/27/2014   Hyperlipidemia 11/27/2014   Angina pectoris (HCC) 09/17/2014   Smoking hx 09/17/2014   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 Authorizing Provider  amoxicillin-clavulanate (AUGMENTIN) 875-125 MG tablet Take 1 tablet by mouth 2 (two) times daily for 7 days. 07/31/21 08/07/21 Yes Jene Every, MD  HYDROcodone-acetaminophen (NORCO/VICODIN) 5-325 MG  tablet Take 1 tablet by mouth every 6 (six) hours as needed for severe pain. 07/31/21  Yes Jene Every, MD  amLODipine (NORVASC) 10 MG tablet Take 0.5 tablets (5 mg total) by mouth daily. 05/20/21   Antonieta Iba, MD  aspirin 81 MG tablet Take 81 mg by mouth daily.    [provider]  carvedilol (COREG) 25 MG tablet Take 1 tablet (25 mg total) by mouth 2 (two) times daily. 05/20/21   Antonieta Iba, MD  Coenzyme Q10 (COQ-10) 200 MG CAPS Take 200 mg by mouth daily.    [provider]  cyclobenzaprine (FLEXERIL) 10 MG tablet Take 1 tablet (10 mg total) by mouth 3 (three) times daily as needed for muscle spasms. 04/24/21   McElwee, Lauren A, NP  ezetimibe (ZETIA) 10 MG tablet Take 1 tablet (10 mg total) by mouth daily. 05/20/21   Antonieta Iba, MD  meloxicam (MOBIC) 15 MG tablet Take 1 tablet (15 mg total) by mouth daily as needed for pain. 04/24/21   McElwee, Lauren A, NP  nitroGLYCERIN (NITROSTAT) 0.4 MG SL tablet Place 1 tablet (0.4 mg total) under the tongue every 5 (five) minutes as needed for chest pain. No more than 3 doses. 05/20/21   Antonieta Iba, MD  rosuvastatin (CRESTOR) 5 MG tablet TAKE 1 TABLET DAILY 06/30/21   Antonieta Iba, MD  traZODone (DESYREL) 50 MG tablet Take 0.5-1 tablets (25-50 mg total) by mouth at bedtime as needed for sleep. 04/24/21   McElwee,  Lauren A, NP     Allergies Patient has no known allergies.  Family History  Problem Relation Age of Onset   Hypertension Mother    Hypertension Father    Arrhythmia Father        Pacemaker implant   Hypertension Brother    Arthritis Brother    Stroke Maternal Grandmother    Lung cancer Maternal Grandfather     Social History Social History   Tobacco Use   Smoking status: Former    Packs/day: 1.00    Types: Cigarettes    Quit date: 09/13/1988    Years since quitting: 32.9   Smokeless tobacco: Never  Vaping Use   Vaping Use: Never used  Substance Use Topics   Alcohol use: Yes     Alcohol/week: 0.0 standard drinks    Comment: Rarely   Drug use: No    Review of Systems  Constitutional: No fever/chills Eyes: No visual changes.  ENT: No sore throat. Cardiovascular: Denies chest pain. Respiratory: Denies shortness of breath. Gastrointestinal: As above.   Genitourinary: Negative for dysuria.  No hematuria Musculoskeletal: Negative for back pain. Skin: Negative for rash. Neurological: Negative for headaches or weakness   ____________________________________________   PHYSICAL EXAM:  VITAL SIGNS: ED Triage Vitals [07/31/21 0931]  Enc Vitals Group     BP (!) 143/94     Pulse Rate 77     Resp 16     Temp 98.4 F (36.9 C)     Temp Source Oral     SpO2 100 %     Weight 86.2 kg (190 lb 0.6 oz)     Height 1.753 m (5\' 9" )     Head Circumference      Peak Flow      Pain Score 3     Pain Loc      Pain Edu?      Excl. in GC?     Constitutional: Alert and oriented. No acute distress. Pleasant and interactive Eyes: Conjunctivae are normal.  Head: Atraumatic.  Cardiovascular: Normal rate, regular rhythm. Grossly normal heart sounds.  Good peripheral circulation. Respiratory: Normal respiratory effort.  No retractions. Lungs CTAB. Gastrointestinal: Soft, mild left lower quadrant tenderness to palpation.  No distention.  No CVA tenderness. Genitourinary: deferred Musculoskeletal: No lower extremity tenderness nor edema.  Warm and well perfused Neurologic:  Normal speech and language. No gross focal neurologic deficits are appreciated.  Skin:  Skin is warm, dry and intact. No rash noted. Psychiatric: Mood and affect are normal. Speech and behavior are normal.  ____________________________________________   LABS (all labs ordered are listed, but only abnormal results are displayed)  Labs Reviewed  COMPREHENSIVE METABOLIC PANEL - Abnormal; Notable for the following components:      Result Value   Glucose, Bld 211 (*)    Total Bilirubin 1.5 (*)    All  other components within normal limits  URINALYSIS, ROUTINE W REFLEX MICROSCOPIC - Abnormal; Notable for the following components:   Color, Urine YELLOW (*)    APPearance CLEAR (*)    Glucose, UA 150 (*)    All other components within normal limits  LIPASE, BLOOD  CBC   ____________________________________________  EKG  None ____________________________________________  RADIOLOGY  CT renal stone study reviewed by me, suspicious for diverticulitis, pending radiology review ____________________________________________   PROCEDURES  Procedure(s) performed: No  Procedures   Critical Care performed: No ____________________________________________   INITIAL IMPRESSION / ASSESSMENT AND PLAN / ED COURSE  Pertinent labs & imaging  results that were available during my care of the patient were reviewed by me and considered in my medical decision making (see chart for details).   Patient presents with left lower quad abdominal pain as detailed above, exam is suspicious for diverticulitis.  Differential also includes ureterolithiasis  Urinalysis is unremarkable.  White blood cell count is normal.  Mild elevation in glucose on chemistries.  Patient declined analgesics  Pending CT renal stone study  CT is consistent with acute uncomplicated diverticulitis, will start the patient on Augmentin, analgesics, laxative, return precautions discussed    ____________________________________________   FINAL CLINICAL IMPRESSION(S) / ED DIAGNOSES  Final diagnoses:  Diverticulitis of large intestine without perforation or abscess without bleeding        Note:  This document was prepared using Dragon voice recognition software and may include unintentional dictation errors.    Lavonia Drafts, MD 07/31/21 [phone]

## 2021-08-14 ENCOUNTER — Ambulatory Visit: Payer: Self-pay | Admitting: *Deleted

## 2021-08-14 NOTE — Telephone Encounter (Unsigned)
Reason for Disposition . MODERATE pain (e.g., interferes with normal activities or awakens from sleep)  Answer Assessment - Initial Assessment Questions 1. LOCATION: "Where does it hurt?" (e.g., left, right)     2 wks ago I went to ED due to pain left side.   CT scan diverticulitis.    I'm having pain in my right side and lower back and up to my rib cage a burning sensation now.    I can rub my back where it's tender on lower part and it's burning.    The bottom right side of my rib cage burns. 2. ONSET: "When did the pain start?"     2 wks ago with pain on left side.    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   I'm using ibuprofen which helps the burning.   3. SEVERITY: "How bad is the pain?" (e.g., Scale 1-10; mild, moderate, or severe)   - MILD (1-3): doesn't interfere with normal activities    - MODERATE (4-7): interferes with normal activities or awakens from sleep    - SEVERE (8-10): excruciating pain and patient unable to do normal activities (stays in bed)       5-6/10  When I lay down it eases off at night but during the day it hurts. 4. PATTERN: "Does the pain come and go, or is it constant?"      Constant during the day 5. CAUSE: "What do you think is causing the pain?"     I have no idea.   No injuries or accidents.    Maybe inflammation.   6. OTHER SYMPTOMS:  "Do you have any other symptoms?" (e.g., fever, abdominal pain, vomiting, leg weakness, burning with urination, blood in urine)     No problems with urinary symptoms.   They checked me for kidney stones in the ED and there is none.     7. PREGNANCY:  "Is there any chance you are pregnant?" "When was your last menstrual period?"     N/A  Protocols used: [facility] Pain-A-AH

## 2021-08-14 NOTE — Telephone Encounter (Signed)
Summary: sudden pain   Pt stated he has sudden back and side pain / he stated he has a lot of burning / please advise / pt is at work so please leave message if no answer    Attempted to call patient- left messgae to call office

## 2021-08-18 ENCOUNTER — Ambulatory Visit
Admission: RE | Admit: 2021-08-18 | Discharge: 2021-08-18 | Disposition: A | Discharge status: Home / Self Care | Payer: 59 | Source: Home / Self Care | Attending: Family Medicine | Admitting: Family Medicine

## 2021-08-18 ENCOUNTER — Ambulatory Visit: Payer: 59 | Admitting: Family Medicine

## 2021-08-18 ENCOUNTER — Encounter: Payer: Self-pay | Admitting: Family Medicine

## 2021-08-18 ENCOUNTER — Other Ambulatory Visit: Payer: Self-pay

## 2021-08-18 ENCOUNTER — Ambulatory Visit
Admission: RE | Admit: 2021-08-18 | Discharge: 2021-08-18 | Disposition: A | Discharge status: Home / Self Care | Payer: 59 | Source: Ambulatory Visit | Attending: Family Medicine | Admitting: Family Medicine

## 2021-08-18 VITALS — BP 139/90 | HR 69 | Temp 98.0°F | Wt 193.2 lb

## 2021-08-18 DIAGNOSIS — M546 Pain in thoracic spine: Secondary | ICD-10-CM | POA: Diagnosis present

## 2021-08-18 DIAGNOSIS — M4317 Spondylolisthesis, lumbosacral region: Secondary | ICD-10-CM

## 2021-08-18 DIAGNOSIS — M545 Low back pain, unspecified: Secondary | ICD-10-CM

## 2021-08-18 DIAGNOSIS — I700 Atherosclerosis of aorta: Secondary | ICD-10-CM | POA: Diagnosis not present

## 2021-08-18 LAB — URINALYSIS, ROUTINE W REFLEX MICROSCOPIC
Bilirubin, UA: NEGATIVE
Glucose, UA: NEGATIVE
Ketones, UA: NEGATIVE
Leukocytes,UA: NEGATIVE
Nitrite, UA: NEGATIVE
Protein,UA: NEGATIVE
RBC, UA: NEGATIVE
Specific Gravity, UA: 1.01 (ref 1.005–1.030)
Urobilinogen, Ur: 0.2 mg/dL (ref 0.2–1.0)
pH, UA: 6 (ref 5.0–7.5)

## 2021-08-18 MED ORDER — CYCLOBENZAPRINE HCL 10 MG PO TABS
10.0000 mg | ORAL_TABLET | Freq: Three times a day (TID) | ORAL | 0 refills | Status: DC | PRN
Start: 2021-08-18 — End: 2021-12-07

## 2021-08-18 MED ORDER — KETOROLAC TROMETHAMINE 60 MG/2ML IM SOLN
60.0000 mg | Freq: Once | INTRAMUSCULAR | Status: AC
Start: 2021-08-18 — End: 2021-08-18
  Administered 2021-08-18: 60 mg via INTRAMUSCULAR

## 2021-08-18 MED ORDER — GABAPENTIN 100 MG PO CAPS: 100.0000 mg | ORAL_CAPSULE | Freq: Three times a day (TID) | ORAL | 3 refills | Status: DC

## 2021-08-18 NOTE — Assessment & Plan Note (Signed)
See discussion under low back pain.

## 2021-08-18 NOTE — Assessment & Plan Note (Signed)
Will keep BP and cholesterol under good control. Continue to monitor. Call with any concerns.  

## 2021-08-18 NOTE — Progress Notes (Signed)
BP 139/90   Pulse 69   Temp 98 F (36.7 C)   Wt 193 lb 3.2 oz (87.6 kg)   SpO2 99%   BMI 28.53 kg/m    Subjective:    Patient ID: Jeff Fields, male    DOB: April 08, 1961, 60 y.o.   MRN: [id number]  HPI: Jeff Fields is a 60 y.o. male  Chief Complaint  Patient presents with   Back Pain    Patient states he is having back pain that feels like a burning pain. Pain is in lower back and radiates to middle back up to between his shoulders. Patient states he is having right side pain that feels like a burning pain as well.    BACK PAIN Duration:  2-3 weeks Mechanism of injury: unknown Location: bilateral and low back Onset: sudden Severity: moderate Quality: burning Frequency: constant Radiation: up into his neck and into his side Aggravating factors: movement Alleviating factors: laying down Status: stable Treatments attempted: antibiotics for diverticulits  Relief with NSAIDs?: mild Nighttime pain:  no Paresthesias / decreased sensation:  no Bowel / bladder incontinence:  no Fevers:  no Dysuria / urinary frequency:  no  Relevant past medical, surgical, family and social history reviewed and updated as indicated. Interim medical history since our last visit reviewed. Allergies and medications reviewed and updated.  Review of Systems  Constitutional: Negative.   Respiratory: Negative.    Cardiovascular: Negative.   Musculoskeletal:  Positive for back pain and myalgias. Negative for arthralgias, gait problem, joint swelling, neck pain and neck stiffness.  Skin: Negative.   Neurological: Negative.   Psychiatric/Behavioral: Negative.     Per HPI unless specifically indicated above     Objective:    BP 139/90   Pulse 69   Temp 98 F (36.7 C)   Wt 193 lb 3.2 oz (87.6 kg)   SpO2 99%   BMI 28.53 kg/m   Wt Readings from Last 3 Encounters:  08/18/21 193 lb 3.2 oz (87.6 kg)  07/31/21 190 lb 0.6 oz (86.2 kg)  05/20/21 190 lb (86.2 kg)    Physical Exam Vitals and  nursing note reviewed.  Constitutional:      General: He is not in acute distress.    Appearance: Normal appearance. He is not ill-appearing, toxic-appearing or diaphoretic.  HENT:     Head: Normocephalic and atraumatic.     Right Ear: External ear normal.     Left Ear: External ear normal.     Nose: Nose normal.     Mouth/Throat:     Mouth: Mucous membranes are moist.     Pharynx: Oropharynx is clear.  Eyes:     General: No scleral icterus.       Right eye: No discharge.        Left eye: No discharge.     Extraocular Movements: Extraocular movements intact.     Conjunctiva/sclera: Conjunctivae normal.     Pupils: Pupils are equal, round, and reactive to light.  Cardiovascular:     Rate and Rhythm: Normal rate and regular rhythm.     Pulses: Normal pulses.     Heart sounds: Normal heart sounds. No murmur heard.   No friction rub. No gallop.  Pulmonary:     Effort: Pulmonary effort is normal. No respiratory distress.     Breath sounds: Normal breath sounds. No stridor. No wheezing, rhonchi or rales.  Chest:     Chest wall: No tenderness.  Musculoskeletal:  General: Tenderness present.     Cervical back: Normal range of motion and neck supple.  Skin:    General: Skin is warm and dry.     Capillary Refill: Capillary refill takes less than 2 seconds.     Coloration: Skin is not jaundiced or pale.     Findings: No bruising, erythema, lesion or rash.  Neurological:     General: No focal deficit present.     Mental Status: He is alert and oriented to person, place, and time. Mental status is at baseline.  Psychiatric:        Mood and Affect: Mood normal.        Behavior: Behavior normal.        Thought Content: Thought content normal.        Judgment: Judgment normal.    Results for orders placed or performed during the hospital encounter of 07/31/21  Lipase, blood  Result Value Ref Range   Lipase 38 11 - 51 U/L  Comprehensive metabolic panel  Result Value Ref Range    Sodium 137 135 - 145 mmol/L   Potassium 4.0 3.5 - 5.1 mmol/L   Chloride 103 98 - 111 mmol/L   CO2 27 22 - 32 mmol/L   Glucose, Bld 211 (H) 70 - 99 mg/dL   BUN 13 6 - 20 mg/dL   Creatinine, Ser 0.35 0.61 - 1.24 mg/dL   Calcium 9.3 8.9 - 59.7 mg/dL   Total Protein 7.2 6.5 - 8.1 g/dL   Albumin 4.5 3.5 - 5.0 g/dL   AST 19 15 - 41 U/L   ALT 18 0 - 44 U/L   Alkaline Phosphatase 65 38 - 126 U/L   Total Bilirubin 1.5 (H) 0.3 - 1.2 mg/dL   GFR, Estimated >41 >63 mL/min   Anion gap 7 5 - 15  CBC  Result Value Ref Range   WBC 9.7 4.0 - 10.5 K/uL   RBC 4.33 4.22 - 5.81 MIL/uL   Hemoglobin 14.2 13.0 - 17.0 g/dL   HCT 84.5 36.4 - 68.0 %   MCV 92.1 80.0 - 100.0 fL   MCH 32.8 26.0 - 34.0 pg   MCHC 35.6 30.0 - 36.0 g/dL   RDW 32.1 22.4 - 82.5 %   Platelets 186 150 - 400 K/uL   nRBC 0.0 0.0 - 0.2 %  Urinalysis, Routine w reflex microscopic  Result Value Ref Range   Color, Urine YELLOW (A) YELLOW   APPearance CLEAR (A) CLEAR   Specific Gravity, Urine 1.008 1.005 - 1.030   pH 6.0 5.0 - 8.0   Glucose, UA 150 (A) NEGATIVE mg/dL   Hgb urine dipstick NEGATIVE NEGATIVE   Bilirubin Urine NEGATIVE NEGATIVE   Ketones, ur NEGATIVE NEGATIVE mg/dL   Protein, ur NEGATIVE NEGATIVE mg/dL   Nitrite NEGATIVE NEGATIVE   Leukocytes,Ua NEGATIVE NEGATIVE      Assessment & Plan:   Problem List Items Addressed This Visit       Cardiovascular and Mediastinum   Aortic atherosclerosis (HCC)    Will keep BP and cholesterol under good control. Continue to monitor. Call with any concerns.         Musculoskeletal and Integument   Anterolisthesis of lumbosacral spine    See discussion under low back pain.        Other   Low back pain - Primary   Relevant Medications   ketorolac (TORADOL) injection 60 mg   cyclobenzaprine (FLEXERIL) 10 MG tablet   Other Relevant Orders  Urinalysis, Routine w reflex microscopic   Other Visit Diagnoses     Acute right-sided thoracic back pain       Will check  thoracic x-ray. Await results. Treat as needed.    Relevant Medications   ketorolac (TORADOL) injection 60 mg   cyclobenzaprine (FLEXERIL) 10 MG tablet   Other Relevant Orders   DG Thoracic Spine W/Swimmers        Follow up plan: Return in about 2 weeks (around 09/01/2021).   >30 minutes spent with patient today

## 2021-08-21 ENCOUNTER — Other Ambulatory Visit: Payer: Self-pay | Admitting: Family Medicine

## 2021-08-21 DIAGNOSIS — M5134 Other intervertebral disc degeneration, thoracic region: Secondary | ICD-10-CM

## 2021-08-23 ENCOUNTER — Other Ambulatory Visit: Payer: Self-pay | Admitting: Nurse Practitioner

## 2021-08-23 NOTE — Telephone Encounter (Signed)
Requested Prescriptions  Pending Prescriptions Disp Refills  . traZODone (DESYREL) 50 MG tablet [Pharmacy Med Name: traZODone HCl 50 MG Oral Tablet] 30 tablet 0    Sig: TAKE 1/2 TO 1 (ONE-HALF TO ONE) TABLET BY MOUTH AT BEDTIME AS NEEDED FOR SLEEP     Psychiatry: Antidepressants - Serotonin Modulator Passed - 08/23/2021 12:23 PM      Passed - Valid encounter within last 6 months    Recent Outpatient Visits          5 days ago Acute low back pain, unspecified back pain laterality, unspecified whether sciatica present   [facility], Megan P, DO   4 months ago Acute midline low back pain with right-sided sciatica   [facility] [place], Jake Church, NP   1 year ago COVID-19   [facility] [place], [place], DO   1 year ago Abdominal pain, left lower quadrant   [facility] Valentino Nose, NP   2 years ago Acute right-sided low back pain without sciatica   [facility] [place], Oralia Rud, DO      Future Appointments            In 1 week Laural Benes, Oralia Rud, DO [employer] [facility]

## 2021-08-26 ENCOUNTER — Encounter: Payer: Self-pay | Admitting: Family Medicine

## 2021-08-28 MED ORDER — GABAPENTIN 100 MG PO CAPS: 200.0000 mg | ORAL_CAPSULE | Freq: Three times a day (TID) | ORAL | 3 refills | Status: DC

## 2021-09-01 ENCOUNTER — Other Ambulatory Visit: Payer: Self-pay

## 2021-09-01 ENCOUNTER — Encounter: Payer: Self-pay | Admitting: Family Medicine

## 2021-09-01 ENCOUNTER — Ambulatory Visit: Payer: 59 | Admitting: Family Medicine

## 2021-09-01 VITALS — BP 153/94 | HR 64 | Temp 98.6°F | Ht 69.02 in | Wt 200.6 lb

## 2021-09-01 DIAGNOSIS — Z1211 Encounter for screening for malignant neoplasm of colon: Secondary | ICD-10-CM

## 2021-09-01 DIAGNOSIS — M5134 Other intervertebral disc degeneration, thoracic region: Secondary | ICD-10-CM | POA: Diagnosis not present

## 2021-09-01 MED ORDER — GABAPENTIN 300 MG PO CAPS: 300.0000 mg | ORAL_CAPSULE | Freq: Three times a day (TID) | ORAL | 1 refills | Status: DC

## 2021-09-01 NOTE — Progress Notes (Signed)
BP (!) 153/94    Pulse 64    Temp 98.6 F (37 C) (Oral)    Ht 5' 9.02" (1.753 m)    Wt 200 lb 9.6 oz (91 kg)    SpO2 99%    BMI 29.61 kg/m    Subjective:    Patient ID: Jeff Fields, male    DOB: 1961/01/17, 60 y.o.   MRN: [id number]  HPI: Jeff Fields is a 60 y.o. male  Chief Complaint  Patient presents with   Back Pain   BACK PAIN Duration: 4-5 weeks Mechanism of injury: unknown Location: bilateral R>L upper back and low back Onset: sudden Severity: mild to moderate Quality: burning Frequency: intermittant Radiation: into his R side Aggravating factors: moving, doing anything Alleviating factors: laying down and medicine Status: better Treatments attempted: gabapentin, pain meds, toradol  Relief with NSAIDs?: mild Nighttime pain:  no Paresthesias / decreased sensation:  no Bowel / bladder incontinence:  no Fevers:  no Dysuria / urinary frequency:  no  Relevant past medical, surgical, family and social history reviewed and updated as indicated. Interim medical history since our last visit reviewed. Allergies and medications reviewed and updated.  Review of Systems  Constitutional: Negative.   Respiratory: Negative.    Cardiovascular: Negative.   Musculoskeletal:  Positive for back pain and myalgias. Negative for arthralgias, gait problem, joint swelling, neck pain and neck stiffness.  Skin: Negative.   Neurological: Negative.   Psychiatric/Behavioral: Negative.     Per HPI unless specifically indicated above     Objective:    BP (!) 153/94    Pulse 64    Temp 98.6 F (37 C) (Oral)    Ht 5' 9.02" (1.753 m)    Wt 200 lb 9.6 oz (91 kg)    SpO2 99%    BMI 29.61 kg/m   Wt Readings from Last 3 Encounters:  09/01/21 200 lb 9.6 oz (91 kg)  08/18/21 193 lb 3.2 oz (87.6 kg)  07/31/21 190 lb 0.6 oz (86.2 kg)    Physical Exam Vitals and nursing note reviewed.  Constitutional:      General: He is not in acute distress.    Appearance: Normal appearance. He is not  ill-appearing, toxic-appearing or diaphoretic.  HENT:     Head: Normocephalic and atraumatic.     Right Ear: External ear normal.     Left Ear: External ear normal.     Nose: Nose normal.     Mouth/Throat:     Mouth: Mucous membranes are moist.     Pharynx: Oropharynx is clear.  Eyes:     General: No scleral icterus.       Right eye: No discharge.        Left eye: No discharge.     Extraocular Movements: Extraocular movements intact.     Conjunctiva/sclera: Conjunctivae normal.     Pupils: Pupils are equal, round, and reactive to light.  Cardiovascular:     Rate and Rhythm: Normal rate and regular rhythm.     Pulses: Normal pulses.     Heart sounds: Normal heart sounds. No murmur heard.   No friction rub. No gallop.  Pulmonary:     Effort: Pulmonary effort is normal. No respiratory distress.     Breath sounds: Normal breath sounds. No stridor. No wheezing, rhonchi or rales.  Chest:     Chest wall: No tenderness.  Musculoskeletal:        General: Normal range of motion.  Cervical back: Normal range of motion and neck supple.  Skin:    General: Skin is warm and dry.     Capillary Refill: Capillary refill takes less than 2 seconds.     Coloration: Skin is not jaundiced or pale.     Findings: No bruising, erythema, lesion or rash.  Neurological:     General: No focal deficit present.     Mental Status: He is alert and oriented to person, place, and time. Mental status is at baseline.  Psychiatric:        Mood and Affect: Mood normal.        Behavior: Behavior normal.        Thought Content: Thought content normal.        Judgment: Judgment normal.    Results for orders placed or performed in visit on 08/18/21  Urinalysis, Routine w reflex microscopic  Result Value Ref Range   Specific Gravity, UA 1.010 1.005 - 1.030   pH, UA 6.0 5.0 - 7.5   Color, UA Yellow Yellow   Appearance Ur Clear Clear   Leukocytes,UA Negative Negative   Protein,UA Negative Negative/Trace    Glucose, UA Negative Negative   Ketones, UA Negative Negative   RBC, UA Negative Negative   Bilirubin, UA Negative Negative   Urobilinogen, Ur 0.2 0.2 - 1.0 mg/dL   Nitrite, UA Negative Negative      Assessment & Plan:   Problem List Items Addressed This Visit   None Visit Diagnoses     Other intervertebral disc degeneration, thoracic region    -  Primary   MRI was denied. Declines PT at this time. Will increase his gabapentin to 300mg  TID and get him into ortho. Call with any concerns. Continue to monitor.    Relevant Orders   Ambulatory referral to Orthopedic Surgery   Screening for colon cancer       Referral placed today.   Relevant Orders   Ambulatory referral to Gastroenterology        Follow up plan: Return in about 3 months (around 11/30/2021), or physical.

## 2021-09-10 ENCOUNTER — Other Ambulatory Visit: Payer: Self-pay

## 2021-09-10 DIAGNOSIS — Z1211 Encounter for screening for malignant neoplasm of colon: Secondary | ICD-10-CM

## 2021-09-10 MED ORDER — NA SULFATE-K SULFATE-MG SULF 17.5-3.13-1.6 GM/177ML PO SOLN
1.0000 | Freq: Once | ORAL | 0 refills | Status: AC
Start: 2021-09-10 — End: 2021-09-10

## 2021-09-10 NOTE — Progress Notes (Signed)
Gastroenterology Pre-Procedure Review  Request Date: 10/09/2021 Requesting Physician: Dr. Tobi Bastos  PATIENT REVIEW QUESTIONS: The patient responded to the following health history questions as indicated:    1. Are you having any GI issues?  Diverticulitis 2. Do you have a personal history of Polyps? no 3. Do you have a family history of Colon Cancer or Polyps? no 4. Diabetes Mellitus? no 5. Joint replacements in the past 12 months?no 6. Major health problems in the past 3 months?yes (Lower back and side pain) 7. Any artificial heart valves, MVP, or defibrillator?no    MEDICATIONS & ALLERGIES:    Patient reports the following regarding taking any anticoagulation/antiplatelet therapy:   Plavix, Coumadin, Eliquis, Xarelto, Lovenox, Pradaxa, Brilinta, or Effient? no Aspirin? yes (81 mg)  Patient confirms/reports the following medications:  Current Outpatient Medications  Medication Sig Dispense Refill   amLODipine (NORVASC) 10 MG tablet Take 0.5 tablets (5 mg total) by mouth daily. 45 tablet 3   aspirin 81 MG tablet Take 81 mg by mouth daily.     carvedilol (COREG) 25 MG tablet Take 1 tablet (25 mg total) by mouth 2 (two) times daily. 180 tablet 3   Coenzyme Q10 (COQ-10) 200 MG CAPS Take 200 mg by mouth daily.     cyclobenzaprine (FLEXERIL) 10 MG tablet Take 1 tablet (10 mg total) by mouth 3 (three) times daily as needed for muscle spasms. 30 tablet 0   ezetimibe (ZETIA) 10 MG tablet Take 1 tablet (10 mg total) by mouth daily. 90 tablet 3   gabapentin (NEURONTIN) 300 MG capsule Take 1 capsule (300 mg total) by mouth 3 (three) times daily. 90 capsule 1   HYDROcodone-acetaminophen (NORCO/VICODIN) 5-325 MG tablet Take 1 tablet by mouth every 6 (six) hours as needed for severe pain. (Patient not taking: Reported on 08/18/2021) 15 tablet 0   meloxicam (MOBIC) 15 MG tablet Take 1 tablet (15 mg total) by mouth daily as needed for pain. (Patient not taking: Reported on 08/18/2021) 30 tablet 0    nitroGLYCERIN (NITROSTAT) 0.4 MG SL tablet Place 1 tablet (0.4 mg total) under the tongue every 5 (five) minutes as needed for chest pain. No more than 3 doses. (Patient not taking: Reported on 09/01/2021) 25 tablet 3   rosuvastatin (CRESTOR) 5 MG tablet TAKE 1 TABLET DAILY 90 tablet 3   traZODone (DESYREL) 50 MG tablet TAKE 1/2 TO 1 (ONE-HALF TO ONE) TABLET BY MOUTH AT BEDTIME AS NEEDED FOR SLEEP 30 tablet 0   No current facility-administered medications for this visit.    Patient confirms/reports the following allergies:  No Known Allergies  No orders of the defined types were placed in this encounter.   AUTHORIZATION INFORMATION Primary Insurance: 1D#: Group #:  Secondary Insurance: 1D#: Group #:  SCHEDULE INFORMATION: Date: 10/09/2021 Time: Location: [facility]

## 2021-09-14 ENCOUNTER — Other Ambulatory Visit: Payer: Self-pay | Admitting: Nurse Practitioner

## 2021-09-15 NOTE — Telephone Encounter (Signed)
Requested medications are due for refill today.  Unsure  Requested medications are on the active medications list.  yes  Last refill. 04/24/2021  Future visit scheduled.   yes  Notes to clinic.  Per note of 08/18/2021, pt is no longer taking this medication.    Requested Prescriptions  Pending Prescriptions Disp Refills   meloxicam (MOBIC) 15 MG tablet [Pharmacy Med Name: Meloxicam 15 MG Oral Tablet] 30 tablet 0    Sig: TAKE 1 TABLET BY MOUTH ONCE DAILY AS NEEDED FOR PAIN     Analgesics:  COX2 Inhibitors Passed - 09/14/2021  9:08 AM      Passed - HGB in normal range and within 360 days    Hemoglobin  Date Value Ref Range Status  07/31/2021 14.2 13.0 - 17.0 g/dL Final  27/02/2375 28.3 13.0 - 17.7 g/dL Final          Passed - Cr in normal range and within 360 days    Creatinine, Ser  Date Value Ref Range Status  07/31/2021 1.08 0.61 - 1.24 mg/dL Final          Passed - Patient is not pregnant      Passed - Valid encounter within last 12 months    Recent Outpatient Visits           2 weeks ago Other intervertebral disc degeneration, thoracic region   [facility], Megan P, DO   4 weeks ago Acute low back pain, unspecified back pain laterality, unspecified whether sciatica present   [facility], Megan P, DO   4 months ago Acute midline low back pain with right-sided sciatica   [facility] [place], Jake Church, NP   1 year ago COVID-19   [facility] [place], [place], DO   1 year ago Abdominal pain, left lower quadrant   [facility] Valentino Nose, NP       Future Appointments             In 2 months Laural Benes, Oralia Rud, DO [employer] [facility]

## 2021-09-18 ENCOUNTER — Other Ambulatory Visit: Payer: Self-pay | Admitting: Nurse Practitioner

## 2021-09-18 NOTE — Telephone Encounter (Signed)
Requested medication (s) are due for refill today: yes  Requested medication (s) are on the active medication list: yes  Last refill:  04/24/21 #30 0 refills  Future visit scheduled: yes in 2 months  Notes to clinic:  pharmacy recommends patient contact provider. Do you want to refill?     Requested Prescriptions  Pending Prescriptions Disp Refills   meloxicam (MOBIC) 15 MG tablet [Pharmacy Med Name: Meloxicam 15 MG Oral Tablet] 30 tablet 0    Sig: TAKE 1 TABLET BY MOUTH ONCE DAILY AS NEEDED FOR PAIN     Analgesics:  COX2 Inhibitors Passed - 09/18/2021  9:22 AM      Passed - HGB in normal range and within 360 days    Hemoglobin  Date Value Ref Range Status  07/31/2021 14.2 13.0 - 17.0 g/dL Final  88/41/6606 30.1 13.0 - 17.7 g/dL Final          Passed - Cr in normal range and within 360 days    Creatinine, Ser  Date Value Ref Range Status  07/31/2021 1.08 0.61 - 1.24 mg/dL Final          Passed - Patient is not pregnant      Passed - Valid encounter within last 12 months    Recent Outpatient Visits           2 weeks ago Other intervertebral disc degeneration, thoracic region   [facility], Megan P, DO   1 month ago Acute low back pain, unspecified back pain laterality, unspecified whether sciatica present   [facility], Megan P, DO   4 months ago Acute midline low back pain with right-sided sciatica   [facility] [place], Jake Church, NP   1 year ago COVID-19   [facility] [place], [place], DO   1 year ago Abdominal pain, left lower quadrant   [facility] Valentino Nose, NP       Future Appointments             In 2 months Laural Benes, Oralia Rud, DO [employer] [facility]

## 2021-10-09 ENCOUNTER — Encounter: Payer: Self-pay | Admitting: Gastroenterology

## 2021-10-09 ENCOUNTER — Ambulatory Visit
Admission: RE | Admit: 2021-10-09 | Discharge: 2021-10-09 | Disposition: A | Discharge status: Home / Self Care | Payer: 59 | Attending: Gastroenterology | Admitting: Gastroenterology

## 2021-10-09 ENCOUNTER — Ambulatory Visit: Payer: 59 | Admitting: Certified Registered"

## 2021-10-09 ENCOUNTER — Encounter
Admission: RE | Disposition: A | Discharge status: Home / Self Care | Payer: Self-pay | Source: Home / Self Care | Attending: Gastroenterology

## 2021-10-09 DIAGNOSIS — K5730 Diverticulosis of large intestine without perforation or abscess without bleeding: Secondary | ICD-10-CM | POA: Diagnosis not present

## 2021-10-09 DIAGNOSIS — I10 Essential (primary) hypertension: Secondary | ICD-10-CM | POA: Diagnosis not present

## 2021-10-09 DIAGNOSIS — K640 First degree hemorrhoids: Secondary | ICD-10-CM | POA: Diagnosis not present

## 2021-10-09 DIAGNOSIS — I2510 Atherosclerotic heart disease of native coronary artery without angina pectoris: Secondary | ICD-10-CM | POA: Diagnosis not present

## 2021-10-09 DIAGNOSIS — Z87891 Personal history of nicotine dependence: Secondary | ICD-10-CM | POA: Diagnosis not present

## 2021-10-09 DIAGNOSIS — Z955 Presence of coronary angioplasty implant and graft: Secondary | ICD-10-CM | POA: Diagnosis not present

## 2021-10-09 DIAGNOSIS — Z1211 Encounter for screening for malignant neoplasm of colon: Secondary | ICD-10-CM | POA: Diagnosis present

## 2021-10-09 HISTORY — PX: COLONOSCOPY WITH PROPOFOL: SHX5780

## 2021-10-09 SURGERY — COLONOSCOPY WITH PROPOFOL
Anesthesia: General

## 2021-10-09 MED ORDER — PROPOFOL 500 MG/50ML IV EMUL
INTRAVENOUS | Status: DC | PRN
  Administered 2021-10-09: 135 ug/kg/min via INTRAVENOUS

## 2021-10-09 MED ORDER — SODIUM CHLORIDE 0.9 % IV SOLN
INTRAVENOUS | Status: DC
  Administered 2021-10-09: 20 mL/h via INTRAVENOUS

## 2021-10-09 MED ORDER — PROPOFOL 500 MG/50ML IV EMUL
INTRAVENOUS | Status: AC
  Filled 2021-10-09: qty 50

## 2021-10-09 MED ORDER — PROPOFOL 10 MG/ML IV BOLUS
INTRAVENOUS | Status: DC | PRN
  Administered 2021-10-09: 100 mg via INTRAVENOUS

## 2021-10-09 MED ORDER — LIDOCAINE HCL (CARDIAC) PF 100 MG/5ML IV SOSY
PREFILLED_SYRINGE | INTRAVENOUS | Status: DC | PRN
  Administered 2021-10-09: 100 mg via INTRAVENOUS

## 2021-10-09 NOTE — Transfer of Care (Signed)
Immediate Anesthesia Transfer of Care Note  Patient: Jeff Fields  Procedure(s) Performed: COLONOSCOPY WITH PROPOFOL  Patient Location: PACU  Anesthesia Type:General  Level of Consciousness: drowsy  Airway & Oxygen Therapy: Patient Spontanous Breathing  Post-op Assessment: Report given to RN  Post vital signs: stable  Last Vitals:  Vitals Value Taken Time  BP    Temp    Pulse    Resp    SpO2      Last Pain:  Vitals:   10/09/21 1025  TempSrc: Temporal  PainSc: 3          Complications: No notable events documented.

## 2021-10-09 NOTE — H&P (Signed)
Wyline Mood, MD [address], [place], [place], [address] [address], [address], [place], [place], [address] Phone: [phone]  Fax: [phone]  Primary Care Physician:  Dorcas Carrow, DO   Pre-Procedure History & Physical: HPI:  Jeff Fields is a 61 y.o. male is here for an colonoscopy.   Past Medical History:  Diagnosis Date   Coronary artery disease    a. LHC 10/2014: mid LAD 99% stenosis s/p PCI/DES, 40% stenosis in the proximal LAD, D1 40% stenosis at the ostium of the vessel, EF > 55%   Hyperlipidemia    Hypertension     Past Surgical History:  Procedure Laterality Date   CARDIAC CATHETERIZATION  11/08/2014   CORONARY ANGIOPLASTY WITH STENT PLACEMENT  11/08/2014   drug eluting stent placement to the mid LAD   VASECTOMY  2000    Prior to Admission medications   Medication Sig Start Date End Date Taking? Authorizing Provider  amLODipine (NORVASC) 10 MG tablet Take 0.5 tablets (5 mg total) by mouth daily. 05/20/21  Yes Antonieta Iba, MD  aspirin 81 MG tablet Take 81 mg by mouth daily.   Yes [provider]  Coenzyme Q10 (COQ-10) 200 MG CAPS Take 200 mg by mouth daily.   Yes [provider]  cyclobenzaprine (FLEXERIL) 10 MG tablet Take 1 tablet (10 mg total) by mouth 3 (three) times daily as needed for muscle spasms. 08/18/21  Yes Johnson, Megan P, DO  ezetimibe (ZETIA) 10 MG tablet Take 1 tablet (10 mg total) by mouth daily. 05/20/21  Yes Gollan, Tollie Pizza, MD  gabapentin (NEURONTIN) 300 MG capsule Take 1 capsule (300 mg total) by mouth 3 (three) times daily. 09/01/21  Yes Johnson, Megan P, DO  meloxicam (MOBIC) 15 MG tablet TAKE 1 TABLET BY MOUTH ONCE DAILY AS NEEDED FOR PAIN 09/18/21  Yes Johnson, Megan P, DO  rosuvastatin (CRESTOR) 5 MG tablet TAKE 1 TABLET DAILY 06/30/21  Yes Gollan, Tollie Pizza, MD  traZODone (DESYREL) 50 MG tablet TAKE 1/2 TO 1 (ONE-HALF TO ONE) TABLET BY MOUTH AT BEDTIME AS NEEDED FOR SLEEP 08/23/21  Yes Johnson,  Megan P, DO  carvedilol (COREG) 25 MG tablet Take 1 tablet (25 mg total) by mouth 2 (two) times daily. 05/20/21   Antonieta Iba, MD  HYDROcodone-acetaminophen (NORCO/VICODIN) 5-325 MG tablet Take 1 tablet by mouth every 6 (six) hours as needed for severe pain. Patient not taking: Reported on 08/18/2021 07/31/21   Jene Every, MD  nitroGLYCERIN (NITROSTAT) 0.4 MG SL tablet Place 1 tablet (0.4 mg total) under the tongue every 5 (five) minutes as needed for chest pain. No more than 3 doses. Patient not taking: Reported on 09/01/2021 05/20/21   Antonieta Iba, MD    Allergies as of 09/10/2021   (No Known Allergies)    Family History  Problem Relation Age of Onset   Hypertension Mother    Hypertension Father    Arrhythmia Father        Pacemaker implant   Hypertension Brother    Arthritis Brother    Stroke Maternal Grandmother    Lung cancer Maternal Grandfather     Social History   Socioeconomic History   Marital status: Married    Spouse name: Not on file   Number of children: Not on file   Years of education: Not on file   Highest education level: Not on file  Occupational History   Not on file  Tobacco Use  Smoking status: Former    Packs/day: 1.00    Types: Cigarettes    Quit date: 09/13/1988    Years since quitting: 33.0   Smokeless tobacco: Never  Vaping Use   Vaping Use: Never used  Substance and Sexual Activity   Alcohol use: Yes    Alcohol/week: 0.0 standard drinks    Comment: Rarely   Drug use: No   Sexual activity: Yes  Other Topics Concern   Not on file  Social History Narrative   Not on file   Social Determinants of Health   Financial Resource Strain: Not on file  Food Insecurity: Not on file  Transportation Needs: Not on file  Physical Activity: Not on file  Stress: Not on file  Social Connections: Not on file  Intimate Partner Violence: Not on file    Review of Systems: See HPI, otherwise negative ROS  Physical Exam: BP (!) 148/118     Pulse 72    Temp (!) 96.9 F (36.1 C) (Temporal)    Resp 20    Ht 5\' 10"  (1.778 m)    Wt 89.8 kg    SpO2 100%    BMI 28.41 kg/m  General:   Alert,  pleasant and cooperative in NAD Head:  Normocephalic and atraumatic. Neck:  Supple; no masses or thyromegaly. Lungs:  Clear throughout to auscultation, normal respiratory effort.    Heart:  +S1, +S2, Regular rate and rhythm, No edema. Abdomen:  Soft, nontender and nondistended. Normal bowel sounds, without guarding, and without rebound.   Neurologic:  Alert and  oriented x4;  grossly normal neurologically.  Impression/Plan: Jeff Fields is here for an colonoscopy to be performed for Screening colonoscopy average risk   Risks, benefits, limitations, and alternatives regarding  colonoscopy have been reviewed with the patient.  Questions have been answered.  All parties agreeable.   Noemi Chapel, MD  10/09/2021, 10:31 AM

## 2021-10-09 NOTE — Anesthesia Postprocedure Evaluation (Signed)
Anesthesia Post Note  Patient: Jeff Fields  Procedure(s) Performed: COLONOSCOPY WITH PROPOFOL  Patient location during evaluation: PACU Anesthesia Type: General Level of consciousness: awake and alert Pain management: pain level controlled Vital Signs Assessment: post-procedure vital signs reviewed and stable Respiratory status: spontaneous breathing, nonlabored ventilation and respiratory function stable Cardiovascular status: blood pressure returned to baseline and stable Postop Assessment: no apparent nausea or vomiting Anesthetic complications: no   No notable events documented.   Last Vitals:  Vitals:   10/09/21 1133 10/09/21 1143  BP: 127/82 130/83  Pulse: 65 (!) 57  Resp:  (!) 9  Temp:    SpO2: 99% 100%    Last Pain:  Vitals:   10/09/21 1143  TempSrc:   PainSc: 0-No pain                 Foye Deer

## 2021-10-09 NOTE — Op Note (Signed)
[facility] Gastroenterology Patient Name: Pericles Carmicheal Procedure Date: 10/09/2021 10:29 AM MRN: [id number] Account #: [id number] Date of Birth: 04/04/1961 Admit Type: Outpatient Age: 61 Room: [facility] ENDO ROOM 4 Gender: Male Note Status: Finalized Instrument Name: Prentice Docker [id number] Procedure:             Colonoscopy Indications:           Screening for colorectal malignant neoplasm Providers:             Wyline Mood MD, MD Medicines:             Monitored Anesthesia Care Complications:         No immediate complications. Procedure:             Pre-Anesthesia Assessment:                        - Prior to the procedure, a History and Physical was                         performed, and patient medications, allergies and                         sensitivities were reviewed. The patient's tolerance                         of previous anesthesia was reviewed.                        - The risks and benefits of the procedure and the                         sedation options and risks were discussed with the                         patient. All questions were answered and informed                         consent was obtained.                        - ASA Grade Assessment: II - A patient with mild                         systemic disease.                        After obtaining informed consent, the colonoscope was                         passed under direct vision. Throughout the procedure,                         the patient's blood pressure, pulse, and oxygen                         saturations were monitored continuously. The                         Colonoscope was introduced through the anus and  advanced to the the cecum, identified by the                         appendiceal orifice. The colonoscopy was performed                         with ease. The patient tolerated the procedure well.                         The quality of the bowel preparation was  adequate. Findings:      The perianal and digital rectal examinations were normal.      Multiple small-mouthed diverticula were found in the sigmoid colon.      Non-bleeding internal hemorrhoids were found during retroflexion. The       hemorrhoids were medium-sized and Grade I (internal hemorrhoids that do       not prolapse).      The exam was otherwise without abnormality on direct and retroflexion       views. Impression:            - Diverticulosis in the sigmoid colon.                        - Non-bleeding internal hemorrhoids.                        - The examination was otherwise normal on direct and                         retroflexion views.                        - No specimens collected. Recommendation:        - Discharge patient to home (with escort).                        - Resume previous diet.                        - Continue present medications.                        - Repeat colonoscopy in 10 years for screening                         purposes. Procedure Code(s):     --- Professional ---                        [phone], Colonoscopy, flexible; diagnostic, including                         collection of specimen(s) by brushing or washing, when                         performed (separate procedure) Diagnosis Code(s):     --- Professional ---                        Z12.11, Encounter for screening for malignant neoplasm  of colon                        K64.0, First degree hemorrhoids                        K57.30, Diverticulosis of large intestine without                         perforation or abscess without bleeding CPT copyright 2019 American Medical Association. All rights reserved. The codes documented in this report are preliminary and upon coder review may  be revised to meet current compliance requirements. Wyline MoodKiran Brissa Asante, MD Wyline MoodKiran Amala Petion MD, MD 10/09/2021 11:12:51 AM This report has been signed electronically. Number of Addenda: 0 Note  Initiated On: 10/09/2021 10:29 AM Scope Withdrawal Time: 0 hours 10 minutes 48 seconds  Total Procedure Duration: 0 hours 13 minutes 25 seconds  Estimated Blood Loss:  Estimated blood loss: none.      [facility]

## 2021-10-09 NOTE — Anesthesia Preprocedure Evaluation (Signed)
Anesthesia Evaluation  Patient identified by MRN, date of birth, ID band Patient awake    Reviewed: Allergy & Precautions, NPO status , Patient's Chart, lab work & pertinent test results  Airway Mallampati: I  TM Distance: >3 FB Neck ROM: full    Dental no notable dental hx.    Pulmonary neg pulmonary ROS, former smoker,    Pulmonary exam normal        Cardiovascular Exercise Tolerance: Good hypertension, (-) angina+ CAD and + Cardiac Stents (2016)  Normal cardiovascular exam  LHC 10/2014: mid LAD 99% stenosis s/p PCI/DES, 40% stenosis in the proximal LAD, D1 40% stenosis at the ostium of the vessel, EF > 55%   Neuro/Psych negative neurological ROS  negative psych ROS   GI/Hepatic negative GI ROS, Neg liver ROS,   Endo/Other  negative endocrine ROS  Renal/GU negative Renal ROS  negative genitourinary   Musculoskeletal   Abdominal Normal abdominal exam  (+)   Peds  Hematology negative hematology ROS (+)   Anesthesia Other Findings Past Medical History: No date: Coronary artery disease     Comment:  a. LHC 10/2014: mid LAD 99% stenosis s/p PCI/DES, 40%               stenosis in the proximal LAD, D1 40% stenosis at the               ostium of the vessel, EF > 55% No date: Hyperlipidemia No date: Hypertension  Past Surgical History: 11/08/2014: CARDIAC CATHETERIZATION 11/08/2014: CORONARY ANGIOPLASTY WITH STENT PLACEMENT     Comment:  drug eluting stent placement to the mid LAD 2000: VASECTOMY  BMI    Body Mass Index: 28.41 kg/m      Reproductive/Obstetrics negative OB ROS                             Anesthesia Physical Anesthesia Plan  ASA: 2  Anesthesia Plan: General   Post-op Pain Management:    Induction: Intravenous  PONV Risk Score and Plan: 2 and Propofol infusion and TIVA  Airway Management Planned: Nasal Cannula and Natural Airway  Additional Equipment:   Intra-op  Plan:   Post-operative Plan:   Informed Consent: I have reviewed the patients History and Physical, chart, labs and discussed the procedure including the risks, benefits and alternatives for the proposed anesthesia with the patient or authorized representative who has indicated his/her understanding and acceptance.     Dental Advisory Given  Plan Discussed with: Anesthesiologist, CRNA and Surgeon  Anesthesia Plan Comments:         Anesthesia Quick Evaluation

## 2021-10-21 ENCOUNTER — Telehealth: Payer: Self-pay | Admitting: Cardiovascular Disease

## 2021-10-21 NOTE — Telephone Encounter (Signed)
Pt c/o medication issue:  1. Name of Medication: amlodipine   2. How are you currently taking this medication (dosage and times per day)? 10 mg po q d as bp has been trending up since frequent back pain ( previously discussed with Gollan to increase if needed to whole pill )   3. Are you having a reaction (difficulty breathing--STAT)? No   4. What is your medication issue? Patient requesting new rx with 10 mg po q d be sent to [facility] [address] [place]

## 2021-10-22 NOTE — Telephone Encounter (Signed)
Patient returning call and aware refill change pending md response.   Patient understands and will await updated refill.

## 2021-10-22 NOTE — Telephone Encounter (Signed)
Jeff Merry, RN  Minna Merritts, MD 19 hours ago (1:39 PM)   Please advise if can send in RX for amlodipine 10 mg daily. Current order is for 5 mg daily, pt states that per recent conversation with you he could go up to 10 mg daily due to increased BP from back pain. Last visit with you was 05/20/21 and it did not mention this.   Thanks,  Jeff Fields pt to give update that we are waiting for Dr. Rockey Situ response for recc of above request.  No answer. LDM with this on pt's vm.

## 2021-10-26 MED ORDER — AMLODIPINE BESYLATE 10 MG PO TABS: 10.0000 mg | ORAL_TABLET | Freq: Every day | ORAL | 3 refills | Status: DC

## 2021-10-26 NOTE — Telephone Encounter (Signed)
Followed-up with Mr. Nunn, advised on Dr. Windell Hummingbird approval for increase of amlodipine to 10 mg daily.   LDM on cell VM (DPR approved)  Ok to send in amlodipine 10 daily  Would back down to 5 daily if BP gets low, dizziness on standing  Thx  TGollan   Updated script sent to [facility], advised to call pharmacy when refills are needed. May return call to office with any further concerns or questions.

## 2021-11-10 ENCOUNTER — Encounter: Payer: Self-pay | Admitting: Unknown Physician Specialty

## 2021-11-10 ENCOUNTER — Other Ambulatory Visit: Payer: Self-pay

## 2021-11-10 ENCOUNTER — Encounter: Payer: Self-pay | Admitting: Family Medicine

## 2021-11-10 ENCOUNTER — Other Ambulatory Visit: Payer: Self-pay | Admitting: Unknown Physician Specialty

## 2021-11-10 ENCOUNTER — Ambulatory Visit: Payer: 59 | Admitting: Unknown Physician Specialty

## 2021-11-10 ENCOUNTER — Ambulatory Visit
Admission: RE | Admit: 2021-11-10 | Discharge: 2021-11-10 | Disposition: A | Discharge status: Home / Self Care | Payer: 59 | Attending: Family Medicine | Admitting: Family Medicine

## 2021-11-10 ENCOUNTER — Ambulatory Visit
Admission: RE | Admit: 2021-11-10 | Discharge: 2021-11-10 | Disposition: A | Discharge status: Home / Self Care | Payer: 59 | Source: Ambulatory Visit | Attending: Unknown Physician Specialty | Admitting: Unknown Physician Specialty

## 2021-11-10 VITALS — BP 122/72 | HR 64 | Temp 98.5°F | Wt 201.8 lb

## 2021-11-10 DIAGNOSIS — R0781 Pleurodynia: Secondary | ICD-10-CM | POA: Insufficient documentation

## 2021-11-10 DIAGNOSIS — G479 Sleep disorder, unspecified: Secondary | ICD-10-CM

## 2021-11-10 NOTE — Progress Notes (Signed)
BP 122/72    Pulse 64    Temp 98.5 F (36.9 C) (Oral)    Wt 201 lb 12.8 oz (91.5 kg)    SpO2 96%    BMI 28.96 kg/m    Subjective:    Patient ID: Jeff Fields, male    DOB: 02-Apr-1961, 61 y.o.   MRN: [id number]  HPI: Jeff Fields is a 61 y.o. male  Chief Complaint  Patient presents with   Pain    Pt states he has been feeling different things in his R side for a while now. States sometimes he moves, he feels a pinching feeling, sometimes he states he feels more achy. States the pain is worst when twisting, bending, or moving.    Pt states he has a burning pain below right rib and radiates to the back.  Hurts with twisting.  A pinching on left side.  This is persistent. And going on for months. Takes Gabapentin which doesn't seem to help.  So SOB, smoked many years ago but quit over 30 years ago.  Up to date on colonoscopy.    Increased Amlodipine to 10 mg which has resulted in swelling of legs.    Reviewed abdominal CT in November without tumors or abnormalities of hepatobiliary system.    Insomnia- Takes trazadone on occasion  Depression screen [facility] 2/9 11/10/2021 09/01/2021 08/18/2021 03/04/2020 01/01/2019  Decreased Interest 0 0 0 0 0  Down, Depressed, Hopeless 0 0 0 0 0  PHQ - 2 Score 0 0 0 0 0  Altered sleeping 2 1 1 2  -  Tired, decreased energy 2 1 2 2  -  Change in appetite 0 0 0 0 -  Feeling bad or failure about yourself  0 0 0 0 -  Trouble concentrating 0 0 0 0 -  Moving slowly or fidgety/restless 0 0 0 0 -  Suicidal thoughts 0 0 0 0 -  PHQ-9 Score 4 2 3 4  -  Difficult doing work/chores - Not difficult at all - - -     Relevant past medical, surgical, family and social history reviewed and updated as indicated. Interim medical history since our last visit reviewed. Allergies and medications reviewed and updated.  Review of Systems  Per HPI unless specifically indicated above     Objective:    BP 122/72    Pulse 64    Temp 98.5 F (36.9 C) (Oral)    Wt 201 lb 12.8  oz (91.5 kg)    SpO2 96%    BMI 28.96 kg/m   Wt Readings from Last 3 Encounters:  11/10/21 201 lb 12.8 oz (91.5 kg)  10/09/21 198 lb (89.8 kg)  09/01/21 200 lb 9.6 oz (91 kg)    Physical Exam Constitutional:      General: He is not in acute distress.    Appearance: Normal appearance. He is well-developed.  HENT:     Head: Normocephalic and atraumatic.  Eyes:     General: Lids are normal. No scleral icterus.       Right eye: No discharge.        Left eye: No discharge.     Conjunctiva/sclera: Conjunctivae normal.  Neck:     Vascular: No carotid bruit or JVD.  Cardiovascular:     Rate and Rhythm: Normal rate and regular rhythm.     Heart sounds: Normal heart sounds.  Pulmonary:     Effort: Pulmonary effort is normal. No respiratory distress.     Breath  sounds: Normal breath sounds.  Abdominal:     Palpations: There is no hepatomegaly or splenomegaly.  Musculoskeletal:        General: Normal range of motion.     Cervical back: Normal range of motion and neck supple.  Skin:    General: Skin is warm and dry.     Coloration: Skin is not pale.     Findings: No rash.  Neurological:     Mental Status: He is alert and oriented to person, place, and time.  Psychiatric:        Behavior: Behavior normal.        Thought Content: Thought content normal.        Judgment: Judgment normal.    Results for orders placed or performed in visit on 08/18/21  Urinalysis, Routine w reflex microscopic  Result Value Ref Range   Specific Gravity, UA 1.010 1.005 - 1.030   pH, UA 6.0 5.0 - 7.5   Color, UA Yellow Yellow   Appearance Ur Clear Clear   Leukocytes,UA Negative Negative   Protein,UA Negative Negative/Trace   Glucose, UA Negative Negative   Ketones, UA Negative Negative   RBC, UA Negative Negative   Bilirubin, UA Negative Negative   Urobilinogen, Ur 0.2 0.2 - 1.0 mg/dL   Nitrite, UA Negative Negative      Assessment & Plan:   Problem List Items Addressed This Visit        Unprioritized   Sleep disturbance    Discussed the following non-medication strategies: 1 hours before bed -magnesium supplement - Gycinate. Sulfate, Extended release melatonin  Blue light blocking glasses in the evening  2-20 minutes in morning light - walk 2-20 minutes in early evening.    Yoga nidra- videos.        Other Visit Diagnoses     Rib pain on right side    -  Primary   Evaluation of right sided rib cage and upper abdominal pain.  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  Get chest x-ray.  CT reviewed   Relevant Orders   DG Chest 2 View        Follow up plan: Return if symptoms worsen or fail to improve.

## 2021-11-10 NOTE — Assessment & Plan Note (Signed)
Discussed the following non-medication strategies: 1 hours before bed -magnesium supplement - Gycinate. Sulfate, Extended release melatonin  Blue light blocking glasses in the evening  2-20 minutes in morning light - walk 2-20 minutes in early evening.    Yoga nidra- videos.

## 2021-11-10 NOTE — Patient Instructions (Addendum)
1 hours before bed -magnesium supplement - Gycinate. Sulfate, Extended release melatonin  Blue light blocking glasses in the evening  2-20 minutes in morning light - walk 2-20 minutes in early evening.    Yoga nidra- videos.    CBT for sleep-"Google it"

## 2021-11-30 ENCOUNTER — Encounter: Payer: 59 | Admitting: Family Medicine

## 2021-12-03 ENCOUNTER — Other Ambulatory Visit: Payer: Self-pay | Admitting: Family Medicine

## 2021-12-05 NOTE — Telephone Encounter (Signed)
Requested medications are due for refill today.  unsure ? ?Requested medications are on the active medications list.  yes ? ?Last refill. 08/18/2021 330 0 refills ? ?Future visit scheduled.   no ? ?Notes to clinic. Medication not delegated. Per med list  - pt is not taking 11/10/2021.  ? ? ? ?Requested Prescriptions  ?Pending Prescriptions Disp Refills  ? cyclobenzaprine (FLEXERIL) 10 MG tablet [Pharmacy Med Name: Cyclobenzaprine HCl 10 MG Oral Tablet] 30 tablet 0  ?  Sig: Take 1 tablet by mouth three times daily as needed for muscle spasm  ?  ? Not Delegated - Analgesics:  Muscle Relaxants Failed - 12/03/2021 10:19 AM  ?  ?  Failed - This refill cannot be delegated  ?  ?  Passed - Valid encounter within last 6 months  ?  Recent Outpatient Visits   ? ?      ? 3 weeks ago Rib pain on right side  ? [facility] Kathrine Haddock, NP  ? 3 months ago Other intervertebral disc degeneration, thoracic region  ? [place], [place] P, DO  ? 3 months ago Acute low back pain, unspecified back pain laterality, unspecified whether sciatica present  ? [place], [place] P, DO  ? 7 months ago Acute midline low back pain with right-sided sciatica  ? [place] McElwee, Lauren A, NP  ? 1 year ago COVID-19  ? [place], [place] P, DO  ? ?  ?  ? ?  ?  ?  ?Signed Prescriptions Disp Refills  ? meloxicam (MOBIC) 15 MG tablet 30 tablet 0  ?  Sig: TAKE 1 TABLET BY MOUTH ONCE DAILY AS NEEDED FOR PAIN  ?  ? Analgesics:  COX2 Inhibitors Failed - 12/03/2021 10:19 AM  ?  ?  Failed - Manual Review: Labs are only required if the patient has taken medication for more than 8 weeks.  ?  ?  Passed - HGB in normal range and within 360 days  ?  Hemoglobin  ?Date Value Ref Range Status  ?07/31/2021 14.2 13.0 - 17.0 g/dL Final  ?03/04/2020 14.8 13.0 - 17.7 g/dL Final  ?  ?  ?  ?  Passed - Cr in normal range and within 360 days  ?  Creatinine, Ser  ?Date Value Ref Range  Status  ?07/31/2021 1.08 0.61 - 1.24 mg/dL Final  ?  ?  ?  ?  Passed - HCT in normal range and within 360 days  ?  HCT  ?Date Value Ref Range Status  ?07/31/2021 39.9 39.0 - 52.0 % Final  ? ?Hematocrit  ?Date Value Ref Range Status  ?03/04/2020 41.4 37.5 - 51.0 % Final  ?  ?  ?  ?  Passed - AST in normal range and within 360 days  ?  AST  ?Date Value Ref Range Status  ?07/31/2021 19 15 - 41 U/L Final  ?  ?  ?  ?  Passed - ALT in normal range and within 360 days  ?  ALT  ?Date Value Ref Range Status  ?07/31/2021 18 0 - 44 U/L Final  ?  ?  ?  ?  Passed - eGFR is 30 or above and within 360 days  ?  GFR calc Af Amer  ?Date Value Ref Range Status  ?03/04/2020 91 >59 mL/min/1.73 Final  ?  Comment:  ?  **[facility] currently reports eGFR in compliance with the current** ?  recommendations of  the [employer]. [facility] will ?  update reporting as new guidelines are published from the NKF-ASN ?  Task force. ?  ? ?GFR, Estimated  ?Date Value Ref Range Status  ?07/31/2021 >60 >60 mL/min Final  ?  Comment:  ?  (NOTE) ?Calculated using the CKD-EPI Creatinine Equation (2021) ?  ? ?eGFR  ?Date Value Ref Range Status  ?06/01/2021 77 >59 mL/min/1.73 Final  ?  ?  ?  ?  Passed - Patient is not pregnant  ?  ?  Passed - Valid encounter within last 12 months  ?  Recent Outpatient Visits   ? ?      ? 3 weeks ago Rib pain on right side  ? [facility] Kathrine Haddock, NP  ? 3 months ago Other intervertebral disc degeneration, thoracic region  ? [place], [place] P, DO  ? 3 months ago Acute low back pain, unspecified back pain laterality, unspecified whether sciatica present  ? [place], [place] P, DO  ? 7 months ago Acute midline low back pain with right-sided sciatica  ? [place] McElwee, Lauren A, NP  ? 1 year ago COVID-19  ? [place], [place] P, DO  ? ?  ?  ? ?  ?  ?  ?  ?

## 2021-12-05 NOTE — Telephone Encounter (Signed)
Requested Prescriptions  ?Pending Prescriptions Disp Refills  ?? meloxicam (MOBIC) 15 MG tablet [Pharmacy Med Name: Meloxicam 15 MG Oral Tablet] 30 tablet 0  ?  Sig: TAKE 1 TABLET BY MOUTH ONCE DAILY AS NEEDED FOR PAIN  ?  ? Analgesics:  COX2 Inhibitors Failed - 12/03/2021 10:19 AM  ?  ?  Failed - Manual Review: Labs are only required if the patient has taken medication for more than 8 weeks.  ?  ?  Passed - HGB in normal range and within 360 days  ?  Hemoglobin  ?Date Value Ref Range Status  ?07/31/2021 14.2 13.0 - 17.0 g/dL Final  ?03/04/2020 14.8 13.0 - 17.7 g/dL Final  ?   ?  ?  Passed - Cr in normal range and within 360 days  ?  Creatinine, Ser  ?Date Value Ref Range Status  ?07/31/2021 1.08 0.61 - 1.24 mg/dL Final  ?   ?  ?  Passed - HCT in normal range and within 360 days  ?  HCT  ?Date Value Ref Range Status  ?07/31/2021 39.9 39.0 - 52.0 % Final  ? ?Hematocrit  ?Date Value Ref Range Status  ?03/04/2020 41.4 37.5 - 51.0 % Final  ?   ?  ?  Passed - AST in normal range and within 360 days  ?  AST  ?Date Value Ref Range Status  ?07/31/2021 19 15 - 41 U/L Final  ?   ?  ?  Passed - ALT in normal range and within 360 days  ?  ALT  ?Date Value Ref Range Status  ?07/31/2021 18 0 - 44 U/L Final  ?   ?  ?  Passed - eGFR is 30 or above and within 360 days  ?  GFR calc Af Amer  ?Date Value Ref Range Status  ?03/04/2020 91 >59 mL/min/1.73 Final  ?  Comment:  ?  **[facility] currently reports eGFR in compliance with the current** ?  recommendations of the [employer]. [facility] will ?  update reporting as new guidelines are published from the NKF-ASN ?  Task force. ?  ? ?GFR, Estimated  ?Date Value Ref Range Status  ?07/31/2021 >60 >60 mL/min Final  ?  Comment:  ?  (NOTE) ?Calculated using the CKD-EPI Creatinine Equation (2021) ?  ? ?eGFR  ?Date Value Ref Range Status  ?06/01/2021 77 >59 mL/min/1.73 Final  ?   ?  ?  Passed - Patient is not pregnant  ?  ?  Passed - Valid encounter within last 12 months  ?  Recent  Outpatient Visits   ?      ? 3 weeks ago Rib pain on right side  ? [facility] Kathrine Haddock, NP  ? 3 months ago Other intervertebral disc degeneration, thoracic region  ? [place], [place] P, DO  ? 3 months ago Acute low back pain, unspecified back pain laterality, unspecified whether sciatica present  ? [place], [place] P, DO  ? 7 months ago Acute midline low back pain with right-sided sciatica  ? [place] McElwee, Lauren A, NP  ? 1 year ago COVID-19  ? [place], [place] P, DO  ?  ?  ? ?  ?  ?  ?? cyclobenzaprine (FLEXERIL) 10 MG tablet [Pharmacy Med Name: Cyclobenzaprine HCl 10 MG Oral Tablet] 30 tablet 0  ?  Sig: Take 1 tablet by mouth three times daily as needed for muscle spasm  ?  ?  Not Delegated - Analgesics:  Muscle Relaxants Failed - 12/03/2021 10:19 AM  ?  ?  Failed - This refill cannot be delegated  ?  ?  Passed - Valid encounter within last 6 months  ?  Recent Outpatient Visits   ?      ? 3 weeks ago Rib pain on right side  ? [facility] Kathrine Haddock, NP  ? 3 months ago Other intervertebral disc degeneration, thoracic region  ? [place], [place] P, DO  ? 3 months ago Acute low back pain, unspecified back pain laterality, unspecified whether sciatica present  ? [place], [place] P, DO  ? 7 months ago Acute midline low back pain with right-sided sciatica  ? [place] McElwee, Lauren A, NP  ? 1 year ago COVID-19  ? [place], [place] P, DO  ?  ?  ? ?  ?  ?  ? ?

## 2021-12-24 ENCOUNTER — Encounter: Payer: Self-pay | Admitting: Family Medicine

## 2021-12-24 ENCOUNTER — Ambulatory Visit (INDEPENDENT_AMBULATORY_CARE_PROVIDER_SITE_OTHER): Payer: 59 | Admitting: Family Medicine

## 2021-12-24 VITALS — BP 129/79 | HR 62 | Temp 98.5°F | Ht 70.0 in | Wt 200.7 lb

## 2021-12-24 DIAGNOSIS — I10 Essential (primary) hypertension: Secondary | ICD-10-CM

## 2021-12-24 DIAGNOSIS — Z0000 Encounter for general adult medical examination without abnormal findings: Secondary | ICD-10-CM

## 2021-12-24 DIAGNOSIS — I209 Angina pectoris, unspecified: Secondary | ICD-10-CM | POA: Diagnosis not present

## 2021-12-24 DIAGNOSIS — I25118 Atherosclerotic heart disease of native coronary artery with other forms of angina pectoris: Secondary | ICD-10-CM

## 2021-12-24 DIAGNOSIS — E782 Mixed hyperlipidemia: Secondary | ICD-10-CM

## 2021-12-24 DIAGNOSIS — I700 Atherosclerosis of aorta: Secondary | ICD-10-CM

## 2021-12-24 LAB — MICROALBUMIN, URINE WAIVED
Creatinine, Urine Waived: 10 mg/dL (ref 10–300)
Microalb, Ur Waived: 10 mg/L (ref 0–19)
Microalb/Creat Ratio: <30 mg/g (ref <30)

## 2021-12-24 LAB — URINALYSIS, ROUTINE W REFLEX MICROSCOPIC
Bilirubin, UA: NEGATIVE
Glucose, UA: NEGATIVE
Ketones, UA: NEGATIVE
Leukocytes,UA: NEGATIVE
Nitrite, UA: NEGATIVE
Protein,UA: NEGATIVE
RBC, UA: NEGATIVE
Specific Gravity, UA: 1.01 (ref 1.005–1.030)
Urobilinogen, Ur: 0.2 mg/dL (ref 0.2–1.0)
pH, UA: 6.5 (ref 5.0–7.5)

## 2021-12-24 MED ORDER — TRAZODONE HCL 50 MG PO TABS: ORAL_TABLET | ORAL | 1 refills | Status: DC

## 2021-12-24 MED ORDER — MELOXICAM 15 MG PO TABS: ORAL_TABLET | ORAL | 1 refills | Status: DC

## 2021-12-24 NOTE — Assessment & Plan Note (Signed)
Under good control on current regimen. Continue current regimen. Continue to monitor. Call with any concerns. Refills through cardiology. Labs drawn today. Continue to follow with cardiology.   ? ?

## 2021-12-24 NOTE — Assessment & Plan Note (Signed)
Under good control on current regimen. Continue current regimen. Continue to monitor. Call with any concerns. Refills through cardiology. Labs drawn today. Continue to follow with cardiology.   ? ?

## 2021-12-24 NOTE — Assessment & Plan Note (Signed)
Will keep BP and cholesterol under good control. Continue to follow with cardiology. Call with any concerns.  

## 2021-12-24 NOTE — Progress Notes (Signed)
? ?BP 129/79   Pulse 62   Temp 98.5 ?F (36.9 ?C)   Ht 5\' 10"  (1.778 m)   Wt 200 lb 11.2 oz (91 kg)   SpO2 99%   BMI 28.80 kg/m?   ? ?Subjective:  ? ? Patient ID: Jeff Fields, male    DOB: 08/13/1961, 61 y.o.   MRN: [id number] ? ?HPI: ?Jeff Fields is a 61 y.o. male presenting on 12/24/2021 for comprehensive medical examination. Current medical complaints include: ? ?HYPERTENSION / HYPERLIPIDEMIA ?Satisfied with current treatment? yes ?Duration of hypertension: chronic ?BP monitoring frequency: not checking ?BP medication side effects: no ?Past BP meds: amlodipine, carvedilol ?Duration of hyperlipidemia: chronic ?Cholesterol medication side effects: no ?Cholesterol supplements: none ?Past cholesterol medications: crestor, zetia ?Medication compliance: excellent compliance ?Aspirin: yes ?Recent stressors: no ?Recurrent headaches: no ?Visual changes: no ?Palpitations: no ?Dyspnea: no ?Chest pain: no ?Lower extremity edema: no ?Dizzy/lightheaded: no ? ?Interim Problems from his last visit: no ? ?Depression Screen done today and results listed below:  ? ?  12/24/2021  ?  3:47 PM 11/10/2021  ?  2:22 PM 09/01/2021  ?  4:00 PM 08/18/2021  ?  1:05 PM 03/04/2020  ?  4:01 PM  ?Depression screen PHQ 2/9  ?Decreased Interest 0 0 0 0 0  ?Down, Depressed, Hopeless 0 0 0 0 0  ?PHQ - 2 Score 0 0 0 0 0  ?Altered sleeping 1 2 1 1 2   ?Tired, decreased energy 1 2 1 2 2   ?Change in appetite 0 0 0 0 0  ?Feeling bad or failure about yourself  0 0 0 0 0  ?Trouble concentrating 0 0 0 0 0  ?Moving slowly or fidgety/restless 0 0 0 0 0  ?Suicidal thoughts 0 0 0 0 0  ?PHQ-9 Score 2 4 2 3 4   ?Difficult doing work/chores   Not difficult at all    ? ? ?Past Medical History:  ?Past Medical History:  ?Diagnosis Date  ? Coronary artery disease   ? a. LHC 10/2014: mid LAD 99% stenosis s/p PCI/DES, 40% stenosis in the proximal LAD, D1 40% stenosis at the ostium of the vessel, EF > 55%  ? Hyperlipidemia   ? Hypertension   ? ? ?Surgical History:  ?Past  Surgical History:  ?Procedure Laterality Date  ? CARDIAC CATHETERIZATION  11/08/2014  ? COLONOSCOPY WITH PROPOFOL N/A 10/09/2021  ? Procedure: COLONOSCOPY WITH PROPOFOL;  Surgeon: Wyline MoodAnna, Kiran, MD;  [facility];  Service: Gastroenterology;  Laterality: N/A;  ? CORONARY ANGIOPLASTY WITH STENT PLACEMENT  11/08/2014  ? drug eluting stent placement to the mid LAD  ? VASECTOMY  2000  ? ? ?Medications:  ?Current Outpatient Medications on File Prior to Visit  ?Medication Sig  ? amLODipine (NORVASC) 10 MG tablet Take 1 tablet (10 mg total) by mouth daily.  ? aspirin 81 MG tablet Take 81 mg by mouth daily.  ? carvedilol (COREG) 25 MG tablet Take 1 tablet (25 mg total) by mouth 2 (two) times daily.  ? Coenzyme Q10 (COQ-10) 200 MG CAPS Take 200 mg by mouth daily.  ? cyanocobalamin 1000 MCG tablet Take 1,000 mcg by mouth daily.  ? cyclobenzaprine (FLEXERIL) 10 MG tablet Take 1 tablet by mouth three times daily as needed for muscle spasm  ? ezetimibe (ZETIA) 10 MG tablet Take 1 tablet (10 mg total) by mouth daily.  ? rosuvastatin (CRESTOR) 5 MG tablet TAKE 1 TABLET DAILY  ? ?No current facility-administered medications on file prior to visit.  ? ? ?  Allergies:  ?No Known Allergies ? ?Social History:  ?Social History  ? ?Socioeconomic History  ? Marital status: Married  ?  Spouse name: Not on file  ? Number of children: Not on file  ? Years of education: Not on file  ? Highest education level: Not on file  ?Occupational History  ? Not on file  ?Tobacco Use  ? Smoking status: Former  ?  Packs/day: 1.00  ?  Types: Cigarettes  ?  Quit date: 09/13/1988  ?  Years since quitting: 33.3  ? Smokeless tobacco: Never  ?Vaping Use  ? Vaping Use: Never used  ?Substance and Sexual Activity  ? Alcohol use: Yes  ?  Alcohol/week: 0.0 standard drinks  ?  Comment: Rarely  ? Drug use: No  ? Sexual activity: Yes  ?Other Topics Concern  ? Not on file  ?Social History Narrative  ? Not on file  ? ?Social Determinants of Health  ? ?Financial  Resource Strain: Not on file  ?Food Insecurity: Not on file  ?Transportation Needs: Not on file  ?Physical Activity: Not on file  ?Stress: Not on file  ?Social Connections: Not on file  ?Intimate Partner Violence: Not on file  ? ?Social History  ? ?Tobacco Use  ?Smoking Status Former  ? Packs/day: 1.00  ? Types: Cigarettes  ? Quit date: 09/13/1988  ? Years since quitting: 33.3  ?Smokeless Tobacco Never  ? ?Social History  ? ?Substance and Sexual Activity  ?Alcohol Use Yes  ? Alcohol/week: 0.0 standard drinks  ? Comment: Rarely  ? ? ?Family History:  ?Family History  ?Problem Relation Age of Onset  ? Hypertension Mother   ? Hypertension Father   ? Arrhythmia Father   ?     Pacemaker implant  ? Hypertension Brother   ? Arthritis Brother   ? Stroke Maternal Grandmother   ? Lung cancer Maternal Grandfather   ? ? ?Past medical history, surgical history, medications, allergies, family history and social history reviewed with patient today and changes made to appropriate areas of the chart.  ? ?Review of Systems  ?Constitutional: Negative.   ?HENT: Negative.    ?Eyes: Negative.   ?Respiratory: Negative.    ?Cardiovascular:  Positive for leg swelling. Negative for chest pain, palpitations, orthopnea, claudication and PND.  ?Gastrointestinal: Negative.   ?Genitourinary: Negative.   ?Musculoskeletal: Negative.   ?Skin: Negative.   ?Neurological: Negative.   ?Endo/Heme/Allergies: Negative.   ?Psychiatric/Behavioral: Negative.    ?All other ROS negative except what is listed above and in the HPI.  ? ?   ?Objective:  ?  ?BP 129/79   Pulse 62   Temp 98.5 ?F (36.9 ?C)   Ht 5\' 10"  (1.778 m)   Wt 200 lb 11.2 oz (91 kg)   SpO2 99%   BMI 28.80 kg/m?   ?Wt Readings from Last 3 Encounters:  ?12/24/21 200 lb 11.2 oz (91 kg)  ?11/10/21 201 lb 12.8 oz (91.5 kg)  ?10/09/21 198 lb (89.8 kg)  ?  ?Physical Exam ?Vitals and nursing note reviewed.  ?Constitutional:   ?   General: He is not in acute distress. ?   Appearance: Normal  appearance. He is obese. He is not ill-appearing, toxic-appearing or diaphoretic.  ?HENT:  ?   Head: Normocephalic and atraumatic.  ?   Right Ear: Tympanic membrane, ear canal and external ear normal. There is no impacted cerumen.  ?   Left Ear: Tympanic membrane, ear canal and external ear normal. There is no impacted  cerumen.  ?   Nose: Nose normal. No congestion or rhinorrhea.  ?   Mouth/Throat:  ?   Mouth: Mucous membranes are moist.  ?   Pharynx: Oropharynx is clear. No oropharyngeal exudate or posterior oropharyngeal erythema.  ?Eyes:  ?   General: No scleral icterus.    ?   Right eye: No discharge.     ?   Left eye: No discharge.  ?   Extraocular Movements: Extraocular movements intact.  ?   Conjunctiva/sclera: Conjunctivae normal.  ?   Pupils: Pupils are equal, round, and reactive to light.  ?Neck:  ?   Vascular: No carotid bruit.  ?Cardiovascular:  ?   Rate and Rhythm: Normal rate and regular rhythm.  ?   Pulses: Normal pulses.  ?   Heart sounds: No murmur heard. ?  No friction rub. No gallop.  ?Pulmonary:  ?   Effort: Pulmonary effort is normal. No respiratory distress.  ?   Breath sounds: Normal breath sounds. No stridor. No wheezing, rhonchi or rales.  ?Chest:  ?   Chest wall: No tenderness.  ?Abdominal:  ?   General: Abdomen is flat. Bowel sounds are normal. There is no distension.  ?   Palpations: Abdomen is soft. There is no mass.  ?   Tenderness: There is no abdominal tenderness. There is no right CVA tenderness, left CVA tenderness, guarding or rebound.  ?   Hernia: No hernia is present.  ?Genitourinary: ?   Comments: Genital exam deferred with shared decision making ?Musculoskeletal:     ?   General: No swelling, tenderness, deformity or signs of injury.  ?   Cervical back: Normal range of motion and neck supple. No rigidity. No muscular tenderness.  ?   Right lower leg: No edema.  ?   Left lower leg: No edema.  ?Lymphadenopathy:  ?   Cervical: No cervical adenopathy.  ?Skin: ?   General: Skin is  warm and dry.  ?   Capillary Refill: Capillary refill takes less than 2 seconds.  ?   Coloration: Skin is not jaundiced or pale.  ?   Findings: No bruising, erythema, lesion or rash.  ?Neurological:  ?   General: No focal deficit pres

## 2021-12-25 LAB — COMPREHENSIVE METABOLIC PANEL
ALT: 21 IU/L (ref 0–44)
AST: 19 IU/L (ref 0–40)
Albumin/Globulin Ratio: 2 (ref 1.2–2.2)
Albumin: 4.9 g/dL (ref 3.8–4.9)
Alkaline Phosphatase: 77 IU/L (ref 44–121)
BUN/Creatinine Ratio: 12 (ref 10–24)
BUN: 14 mg/dL (ref 8–27)
Bilirubin Total: 1.1 mg/dL (ref 0.0–1.2)
CO2: 20 mmol/L (ref 20–29)
Calcium: 9.5 mg/dL (ref 8.6–10.2)
Chloride: 103 mmol/L (ref 96–106)
Creatinine, Ser: 1.17 mg/dL (ref 0.76–1.27)
Globulin, Total: 2.4 g/dL (ref 1.5–4.5)
Glucose: 126 mg/dL — ABNORMAL HIGH (ref 70–99)
Potassium: 4.7 mmol/L (ref 3.5–5.2)
Sodium: 142 mmol/L (ref 134–144)
Total Protein: 7.3 g/dL (ref 6.0–8.5)
eGFR: 71 mL/min/{1.73_m2} (ref >59)

## 2021-12-25 LAB — TSH: TSH: 0.949 u[IU]/mL (ref 0.450–4.500)

## 2021-12-25 LAB — CBC WITH DIFFERENTIAL/PLATELET
Basophils Absolute: 0 10*3/uL (ref 0.0–0.2)
Basos: 0 %
EOS (ABSOLUTE): 0 10*3/uL (ref 0.0–0.4)
Eos: 0 %
Hematocrit: 40.8 % (ref 37.5–51.0)
Hemoglobin: 14.4 g/dL (ref 13.0–17.7)
Immature Grans (Abs): 0 10*3/uL (ref 0.0–0.1)
Immature Granulocytes: 0 %
Lymphocytes Absolute: 1.2 10*3/uL (ref 0.7–3.1)
Lymphs: 11 %
MCH: 32.1 pg (ref 26.6–33.0)
MCHC: 35.3 g/dL (ref 31.5–35.7)
MCV: 91 fL (ref 79–97)
Monocytes Absolute: 0.4 10*3/uL (ref 0.1–0.9)
Monocytes: 3 %
Neutrophils Absolute: 9.3 10*3/uL — ABNORMAL HIGH (ref 1.4–7.0)
Neutrophils: 86 %
Platelets: 212 10*3/uL (ref 150–450)
RBC: 4.49 x10E6/uL (ref 4.14–5.80)
RDW: 12.6 % (ref 11.6–15.4)
WBC: 11 10*3/uL — ABNORMAL HIGH (ref 3.4–10.8)

## 2021-12-25 LAB — LIPID PANEL W/O CHOL/HDL RATIO
Cholesterol, Total: 112 mg/dL (ref 100–199)
HDL: 46 mg/dL (ref >39)
LDL Chol Calc (NIH): 52 mg/dL (ref 0–99)
Triglycerides: 66 mg/dL (ref 0–149)
VLDL Cholesterol Cal: 14 mg/dL (ref 5–40)

## 2021-12-25 LAB — PSA: Prostate Specific Ag, Serum: 0.9 ng/mL (ref 0.0–4.0)

## 2022-04-01 IMAGING — DX DG CHEST 2V
2 series · 2 of 2 positions shown · non-contrast
Comparison: 11/06/2014

CLINICAL DATA: Rib pain

EXAM:
CHEST - 2 VIEW

[chest pa]
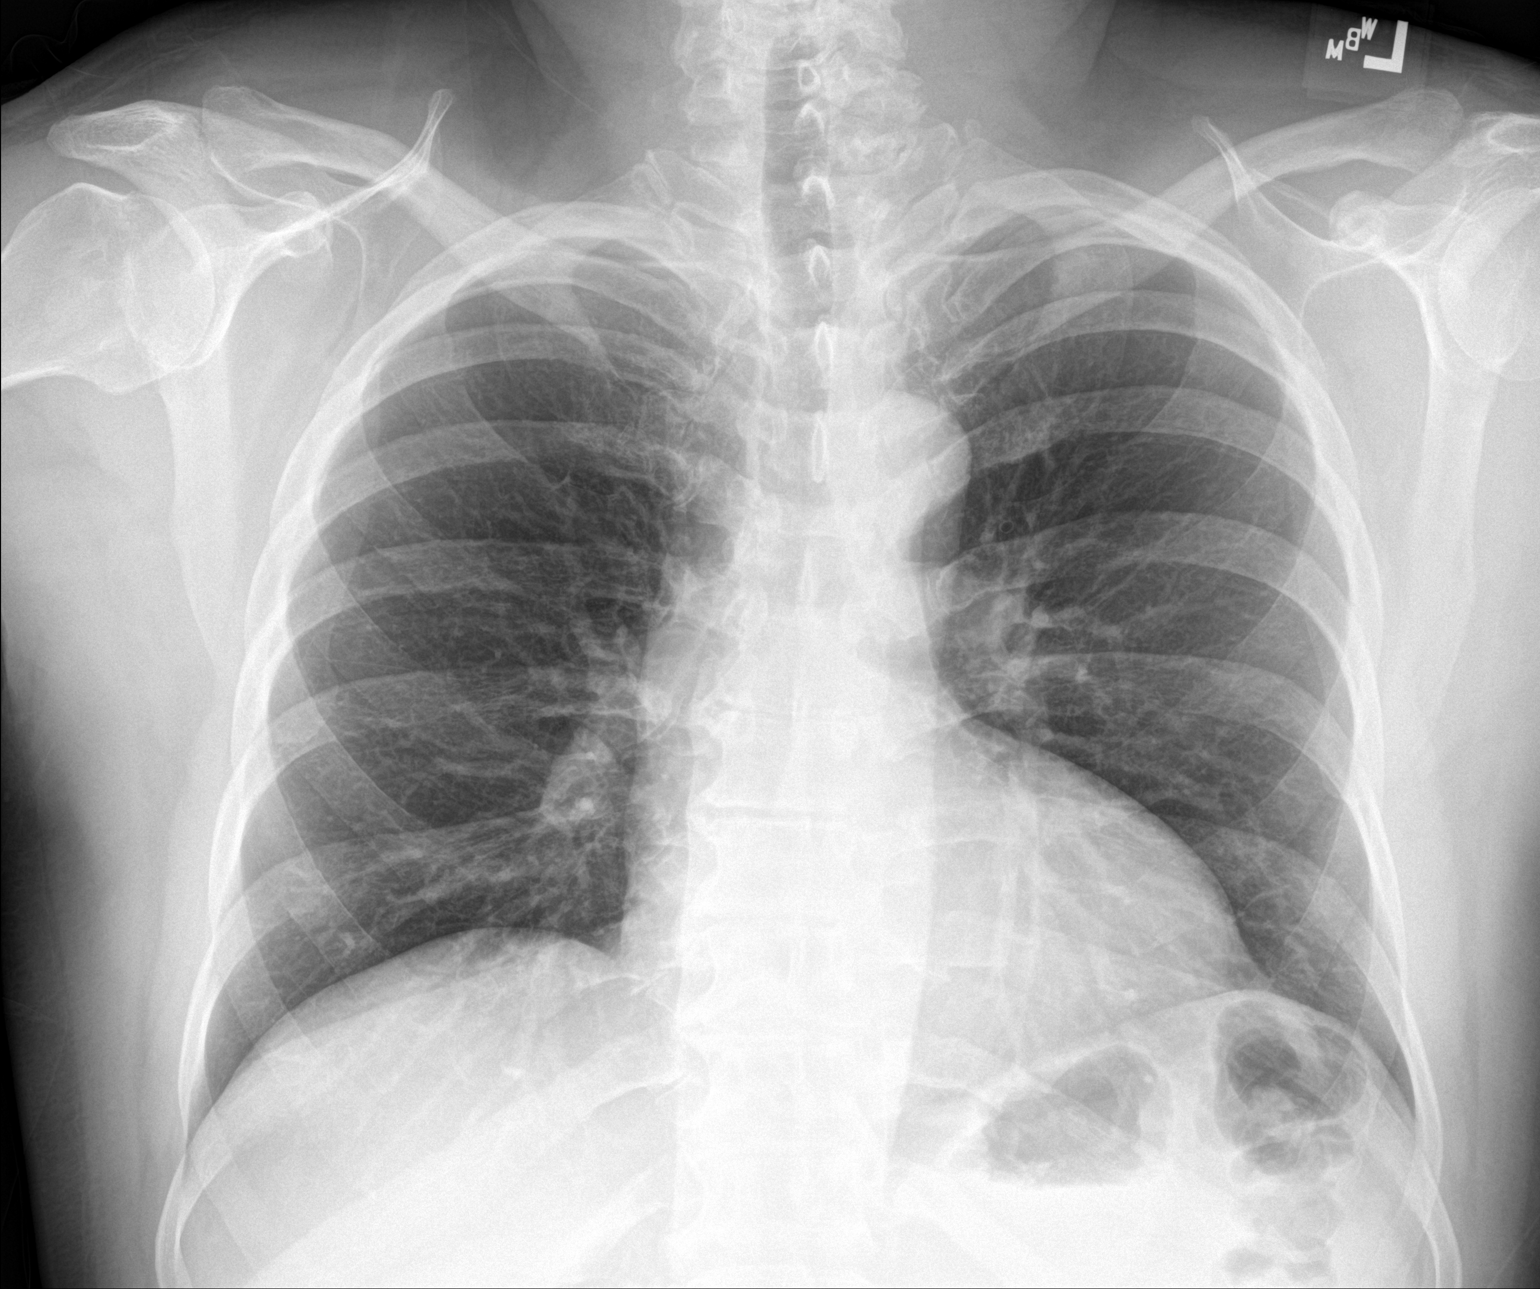

[chest lat]
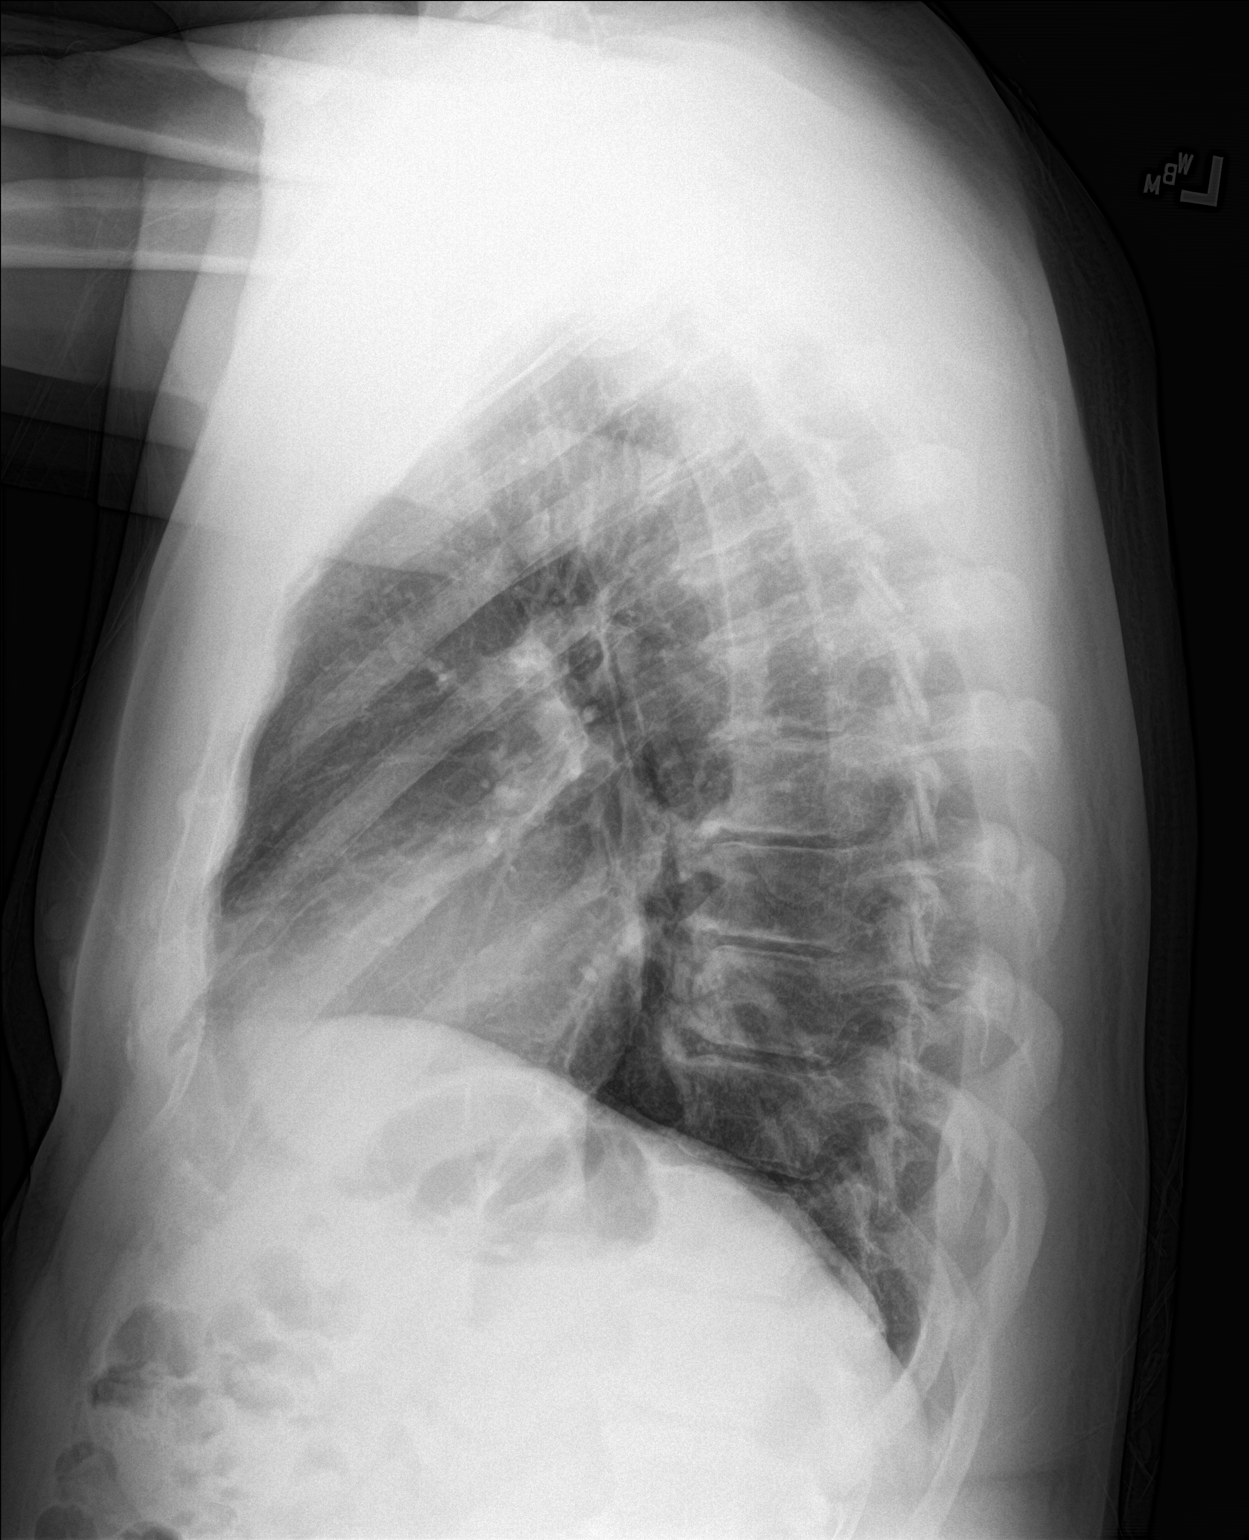

[2 of 2 positions shown; findings below may reference images not displayed]

FINDINGS: Lungs are well expanded, symmetric, and clear. No pneumothorax or
pleural effusion. Cardiac size within normal limits. Pulmonary
vascularity is normal. Osseous structures are age-appropriate. No
acute bone abnormality.
IMPRESSION: No active cardiopulmonary disease.

## 2022-05-03 ENCOUNTER — Other Ambulatory Visit: Payer: Self-pay | Admitting: Family Medicine

## 2022-05-04 ENCOUNTER — Encounter: Payer: Self-pay | Admitting: Family Medicine

## 2022-05-04 NOTE — Telephone Encounter (Signed)
Requested medications are due for refill today.  unsure  Requested medications are on the active medications list.  yes  Last refill. 12/07/2021 #30 0 refills  Future visit scheduled.   yes  Notes to clinic.  Refill is not delegated.    Requested Prescriptions  Pending Prescriptions Disp Refills   cyclobenzaprine (FLEXERIL) 10 MG tablet [Pharmacy Med Name: Cyclobenzaprine HCl 10 MG Oral Tablet] 30 tablet 0    Sig: Take 1 tablet by mouth three times daily as needed for muscle spasm     Not Delegated - Analgesics:  Muscle Relaxants Failed - 05/03/2022  1:07 PM      Failed - This refill cannot be delegated      Passed - Valid encounter within last 6 months    Recent Outpatient Visits           4 months ago Routine general medical examination at a health care facility   [facility], Megan P, DO   5 months ago Rib pain on right side   [facility] Gabriel Cirri, NP   8 months ago Other intervertebral disc degeneration, thoracic region   [facility], Megan P, DO   8 months ago Acute low back pain, unspecified back pain laterality, unspecified whether sciatica present   [facility], Megan P, DO   1 year ago Acute midline low back pain with right-sided sciatica   [facility] McElwee, Jake Church, NP       Future Appointments             In 1 month Johnson, Oralia Rud, DO [facility]

## 2022-06-18 ENCOUNTER — Other Ambulatory Visit: Payer: Self-pay | Admitting: Cardiovascular Disease

## 2022-06-18 DIAGNOSIS — E782 Mixed hyperlipidemia: Secondary | ICD-10-CM

## 2022-06-18 DIAGNOSIS — I25118 Atherosclerotic heart disease of native coronary artery with other forms of angina pectoris: Secondary | ICD-10-CM

## 2022-06-18 NOTE — Telephone Encounter (Signed)
Please contact patient please contact patient for annual f/u, last seen 05-2021. Refill pending.  Thank you

## 2022-06-25 ENCOUNTER — Ambulatory Visit: Payer: 59 | Admitting: Family Medicine

## 2022-06-28 ENCOUNTER — Ambulatory Visit: Payer: 59 | Admitting: Family Medicine

## 2022-06-28 ENCOUNTER — Encounter: Payer: Self-pay | Admitting: Family Medicine

## 2022-06-28 VITALS — BP 130/72 | HR 65 | Temp 98.0°F | Wt 194.5 lb

## 2022-06-28 DIAGNOSIS — E782 Mixed hyperlipidemia: Secondary | ICD-10-CM | POA: Diagnosis not present

## 2022-06-28 DIAGNOSIS — R0981 Nasal congestion: Secondary | ICD-10-CM | POA: Diagnosis not present

## 2022-06-28 DIAGNOSIS — I25118 Atherosclerotic heart disease of native coronary artery with other forms of angina pectoris: Secondary | ICD-10-CM | POA: Diagnosis not present

## 2022-06-28 DIAGNOSIS — I10 Essential (primary) hypertension: Secondary | ICD-10-CM | POA: Diagnosis not present

## 2022-06-28 MED ORDER — AMLODIPINE BESYLATE 10 MG PO TABS
10.0000 mg | ORAL_TABLET | Freq: Every day | ORAL | 0 refills | Status: DC
Start: 2022-06-28 — End: 2022-08-31

## 2022-06-28 MED ORDER — TRAZODONE HCL 50 MG PO TABS: ORAL_TABLET | ORAL | 1 refills | Status: DC

## 2022-06-28 MED ORDER — EZETIMIBE 10 MG PO TABS
10.0000 mg | ORAL_TABLET | Freq: Every day | ORAL | 0 refills | Status: DC
Start: 2022-06-28 — End: 2022-08-31

## 2022-06-28 MED ORDER — CARVEDILOL 25 MG PO TABS: 25.0000 mg | ORAL_TABLET | Freq: Two times a day (BID) | ORAL | 0 refills | Status: DC

## 2022-06-28 MED ORDER — FLUTICASONE PROPIONATE 50 MCG/ACT NA SUSP: 2.0000 | Freq: Every day | NASAL | 6 refills | Status: AC

## 2022-06-28 MED ORDER — ROSUVASTATIN CALCIUM 5 MG PO TABS: 5.0000 mg | ORAL_TABLET | Freq: Every day | ORAL | 0 refills | Status: DC

## 2022-06-28 MED ORDER — MELOXICAM 15 MG PO TABS: ORAL_TABLET | ORAL | 1 refills | Status: DC

## 2022-06-28 NOTE — Assessment & Plan Note (Signed)
Under good control on current regimen. Continue current regimen. Continue to monitor. Call with any concerns. Refills given until he gets back into see cardiology in December. Labs drawn today.

## 2022-06-28 NOTE — Progress Notes (Signed)
 BP 130/72   Pulse 65   Temp 98 F (36.7 C)   Wt 194 lb 8 oz (88.2 kg)   SpO2 99%   BMI 27.91 kg/m    Subjective:    Patient ID: Jeff Fields, male    DOB: 04/21/1961, 61 y.o.   MRN: [id number]  HPI: Jeff Fields is a 61 y.o. male  Chief Complaint  Patient presents with   Hypertension   Coronary Artery Disease   Hyperlipidemia   Sinus Problem    Patient states for the past few months when he bends over he feels like his face gets stopped up.    UPPER RESPIRATORY TRACT INFECTION Duration: Chronic Worst symptom: congestion Fever: no Cough: no Shortness of breath: no Wheezing: no Chest pain: no Chest tightness: no Chest congestion: no Nasal congestion: yes Runny nose: no Post nasal drip: yes Sneezing: no Sore throat: no Swollen glands: no Sinus pressure: yes Headache: no Face pain: no Toothache: no Ear pain: no  Ear pressure: no  Eyes red/itching:no Eye drainage/crusting: no  Vomiting: no Rash: no Fatigue: no Sick contacts: no Strep contacts: no  Context: stable Recurrent sinusitis: no Relief with OTC cold/cough medications: no  Treatments attempted: anti-histamine   HYPERTENSION / HYPERLIPIDEMIA Satisfied with current treatment? yes Duration of hypertension: chronic BP monitoring frequency: not checking BP medication side effects: no Past BP meds: carvedilol, amlodipine Duration of hyperlipidemia: chronic Cholesterol medication side effects: no Cholesterol supplements: none Past cholesterol medications: crestor, zetia Medication compliance: excellent compliance Aspirin: yes Recent stressors: no Recurrent headaches: no Visual changes: no Palpitations: no Dyspnea: no Chest pain: no Lower extremity edema: no Dizzy/lightheaded: no  Relevant past medical, surgical, family and social history reviewed and updated as indicated. Interim medical history since our last visit reviewed. Allergies and medications reviewed and updated.  Review of Systems   Constitutional: Negative.   Respiratory: Negative.    Cardiovascular: Negative.   Gastrointestinal: Negative.   Musculoskeletal: Negative.   Neurological: Negative.   Psychiatric/Behavioral: Negative.      Per HPI unless specifically indicated above     Objective:    BP 130/72   Pulse 65   Temp 98 F (36.7 C)   Wt 194 lb 8 oz (88.2 kg)   SpO2 99%   BMI 27.91 kg/m   Wt Readings from Last 3 Encounters:  06/28/22 194 lb 8 oz (88.2 kg)  12/24/21 200 lb 11.2 oz (91 kg)  11/10/21 201 lb 12.8 oz (91.5 kg)    Physical Exam Vitals and nursing note reviewed.  Constitutional:      General: He is not in acute distress.    Appearance: Normal appearance. He is normal weight. He is not ill-appearing, toxic-appearing or diaphoretic.  HENT:     Head: Normocephalic and atraumatic.     Right Ear: External ear normal. There is no impacted cerumen.     Left Ear: External ear normal. There is no impacted cerumen.     Nose: Congestion present. No rhinorrhea.     Mouth/Throat:     Mouth: Mucous membranes are moist.     Pharynx: Oropharynx is clear. No oropharyngeal exudate or posterior oropharyngeal erythema.  Eyes:     General: No scleral icterus.       Right eye: No discharge.        Left eye: No discharge.     Extraocular Movements: Extraocular movements intact.     Conjunctiva/sclera: Conjunctivae normal.     Pupils: Pupils are equal,   round, and reactive to light.  Cardiovascular:     Rate and Rhythm: Normal rate and regular rhythm.     Pulses: Normal pulses.     Heart sounds: Normal heart sounds. No murmur heard.    No friction rub. No gallop.  Pulmonary:     Effort: Pulmonary effort is normal. No respiratory distress.     Breath sounds: Normal breath sounds. No stridor. No wheezing, rhonchi or rales.  Chest:     Chest wall: No tenderness.  Musculoskeletal:        General: Normal range of motion.     Cervical back: Normal range of motion and neck supple.  Skin:    General:  Skin is warm and dry.     Capillary Refill: Capillary refill takes less than 2 seconds.     Coloration: Skin is not jaundiced or pale.     Findings: No bruising, erythema, lesion or rash.  Neurological:     General: No focal deficit present.     Mental Status: He is alert and oriented to person, place, and time. Mental status is at baseline.  Psychiatric:        Mood and Affect: Mood normal.        Behavior: Behavior normal.        Thought Content: Thought content normal.        Judgment: Judgment normal.     Results for orders placed or performed in visit on 12/24/21  Comprehensive metabolic panel  Result Value Ref Range   Glucose 126 (H) 70 - 99 mg/dL   BUN 14 8 - 27 mg/dL   Creatinine, Ser 1.17 0.76 - 1.27 mg/dL   eGFR 71 >59 mL/min/1.73   BUN/Creatinine Ratio 12 10 - 24   Sodium 142 134 - 144 mmol/L   Potassium 4.7 3.5 - 5.2 mmol/L   Chloride 103 96 - 106 mmol/L   CO2 20 20 - 29 mmol/L   Calcium 9.5 8.6 - 10.2 mg/dL   Total Protein 7.3 6.0 - 8.5 g/dL   Albumin 4.9 3.8 - 4.9 g/dL   Globulin, Total 2.4 1.5 - 4.5 g/dL   Albumin/Globulin Ratio 2.0 1.2 - 2.2   Bilirubin Total 1.1 0.0 - 1.2 mg/dL   Alkaline Phosphatase 77 44 - 121 IU/L   AST 19 0 - 40 IU/L   ALT 21 0 - 44 IU/L  CBC with Differential/Platelet  Result Value Ref Range   WBC 11.0 (H) 3.4 - 10.8 x10E3/uL   RBC 4.49 4.14 - 5.80 x10E6/uL   Hemoglobin 14.4 13.0 - 17.7 g/dL   Hematocrit 40.8 37.5 - 51.0 %   MCV 91 79 - 97 fL   MCH 32.1 26.6 - 33.0 pg   MCHC 35.3 31.5 - 35.7 g/dL   RDW 12.6 11.6 - 15.4 %   Platelets 212 150 - 450 x10E3/uL   Neutrophils 86 Not Estab. %   Lymphs 11 Not Estab. %   Monocytes 3 Not Estab. %   Eos 0 Not Estab. %   Basos 0 Not Estab. %   Neutrophils Absolute 9.3 (H) 1.4 - 7.0 x10E3/uL   Lymphocytes Absolute 1.2 0.7 - 3.1 x10E3/uL   Monocytes Absolute 0.4 0.1 - 0.9 x10E3/uL   EOS (ABSOLUTE) 0.0 0.0 - 0.4 x10E3/uL   Basophils Absolute 0.0 0.0 - 0.2 x10E3/uL   Immature Granulocytes  0 Not Estab. %   Immature Grans (Abs) 0.0 0.0 - 0.1 x10E3/uL  Lipid Panel w/o Chol/HDL Ratio  Result Value  Ref Range   Cholesterol, Total 112 100 - 199 mg/dL   Triglycerides 66 0 - 149 mg/dL   HDL 46 >39 mg/dL   VLDL Cholesterol Cal 14 5 - 40 mg/dL   LDL Chol Calc (NIH) 52 0 - 99 mg/dL  PSA  Result Value Ref Range   Prostate Specific Ag, Serum 0.9 0.0 - 4.0 ng/mL  TSH  Result Value Ref Range   TSH 0.949 0.450 - 4.500 uIU/mL  Urinalysis, Routine w reflex microscopic  Result Value Ref Range   Specific Gravity, UA 1.010 1.005 - 1.030   pH, UA 6.5 5.0 - 7.5   Color, UA Yellow Yellow   Appearance Ur Clear Clear   Leukocytes,UA Negative Negative   Protein,UA Negative Negative/Trace   Glucose, UA Negative Negative   Ketones, UA Negative Negative   RBC, UA Negative Negative   Bilirubin, UA Negative Negative   Urobilinogen, Ur 0.2 0.2 - 1.0 mg/dL   Nitrite, UA Negative Negative  Microalbumin, Urine Waived  Result Value Ref Range   Microalb, Ur Waived 10 0 - 19 mg/L   Creatinine, Urine Waived 10 10 - 300 mg/dL   Microalb/Creat Ratio <30 <30 mg/g      Assessment & Plan:   Problem List Items Addressed This Visit       Cardiovascular and Mediastinum   CAD (coronary artery disease)    Under good control on current regimen. Continue current regimen. Continue to monitor. Call with any concerns. Refills given until he gets back into see cardiology in December. Labs drawn today.      Relevant Medications   rosuvastatin (CRESTOR) 5 MG tablet   ezetimibe (ZETIA) 10 MG tablet   carvedilol (COREG) 25 MG tablet   amLODipine (NORVASC) 10 MG tablet   Benign hypertension    Under good control on current regimen. Continue current regimen. Continue to monitor. Call with any concerns. Refills given until he gets back into see cardiology in December. Labs drawn today.       Relevant Medications   rosuvastatin (CRESTOR) 5 MG tablet   ezetimibe (ZETIA) 10 MG tablet   carvedilol (COREG) 25  MG tablet   amLODipine (NORVASC) 10 MG tablet   Other Relevant Orders   Comprehensive metabolic panel     Other   Hyperlipidemia    Under good control on current regimen. Continue current regimen. Continue to monitor. Call with any concerns. Refills given until he gets back into see cardiology in December. Labs drawn today.      Relevant Medications   rosuvastatin (CRESTOR) 5 MG tablet   ezetimibe (ZETIA) 10 MG tablet   carvedilol (COREG) 25 MG tablet   amLODipine (NORVASC) 10 MG tablet   Other Relevant Orders   Comprehensive metabolic panel   Lipid Panel w/o Chol/HDL Ratio   Nasal congestion - Primary    Chronic. Will start flonase. If not better in about 6 weeks consider referral to ENT.        Follow up plan: Return in about 6 months (around 12/28/2022) for physical.

## 2022-06-28 NOTE — Assessment & Plan Note (Signed)
Chronic. Will start flonase. If not better in about 6 weeks consider referral to ENT.

## 2022-06-28 NOTE — Assessment & Plan Note (Signed)
Under good control on current regimen. Continue current regimen. Continue to monitor. Call with any concerns. Refills given until he gets back into see cardiology in December. Labs drawn today.  

## 2022-06-29 LAB — LIPID PANEL W/O CHOL/HDL RATIO
Cholesterol, Total: 116 mg/dL (ref 100–199)
HDL: 47 mg/dL (ref >39)
LDL Chol Calc (NIH): 49 mg/dL (ref 0–99)
Triglycerides: 107 mg/dL (ref 0–149)
VLDL Cholesterol Cal: 20 mg/dL (ref 5–40)

## 2022-06-29 LAB — COMPREHENSIVE METABOLIC PANEL
ALT: 18 IU/L (ref 0–44)
AST: 22 IU/L (ref 0–40)
Albumin/Globulin Ratio: 2.6 — ABNORMAL HIGH (ref 1.2–2.2)
Albumin: 5 g/dL — ABNORMAL HIGH (ref 3.8–4.9)
Alkaline Phosphatase: 79 IU/L (ref 44–121)
BUN/Creatinine Ratio: 13 (ref 10–24)
BUN: 16 mg/dL (ref 8–27)
Bilirubin Total: 1 mg/dL (ref 0.0–1.2)
CO2: 24 mmol/L (ref 20–29)
Calcium: 9.5 mg/dL (ref 8.6–10.2)
Chloride: 102 mmol/L (ref 96–106)
Creatinine, Ser: 1.21 mg/dL (ref 0.76–1.27)
Globulin, Total: 1.9 g/dL (ref 1.5–4.5)
Glucose: 85 mg/dL (ref 70–99)
Potassium: 4.1 mmol/L (ref 3.5–5.2)
Sodium: 141 mmol/L (ref 134–144)
Total Protein: 6.9 g/dL (ref 6.0–8.5)
eGFR: 69 mL/min/{1.73_m2} (ref >59)

## 2022-07-08 ENCOUNTER — Ambulatory Visit: Payer: Self-pay

## 2022-07-08 NOTE — Telephone Encounter (Signed)
   Chief Complaint: medication request: lidocaine 2% solution Symptoms: sore throat Frequency: symptoms started Tuesday Pertinent Negatives: Patient denies fever Disposition: [] ED /[] Urgent Care (no appt availability in office) / [] Appointment(In office/virtual)/ []  [facility]/ [x] Home Care/ [] Refused Recommended Disposition /[] [facility]/ []  Follow-up with PCP Additional Notes: Patient is requesting Rx for sore throat   Reason for Disposition  [1] Sore throat is the only symptom AND [2] sore throat present < 48 hours  Answer Assessment - Initial Assessment Questions 1. ONSET: "When did the throat start hurting?" (Hours or days ago)      Tuesday- afternoon 2. SEVERITY: "How bad is the sore throat?" (Scale 1-10; mild, moderate or severe)   - MILD (1-3):  Doesn't interfere with eating or normal activities.   - MODERATE (4-7): Interferes with eating some solids and normal activities.   - SEVERE (8-10):  Excruciating pain, interferes with most normal activities.   - SEVERE WITH DYSPHAGIA (10): Can't swallow liquids, drooling.     moderate 3. STREP EXPOSURE: "Has there been any exposure to strep within the past week?" If Yes, ask: "What type of contact occurred?"      no 4.  VIRAL SYMPTOMS: "Are there any symptoms of a cold, such as a runny nose, cough, hoarse voice or red eyes?"      Sinus drainage 5. FEVER: "Do you have a fever?" If Yes, ask: "What is your temperature, how was it measured, and when did it start?"     no 6. PUS ON THE TONSILS: "Is there pus on the tonsils in the back of your throat?"     N/a 7. OTHER SYMPTOMS: "Do you have any other symptoms?" (e.g., difficulty breathing, headache, rash)     no 8. PREGNANCY: "Is there any chance you are pregnant?" "When was your last menstrual period?"  Protocols used: Sore Throat-A-AH

## 2022-07-08 NOTE — Telephone Encounter (Signed)
LVM asking patient to call back to schedule an appointment with one of our providers. Jeff Fields has appointments tomorrow.

## 2022-07-08 NOTE — Telephone Encounter (Signed)
Summary: Sore throat, Rx request   Pt called requesting to receive liquid lidocaine for his sore throat, says she has done this for him previously.   [name] [address], [place] - [place]  [place] [address]  Phone: [phone] Fax: [phone]     Called pt [name]

## 2022-07-27 ENCOUNTER — Other Ambulatory Visit: Payer: Self-pay | Admitting: Cardiovascular Disease

## 2022-08-16 ENCOUNTER — Other Ambulatory Visit: Payer: Self-pay | Admitting: Cardiovascular Disease

## 2022-08-16 DIAGNOSIS — I25118 Atherosclerotic heart disease of native coronary artery with other forms of angina pectoris: Secondary | ICD-10-CM

## 2022-08-30 NOTE — Progress Notes (Unsigned)
Patient Jeff: Jeff Fields, male   DOB: 1961-04-24, 61 y.o.   MRN: [id number] Cardiology Office Note  Date:  08/31/2022   Jeff:  SOURISH Fields, DOB 1961/03/06, MRN [id number]  PCP:  Dorcas Carrow, DO   Chief Complaint  Patient presents with   12 month follow up     "Doing well." Medications reviewed by the patient verbally.     HPI:  Jeff Fields is a 61 year old gentleman with history of remote smoking history,  initially presenting with chest pain on exertion,    stress test that showed anterior wall ischemia, cardiac catheterization and stent placement to his mid LAD 11/08/14.  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   PMH:   has a past medical history of Coronary artery disease, Hyperlipidemia, and Hypertension. Also history of coronary artery disease, prior stent  PSH:    Past Surgical History:  Procedure Laterality Date   CARDIAC CATHETERIZATION  11/08/2014   COLONOSCOPY WITH PROPOFOL N/A 10/09/2021   Procedure: COLONOSCOPY WITH PROPOFOL;  Surgeon: Wyline Mood, MD;  Location: [facility] ENDOSCOPY;  Service: Gastroenterology;  Laterality: N/A;   CORONARY ANGIOPLASTY WITH STENT PLACEMENT  11/08/2014   drug eluting  stent placement to the mid LAD   VASECTOMY  2000    Current Outpatient Medications  Medication Sig Dispense Refill   aspirin 81 MG tablet Take 81 mg by mouth daily.     Coenzyme Q10 (COQ-10) 200 MG CAPS Take 200 mg by mouth daily.     cyanocobalamin 1000 MCG tablet Take 1,000 mcg by mouth daily.     cyclobenzaprine (FLEXERIL) 10 MG tablet Take 1 tablet by mouth three times daily as needed for muscle spasm 30 tablet 0   fluticasone (FLONASE) 50 MCG/ACT nasal spray Place 2 sprays into both nostrils daily. 16 g 6   meloxicam (MOBIC) 15 MG tablet TAKE 1 TABLET BY MOUTH ONCE DAILY AS NEEDED FOR PAIN 90 tablet 1   traZODone (DESYREL) 50 MG tablet TAKE 1/2 TO 1 (ONE-HALF TO ONE) TABLET BY MOUTH AT BEDTIME AS NEEDED FOR SLEEP 90 tablet 1   amLODipine (NORVASC) 10 MG tablet Take 1 tablet (10 mg total) by mouth daily. 90 tablet 3   carvedilol (COREG) 25 MG tablet Take 1 tablet (25 mg total) by mouth 2 (two) times daily. 180 tablet 3   ezetimibe (ZETIA) 10 MG tablet Take 1 tablet (10 mg total) by mouth daily. 90 tablet 3   rosuvastatin (CRESTOR) 5 MG tablet Take 1 tablet (5 mg total) by mouth daily. 90 tablet 3   No current facility-administered medications for this visit.    Allergies:   Patient has no known allergies.   Social History:  The patient  reports that he quit smoking about  33 years ago. His smoking use included cigarettes. He smoked an average of 1 pack per day. He has never used smokeless tobacco. He reports current alcohol use. He reports that he does not use drugs.   Family History:   family history includes Arrhythmia in his father; Arthritis in his brother; Hypertension in his brother, father, and mother; Lung cancer in his maternal grandfather; Stroke in his maternal grandmother.    Review of Systems: Review of Systems  Constitutional: Negative.   HENT: Negative.    Respiratory: Negative.    Cardiovascular: Negative.   Gastrointestinal: Negative.   Musculoskeletal:  Negative.   Skin: Negative.   Neurological: Negative.   Psychiatric/Behavioral: Negative.    All other systems reviewed and are negative.   PHYSICAL EXAM: VS:  BP 130/70 (BP Location: Left Arm, Patient Position: Sitting, Cuff Size: Normal)   Pulse (!) 57   Ht 5\' 10"  (1.778 m)   Wt 192 lb (87.1 kg)   SpO2 98%   BMI 27.55 kg/m  , BMI Body mass index is 27.55 kg/m. Constitutional:  oriented to person, place, and time. No distress.  HENT:  Head: Grossly normal Eyes:  no discharge. No scleral icterus.  Neck: No JVD, no carotid bruits  Cardiovascular: Regular rate and rhythm, no murmurs appreciated Pulmonary/Chest: Clear to auscultation bilaterally, no wheezes or rails Abdominal: Soft.  no distension.  no tenderness.  Musculoskeletal: Normal range of motion Neurological:  normal muscle tone. Coordination normal. No atrophy Skin: Skin warm and dry Psychiatric: normal affect, pleasant  Recent Labs: 12/24/2021: Hemoglobin 14.4; Platelets 212; TSH 0.949 06/28/2022: ALT 18; BUN 16; Creatinine, Ser 1.21; Potassium 4.1; Sodium 141    Lipid Panel Lab Results  Component Value Date   CHOL 116 06/28/2022   HDL 47 06/28/2022   LDLCALC 49 06/28/2022   TRIG 107 06/28/2022      Wt Readings from Last 3 Encounters:  08/31/22 192 lb (87.1 kg)  06/28/22 194 lb 8 oz (88.2 kg)  12/24/21 200 lb 11.2 oz (91 kg)     ASSESSMENT AND PLAN:  Coronary artery disease involving native coronary artery of native heart without angina pectoris Discussed prior stress test March 2022 Currently with no symptoms of angina. No further workup at this time. Continue current medication regimen.  Hyperlipidemia on Crestor 5 daily, Zetia 10 mg daily  Cholesterol at goal  Essential hypertension Blood pressure is well controlled on today's visit. No changes made to the medications.  S/P coronary artery stent placement Denies anginal symptoms   Total encounter time more than 30 minutes  Greater than 50%  was spent in counseling and coordination of care with the patient   Orders Placed This Encounter  Procedures   EKG 12-Lead     Signed, April 2022, M.D., Ph.D. 08/31/2022  [facility] [place], [place]

## 2022-08-31 ENCOUNTER — Ambulatory Visit: Payer: 59 | Attending: Cardiovascular Disease | Admitting: Cardiovascular Disease

## 2022-08-31 ENCOUNTER — Encounter: Payer: Self-pay | Admitting: Cardiovascular Disease

## 2022-08-31 VITALS — BP 130/70 | HR 57 | Ht 70.0 in | Wt 192.0 lb

## 2022-08-31 DIAGNOSIS — R079 Chest pain, unspecified: Secondary | ICD-10-CM

## 2022-08-31 DIAGNOSIS — I10 Essential (primary) hypertension: Secondary | ICD-10-CM

## 2022-08-31 DIAGNOSIS — Z87891 Personal history of nicotine dependence: Secondary | ICD-10-CM

## 2022-08-31 DIAGNOSIS — I25118 Atherosclerotic heart disease of native coronary artery with other forms of angina pectoris: Secondary | ICD-10-CM

## 2022-08-31 DIAGNOSIS — E782 Mixed hyperlipidemia: Secondary | ICD-10-CM | POA: Diagnosis not present

## 2022-08-31 DIAGNOSIS — I998 Other disorder of circulatory system: Secondary | ICD-10-CM

## 2022-08-31 MED ORDER — EZETIMIBE 10 MG PO TABS: 10.0000 mg | ORAL_TABLET | Freq: Every day | ORAL | 3 refills | Status: DC

## 2022-08-31 MED ORDER — AMLODIPINE BESYLATE 10 MG PO TABS: 10.0000 mg | ORAL_TABLET | Freq: Every day | ORAL | 3 refills | Status: DC

## 2022-08-31 MED ORDER — ROSUVASTATIN CALCIUM 5 MG PO TABS: 5.0000 mg | ORAL_TABLET | Freq: Every day | ORAL | 3 refills | Status: DC

## 2022-08-31 MED ORDER — CARVEDILOL 25 MG PO TABS: 25.0000 mg | ORAL_TABLET | Freq: Two times a day (BID) | ORAL | 3 refills | Status: DC

## 2022-08-31 NOTE — Patient Instructions (Signed)
Medication Instructions:  No changes  If you need a refill on your cardiac medications before your next appointment, please call your pharmacy.   Lab work: No new labs needed  Testing/Procedures: No new testing needed  Follow-Up: At [facility], you and your health needs are our priority.  As part of our continuing mission to provide you with exceptional heart care, we have created designated Provider Care Teams.  These Care Teams include your primary Cardiologist (physician) and Advanced Practice Providers (APPs -  Physician Assistants and Nurse Practitioners) who all work together to provide you with the care you need, when you need it.  You will need a follow up appointment in 12 months  Providers on your designated Care Team:   Christopher Berge, NP Ryan Dunn, PA-C Cadence Furth, PA-C  COVID-19 Vaccine Information can be found at: [email or web address] For questions related to vaccine distribution or appointments, please email [email or web address] or call [phone].   

## 2022-09-01 ENCOUNTER — Telehealth: Payer: Self-pay | Admitting: Cardiovascular Disease

## 2022-09-01 DIAGNOSIS — I25118 Atherosclerotic heart disease of native coronary artery with other forms of angina pectoris: Secondary | ICD-10-CM

## 2022-09-01 DIAGNOSIS — E782 Mixed hyperlipidemia: Secondary | ICD-10-CM

## 2022-09-01 MED ORDER — EZETIMIBE 10 MG PO TABS: 10.0000 mg | ORAL_TABLET | Freq: Every day | ORAL | 3 refills | Status: DC

## 2022-09-01 MED ORDER — ROSUVASTATIN CALCIUM 5 MG PO TABS: 5.0000 mg | ORAL_TABLET | Freq: Every day | ORAL | 3 refills | Status: DC

## 2022-09-01 NOTE — Telephone Encounter (Signed)
*  STAT* If patient is at the pharmacy, call can be transferred to refill team.   1. Which medications need to be refilled? (please list name of each medication and dose if known) ezetimibe (ZETIA) 10 MG tablet   2. Which pharmacy/location (including street and city if local pharmacy) is medication to be sent to? [id number] - [place], [place] - [address]   3. Do they need a 30 day or 90 day supply? 90    *STAT* If patient is at the pharmacy, call can be transferred to refill team.   1. Which medications need to be refilled? (please list name of each medication and dose if known) rosuvastatin (CRESTOR) 5 MG tablet   2. Which pharmacy/location (including street and city if local pharmacy) is medication to be sent to? [facility] - [place], [place] - [address] [facility] Portal to Registered [facility] Sites   3. Do they need a 30 day or 90 day supply? 90

## 2022-09-01 NOTE — Telephone Encounter (Signed)
  Requested Prescriptions   Signed Prescriptions Disp Refills   ezetimibe (ZETIA) 10 MG tablet 90 tablet 3    Sig: Take 1 tablet (10 mg total) by mouth daily.    Authorizing Provider: Antonieta Iba    Ordering User: Iverson Alamin C   rosuvastatin (CRESTOR) 5 MG tablet 90 tablet 3    Sig: Take 1 tablet (5 mg total) by mouth daily.    Authorizing Provider: Antonieta Iba    Ordering User: Kendrick Fries

## 2022-09-17 ENCOUNTER — Encounter: Payer: Self-pay | Admitting: Physician Assistant

## 2022-09-17 ENCOUNTER — Ambulatory Visit: Payer: 59 | Admitting: Physician Assistant

## 2022-09-17 VITALS — BP 126/75 | HR 54 | Temp 98.8°F | Ht 70.0 in | Wt 190.7 lb

## 2022-09-17 DIAGNOSIS — K5732 Diverticulitis of large intestine without perforation or abscess without bleeding: Secondary | ICD-10-CM

## 2022-09-17 MED ORDER — CIPROFLOXACIN HCL 500 MG PO TABS: 500.0000 mg | ORAL_TABLET | Freq: Two times a day (BID) | ORAL | 0 refills | Status: AC

## 2022-09-17 MED ORDER — METRONIDAZOLE 500 MG PO TABS: 500.0000 mg | ORAL_TABLET | Freq: Two times a day (BID) | ORAL | 0 refills | Status: DC

## 2022-09-17 NOTE — Progress Notes (Unsigned)
Acute Office Visit   Patient: Jeff Fields   DOB: 13-May-1961   62 y.o. Male  MRN: [id number] Visit Date: 09/17/2022  Today's healthcare provider: Dani Gobble Jhalen Eley, PA-C  Introduced myself to the patient as a Journalist, newspaper and provided education on APPs in clinical practice.    Chief Complaint  Patient presents with   Diverticulitis    Patient says he has recently gotten over a horrible stomach bug and he thinks the stomach bug may have triggered a flare with diverticulitis. Patient says tender to touch or bend over.    Subjective    HPI HPI     Diverticulitis    Additional comments: Patient says he has recently gotten over a horrible stomach bug and he thinks the stomach bug may have triggered a flare with diverticulitis. Patient says tender to touch or bend over.       Last edited by Irena Reichmann, [place] on 09/17/2022  3:06 PM.      Reports pain in left lower abdomen that feels similar to when he had diverticulitis  He reports he is recovering from a stomach bug where he was having nausea, vomiting, diarrhea but this has resolved Reports the stomach bug lasted for about a week but he is feeling better minus the lingering LLQ abdominal pain  Reports stomach bug seemed to have cleared on Sun- last bout of diarrhea was that day  He reports normal, daily bowel movements for the past 5 days now  Pain level and character: 2-3/10 and more uncomfortable than anything Reports bending or laying on left side is aggravating   Medications: Outpatient Medications Prior to Visit  Medication Sig   amLODipine (NORVASC) 10 MG tablet Take 1 tablet (10 mg total) by mouth daily.   aspirin 81 MG tablet Take 81 mg by mouth daily.   carvedilol (COREG) 25 MG tablet Take 1 tablet (25 mg total) by mouth 2 (two) times daily.   Coenzyme Q10 (COQ-10) 200 MG CAPS Take 200 mg by mouth daily.   cyanocobalamin 1000 MCG tablet Take 1,000 mcg by mouth daily.   cyclobenzaprine (FLEXERIL) 10 MG tablet Take 1  tablet by mouth three times daily as needed for muscle spasm   ezetimibe (ZETIA) 10 MG tablet Take 1 tablet (10 mg total) by mouth daily.   fluticasone (FLONASE) 50 MCG/ACT nasal spray Place 2 sprays into both nostrils daily.   meloxicam (MOBIC) 15 MG tablet TAKE 1 TABLET BY MOUTH ONCE DAILY AS NEEDED FOR PAIN   rosuvastatin (CRESTOR) 5 MG tablet Take 1 tablet (5 mg total) by mouth daily.   traZODone (DESYREL) 50 MG tablet TAKE 1/2 TO 1 (ONE-HALF TO ONE) TABLET BY MOUTH AT BEDTIME AS NEEDED FOR SLEEP   No facility-administered medications prior to visit.    Review of Systems  Constitutional:  Negative for chills, fatigue and fever.  Gastrointestinal:  Positive for abdominal pain. Negative for abdominal distention, blood in stool, diarrhea, nausea and vomiting.  Neurological:  Negative for dizziness and headaches.    {Labs  Heme  Chem  Endocrine  Serology  Results Review (optional):23779}   Objective    BP 126/75   Pulse (!) 54   Temp 98.8 F (37.1 C) (Oral)   Ht 5\' 10"  (1.778 m)   Wt 190 lb 11.2 oz (86.5 kg)   SpO2 98%   BMI 27.36 kg/m  {Show previous vital signs (optional):23777}  Physical Exam Vitals reviewed.  Constitutional:  General: He is awake.     Appearance: Normal appearance. He is well-developed and well-groomed.  HENT:     Head: Normocephalic and atraumatic.     Mouth/Throat:     Mouth: Mucous membranes are moist.     Pharynx: Oropharynx is clear.  Eyes:     Extraocular Movements: Extraocular movements intact.     Conjunctiva/sclera: Conjunctivae normal.  Pulmonary:     Effort: Pulmonary effort is normal.  Abdominal:     General: Abdomen is flat. Bowel sounds are increased.     Palpations: Abdomen is soft.     Tenderness: There is abdominal tenderness in the left lower quadrant.  Musculoskeletal:     Cervical back: Normal range of motion and neck supple.  Skin:    General: Skin is warm and dry.  Neurological:     General: No focal deficit  present.     Mental Status: He is alert and oriented to person, place, and time.  Psychiatric:        Mood and Affect: Mood normal.        Behavior: Behavior normal. Behavior is cooperative.        Thought Content: Thought content normal.        Judgment: Judgment normal.       No results found for any visits on 09/17/22.  Assessment & Plan      No follow-ups on file.

## 2022-09-17 NOTE — Progress Notes (Deleted)
Established Patient Office Visit  Name: Jeff Fields   MRN: [id number]    DOB: [birth year]   Date:09/17/2022  Today's Provider: Talitha Givens, MHS, PA-C Introduced myself to the patient as a PA-C and provided education on APPs in clinical practice.         Subjective  Chief Complaint  No chief complaint on file.   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    Past Surgical History:  Procedure Laterality Date   CARDIAC CATHETERIZATION  11/08/2014   COLONOSCOPY WITH PROPOFOL N/A 10/09/2021   Procedure: COLONOSCOPY WITH PROPOFOL;  Surgeon: Jonathon Bellows, MD;  Location[facility] ENDOSCOPY;  Service: Gastroenterology;  Laterality: N/A;   CORONARY ANGIOPLASTY WITH STENT PLACEMENT  11/08/2014   drug eluting stent placement to the mid LAD   VASECTOMY  2000    Family History  Problem Relation Age of Onset   Hypertension Mother    Hypertension Father    Arrhythmia Father        Pacemaker implant   Hypertension Brother    Arthritis Brother    Stroke Maternal Grandmother    Lung cancer Maternal Grandfather     Social History   Tobacco Use   Smoking status: Former    Packs/day: 1.00    Types: Cigarettes    Quit date: 09/13/1988    Years since quitting: 34.0   Smokeless tobacco: Never  Substance Use Topics   Alcohol use: Yes    Alcohol/week: 0.0 standard drinks of alcohol    Comment: Rarely     Current Outpatient Medications:    amLODipine (NORVASC) 10 MG tablet, Take 1 tablet (10 mg total) by mouth  daily., Disp: 90 tablet, Rfl: 3   aspirin 81 MG tablet, Take 81 mg by mouth daily., Disp: , Rfl:    carvedilol (COREG) 25 MG tablet, Take 1 tablet (25 mg total) by mouth 2 (two) times daily., Disp: 180 tablet, Rfl: 3   Coenzyme Q10 (COQ-10) 200 MG CAPS, Take 200 mg by mouth daily., Disp: , Rfl:    cyanocobalamin 1000 MCG tablet, Take 1,000 mcg by mouth daily., Disp: , Rfl:    cyclobenzaprine (FLEXERIL) 10 MG tablet, Take 1 tablet by mouth three times daily as needed for muscle spasm, Disp: 30 tablet, Rfl: 0   ezetimibe (ZETIA) 10 MG tablet, Take 1 tablet (10 mg total) by mouth daily., Disp: 90 tablet, Rfl: 3   fluticasone (FLONASE) 50 MCG/ACT nasal spray, Place 2 sprays into both nostrils daily., Disp: 16 g, Rfl: 6   meloxicam (MOBIC) 15 MG tablet, TAKE 1 TABLET BY MOUTH ONCE DAILY AS NEEDED FOR PAIN, Disp: 90 tablet, Rfl: 1   rosuvastatin (CRESTOR) 5 MG tablet, Take 1 tablet (5 mg total) by mouth daily., Disp: 90 tablet, Rfl: 3   traZODone (DESYREL) 50 MG tablet, TAKE 1/2 TO 1 (ONE-HALF TO ONE) TABLET BY MOUTH AT BEDTIME AS NEEDED FOR SLEEP, Disp: 90 tablet, Rfl: 1  No Known Allergies  I personally reviewed {Reviewed:14835}  with the patient/caregiver today.   ROS    Objective  There were no vitals filed for this visit.  There is no height or weight on file to calculate BMI.  Physical Exam   Recent Results (from the past 2160 hour(s))  Comprehensive metabolic panel     Status: Abnormal   Collection Time: 06/28/22  4:14 PM  Result Value Ref Range   Glucose 85 70 - 99 mg/dL   BUN 16 8 - 27 mg/dL   Creatinine, Ser 1.21 0.76 - 1.27 mg/dL   eGFR 69 >59 mL/min/1.73   BUN/Creatinine Ratio 13 10 - 24   Sodium 141 134 - 144 mmol/L   Potassium 4.1 3.5 - 5.2 mmol/L   Chloride 102 96 - 106 mmol/L   CO2 24 20 - 29 mmol/L   Calcium 9.5 8.6 - 10.2 mg/dL   Total Protein 6.9 6.0 - 8.5 g/dL   Albumin 5.0 (H) 3.8 - 4.9 g/dL   Globulin, Total 1.9 1.5 - 4.5 g/dL   Albumin/Globulin Ratio 2.6  (H) 1.2 - 2.2   Bilirubin Total 1.0 0.0 - 1.2 mg/dL   Alkaline Phosphatase 79 44 - 121 IU/L   AST 22 0 - 40 IU/L   ALT 18 0 - 44 IU/L  Lipid Panel w/o Chol/HDL Ratio     Status: None   Collection Time: 06/28/22  4:14 PM  Result Value Ref Range   Cholesterol, Total 116 100 - 199 mg/dL   Triglycerides 107 0 - 149 mg/dL   HDL 47 >39 mg/dL   VLDL Cholesterol Cal 20 5 - 40 mg/dL   LDL Chol Calc (NIH) 49 0 - 99 mg/dL     PHQ2/9:    06/28/2022    3:53 PM 12/24/2021    3:47 PM 11/10/2021    2:22 PM 09/01/2021    4:00 PM 08/18/2021    1:05 PM  Depression screen PHQ 2/9  Decreased Interest 0 0 0 0 0  Down, Depressed, Hopeless 0 0 0 0 0  PHQ - 2 Score 0 0 0 0 0  Altered sleeping 1 1 2 1 1   Tired, decreased energy 1 1 2 1 2   Change in appetite 0 0 0 0 0  Feeling bad or failure about yourself  0 0 0 0 0  Trouble concentrating 0 0 0 0 0  Moving slowly or fidgety/restless 0 0 0 0 0  Suicidal thoughts 0 0 0 0 0  PHQ-9 Score 2 2 4 2 3   Difficult doing work/chores    Not difficult at all       Fall Risk:    06/28/2022    3:53 PM 12/24/2021    3:47 PM 09/01/2021    3:59 PM 01/01/2019    9:22 AM  Fall Risk   Falls in the past year? 0 0 0 0  Number falls in past yr: 0 0 0 0  Injury with Fall? 0 0 0 0  Risk for fall due to : No Fall Risks No Fall Risks No Fall Risks   Follow up Falls evaluation completed Falls evaluation completed Falls evaluation completed       Functional Status Survey:      Assessment & Plan

## 2022-10-02 ENCOUNTER — Other Ambulatory Visit: Payer: Self-pay | Admitting: Family Medicine

## 2022-10-04 NOTE — Telephone Encounter (Signed)
Requested medication (s) are due for refill today: yes  Requested medication (s) are on the active medication list: yes  Last refill:  05/05/22 #30/0   Future visit scheduled: yes  Notes to clinic:  Unable to refill per protocol, cannot delegate.      Requested Prescriptions  Pending Prescriptions Disp Refills   cyclobenzaprine (FLEXERIL) 10 MG tablet [Pharmacy Med Name: Cyclobenzaprine HCl 10 MG Oral Tablet] 30 tablet 0    Sig: Take 1 tablet by mouth three times daily as needed for muscle spasm     Not Delegated - Analgesics:  Muscle Relaxants Failed - 10/02/2022 12:21 PM      Failed - This refill cannot be delegated      Passed - Valid encounter within last 6 months    Recent Outpatient Visits           2 weeks ago Diverticulitis of descending colon   [place], PA-C   3 months ago Nasal congestion   [place], [place], DO   9 months ago Routine general medical examination at a health care facility   [facility], Megan P, DO   10 months ago Rib pain on right side   [place] Kathrine Haddock, NP   1 year ago Other intervertebral disc degeneration, thoracic region   [place], [place], DO       Future Appointments             In 2 months Wynetta Emery, Barb Merino, DO [place]

## 2022-12-21 ENCOUNTER — Other Ambulatory Visit: Payer: Self-pay | Admitting: Family Medicine

## 2022-12-22 NOTE — Telephone Encounter (Signed)
Requested medication (s) are due for refill today: Due 12/28/22  Requested medication (s) are on the active medication list: yes    Last refill: Trazodone and Meloxicam 06/28/22  #90  1 refill   Flexeril 10/04/22  #30  0 refills  Future visit scheduled  yes  Notes to clinic:   Pt has supply of meds until appt 12/28/22. Please review.  Flexeril non delegated. Thank you.  Requested Prescriptions  Pending Prescriptions Disp Refills   meloxicam (MOBIC) 15 MG tablet [Pharmacy Med Name: Meloxicam 15 MG Oral Tablet] 90 tablet 0    Sig: TAKE 1 TABLET BY MOUTH ONCE DAILY AS NEEDED FOR PAIN     Analgesics:  COX2 Inhibitors Failed - 12/21/2022 10:17 PM      Failed - Manual Review: Labs are only required if the patient has taken medication for more than 8 weeks.      Failed - HGB in normal range and within 360 days    Hemoglobin  Date Value Ref Range Status  12/24/2021 14.4 13.0 - 17.7 g/dL Final         Failed - HCT in normal range and within 360 days    Hematocrit  Date Value Ref Range Status  12/24/2021 40.8 37.5 - 51.0 % Final         Passed - Cr in normal range and within 360 days    Creatinine, Ser  Date Value Ref Range Status  06/28/2022 1.21 0.76 - 1.27 mg/dL Final         Passed - AST in normal range and within 360 days    AST  Date Value Ref Range Status  06/28/2022 22 0 - 40 IU/L Final         Passed - ALT in normal range and within 360 days    ALT  Date Value Ref Range Status  06/28/2022 18 0 - 44 IU/L Final         Passed - eGFR is 30 or above and within 360 days    GFR calc Af Amer  Date Value Ref Range Status  03/04/2020 91 >59 mL/min/1.73 Final    Comment:    **[facility] currently reports eGFR in compliance with the current**   recommendations of the [employer]. [facility] will   update reporting as new guidelines are published from the NKF-ASN   Task force.    GFR, Estimated  Date Value Ref Range Status  07/31/2021 >60 >60 mL/min Final     Comment:    (NOTE) Calculated using the CKD-EPI Creatinine Equation (2021)    eGFR  Date Value Ref Range Status  06/28/2022 69 >59 mL/min/1.73 Final         Passed - Patient is not pregnant      Passed - Valid encounter within last 12 months    Recent Outpatient Visits           3 months ago Diverticulitis of descending colon   [place] [facility] Mecum, Oswaldo Conroy, PA-C   5 months ago Nasal congestion   [place] [facility] [place], Megan P, DO   12 months ago Routine general medical examination at a health care facility   [facility] [place], [place] P, DO   1 year ago Rib pain on right side   [place] [facility] Gabriel Cirri, NP   1 year ago Other intervertebral disc degeneration, thoracic region   [facility],  Oralia Rud, DO       Future Appointments             In 6 days Johnson, Megan P, DO [place] [facility]             traZODone (DESYREL) 50 MG tablet [Pharmacy Med Name: traZODone HCl 50 MG Oral Tablet] 90 tablet 0    Sig: TAKE 1/2 TO 1 (ONE-HALF TO ONE) TABLET BY MOUTH AT BEDTIME AS NEEDED FOR SLEEP     Psychiatry: Antidepressants - Serotonin Modulator Passed - 12/21/2022 10:17 PM      Passed - Valid encounter within last 6 months    Recent Outpatient Visits           3 months ago Diverticulitis of descending colon   [place] [facility] Mecum, Oswaldo Conroy, PA-C   5 months ago Nasal congestion   [place] [facility] [place], Megan P, DO   12 months ago Routine general medical examination at a health care facility   [facility] [place], [place] P, DO   1 year ago Rib pain on right side   [place] [facility] Gabriel Cirri, NP   1 year ago Other intervertebral disc degeneration, thoracic region   [facility] [place], [place], DO        Future Appointments             In 6 days Laural Benes, Oralia Rud, DO [place] [facility]             cyclobenzaprine (FLEXERIL) 10 MG tablet [Pharmacy Med Name: Cyclobenzaprine HCl 10 MG Oral Tablet] 30 tablet 0    Sig: Take 1 tablet by mouth three times daily as needed for muscle spasm     Not Delegated - Analgesics:  Muscle Relaxants Failed - 12/21/2022 10:17 PM      Failed - This refill cannot be delegated      Passed - Valid encounter within last 6 months    Recent Outpatient Visits           3 months ago Diverticulitis of descending colon   [place] [facility] Mecum, Oswaldo Conroy, PA-C   5 months ago Nasal congestion   [place] [facility] [place], Megan P, DO   12 months ago Routine general medical examination at a health care facility   [facility] [place], [place] P, DO   1 year ago Rib pain on right side   [place] [facility] Gabriel Cirri, NP   1 year ago Other intervertebral disc degeneration, thoracic region   [facility] Dorcas Carrow, DO       Future Appointments             In 6 days Dorcas Carrow, DO [place] [facility]

## 2022-12-28 ENCOUNTER — Encounter: Payer: 59 | Admitting: Family Medicine

## 2023-02-25 ENCOUNTER — Ambulatory Visit: Payer: 59 | Admitting: Physician Assistant

## 2023-02-25 VITALS — BP 132/76 | HR 49 | Temp 98.8°F | Wt 192.2 lb

## 2023-02-25 DIAGNOSIS — M546 Pain in thoracic spine: Secondary | ICD-10-CM | POA: Diagnosis not present

## 2023-02-25 DIAGNOSIS — M545 Low back pain, unspecified: Secondary | ICD-10-CM | POA: Diagnosis not present

## 2023-02-25 MED ORDER — CYCLOBENZAPRINE HCL 10 MG PO TABS
10.0000 mg | ORAL_TABLET | Freq: Three times a day (TID) | ORAL | 0 refills | Status: DC | PRN
Start: 2023-02-25 — End: 2023-08-26

## 2023-02-25 MED ORDER — DICLOFENAC SODIUM 75 MG PO TBEC
75.0000 mg | DELAYED_RELEASE_TABLET | Freq: Two times a day (BID) | ORAL | 0 refills | Status: DC
Start: 2023-02-25 — End: 2023-03-15

## 2023-02-25 NOTE — Progress Notes (Signed)
Acute Office Visit   Patient: Jeff Fields   DOB: 12/20/1960   62 y.o. Male  MRN: [id number] Visit Date: 02/25/2023  Today's healthcare provider: Oswaldo Conroy Eean Buss, PA-C  Introduced myself to the patient as a Secondary school teacher and provided education on APPs in clinical practice.    Chief Complaint  Patient presents with  . Back Pain   Subjective    HPI    Onset: gradual - he reports previous instances of back pain and arthritis  Duration: 2 weeks  Location: Lower back and thoracic  Radiation: none  Pain level and character: currently 4-5/10 with burning pain Other associated symptoms: he denies numbness or tingling, saddle anesthesia, weakness  He reports some stiffness in his hands in the AM  Interventions: Meloxicam  Alleviating: resting  Aggravating: bending and daily work      Medications: Outpatient Medications Prior to Visit  Medication Sig  . amLODipine (NORVASC) 10 MG tablet Take 1 tablet (10 mg total) by mouth daily.  Marland Kitchen aspirin 81 MG tablet Take 81 mg by mouth daily.  . carvedilol (COREG) 25 MG tablet Take 1 tablet (25 mg total) by mouth 2 (two) times daily.  . Coenzyme Q10 (COQ-10) 200 MG CAPS Take 200 mg by mouth daily.  . cyanocobalamin 1000 MCG tablet Take 1,000 mcg by mouth daily.  Marland Kitchen ezetimibe (ZETIA) 10 MG tablet Take 1 tablet (10 mg total) by mouth daily.  . fluticasone (FLONASE) 50 MCG/ACT nasal spray Place 2 sprays into both nostrils daily.  . metroNIDAZOLE (FLAGYL) 500 MG tablet Take 1 tablet (500 mg total) by mouth 2 (two) times daily.  . rosuvastatin (CRESTOR) 5 MG tablet Take 1 tablet (5 mg total) by mouth daily.  . traZODone (DESYREL) 50 MG tablet TAKE 1/2 TO 1 (ONE-HALF TO ONE) TABLET BY MOUTH AT BEDTIME AS NEEDED FOR SLEEP  . [DISCONTINUED] cyclobenzaprine (FLEXERIL) 10 MG tablet Take 1 tablet by mouth three times daily as needed for muscle spasm  . [DISCONTINUED] meloxicam (MOBIC) 15 MG tablet TAKE 1 TABLET BY MOUTH ONCE DAILY AS NEEDED FOR PAIN   No  facility-administered medications prior to visit.    Review of Systems  Musculoskeletal:  Positive for back pain. Negative for gait problem and myalgias.  Neurological:  Negative for weakness and numbness.    {Labs  Heme  Chem  Endocrine  Serology  Results Review (optional):23779}   Objective    BP 132/76   Pulse (!) 49   Temp 98.8 F (37.1 C) (Oral)   Wt 192 lb 3.2 oz (87.2 kg)   SpO2 98%   BMI 27.58 kg/m  {Show previous vital signs (optional):23777}  Physical Exam Vitals reviewed.  Constitutional:      General: He is awake.     Appearance: Normal appearance. He is well-developed and well-groomed.  HENT:     Head: Normocephalic and atraumatic.  Pulmonary:     Effort: Pulmonary effort is normal.  Musculoskeletal:     Thoracic back: Tenderness and bony tenderness present. No lacerations or spasms. Normal range of motion. Scoliosis present.     Lumbar back: Tenderness and bony tenderness present. No swelling, deformity or spasms. Normal range of motion. Negative right straight leg raise test and negative left straight leg raise test. Scoliosis present.     Comments: ROM intact with thoracic spine- lateral rotation and lateral flexion intact and symmetrical Lumbar: extension and flexion intact  Hips: flexion, extension, abduction and adduction intact  and symmetrical  Hips flexion = 5/5 strength Plantar flexion and dorsiflexion = 5/5 strength   Neurological:     Mental Status: He is alert.  Psychiatric:        Behavior: Behavior is cooperative.      No results found for any visits on 02/25/23.  Assessment & Plan      No follow-ups on file.       Problem List Items Addressed This Visit   None Visit Diagnoses     Acute low back pain, unspecified back pain laterality, unspecified whether sciatica present    -  Primary   Relevant Medications   cyclobenzaprine (FLEXERIL) 10 MG tablet   diclofenac (VOLTAREN) 75 MG EC tablet   Acute midline thoracic back pain        Relevant Medications   cyclobenzaprine (FLEXERIL) 10 MG tablet   diclofenac (VOLTAREN) 75 MG EC tablet        No follow-ups on file.   I, Anothony Bursch E Shemuel Harkleroad, PA-C, have reviewed all documentation for this visit. The documentation on 02/25/23 for the exam, diagnosis, procedures, and orders are all accurate and complete.   Jacquelin Hawking, MHS, PA-C [facility]

## 2023-02-25 NOTE — Patient Instructions (Addendum)
  I recommend the following at this time to help relieve that discomfort:  Rest Warm compresses to the area (20 minutes on, minimum of 30 minutes off) You can alternate Tylenol and Ibuprofen for pain management but Ibuprofen is typically preferred to reduce inflammation.  I have sent in a script for Diclofenac to replace the Meloxicam. Please stay well hydrated and do not take with other NSAIDs I have refilled your Flexeril- this can make you feel drowsy so do not use if you need to remain alert  You can try Lidocaine patches - apply to the area once per 24 hours  Gentle stretches and exercises that I have included in your paperwork You can also try gentle massage and Epsom soaks in a warm bath  Try to reduce excess strain to the area and rest as much as possible  Wear supportive shoes and, if you must lift anything, use proper lifting techniques that spare your back.   If these measures do not lead to improvement in your symptoms over the next 2-4 weeks please let us know

## 2023-03-13 ENCOUNTER — Other Ambulatory Visit: Payer: Self-pay | Admitting: Physician Assistant

## 2023-03-13 ENCOUNTER — Other Ambulatory Visit: Payer: Self-pay | Admitting: Family Medicine

## 2023-03-13 DIAGNOSIS — M545 Low back pain, unspecified: Secondary | ICD-10-CM

## 2023-03-13 DIAGNOSIS — M546 Pain in thoracic spine: Secondary | ICD-10-CM

## 2023-03-15 NOTE — Telephone Encounter (Signed)
Requested Prescriptions  Pending Prescriptions Disp Refills   diclofenac (VOLTAREN) 75 MG EC tablet [Pharmacy Med Name: Diclofenac Sodium 75 MG Oral Tablet Delayed Release] 180 tablet 0    Sig: Take 1 tablet by mouth twice daily     Analgesics:  NSAIDS Failed - 03/13/2023 12:00 PM      Failed - Manual Review: Labs are only required if the patient has taken medication for more than 8 weeks.      Failed - HGB in normal range and within 360 days    Hemoglobin  Date Value Ref Range Status  12/24/2021 14.4 13.0 - 17.7 g/dL Final         Failed - PLT in normal range and within 360 days    Platelets  Date Value Ref Range Status  12/24/2021 212 150 - 450 x10E3/uL Final         Failed - HCT in normal range and within 360 days    Hematocrit  Date Value Ref Range Status  12/24/2021 40.8 37.5 - 51.0 % Final         Passed - Cr in normal range and within 360 days    Creatinine, Ser  Date Value Ref Range Status  06/28/2022 1.21 0.76 - 1.27 mg/dL Final         Passed - eGFR is 30 or above and within 360 days    GFR calc Af Amer  Date Value Ref Range Status  03/04/2020 91 >59 mL/min/1.73 Final    Comment:    **[facility] currently reports eGFR in compliance with the current**   recommendations of the [employer]. [facility] will   update reporting as new guidelines are published from the NKF-ASN   Task force.    GFR, Estimated  Date Value Ref Range Status  07/31/2021 >60 >60 mL/min Final    Comment:    (NOTE) Calculated using the CKD-EPI Creatinine Equation (2021)    eGFR  Date Value Ref Range Status  06/28/2022 69 >59 mL/min/1.73 Final         Passed - Patient is not pregnant      Passed - Valid encounter within last 12 months    Recent Outpatient Visits           2 weeks ago Acute low back pain, unspecified back pain laterality, unspecified whether sciatica present   [place] [facility] Mecum, Oswaldo Conroy, PA-C   5 months ago Diverticulitis of  descending colon   [place] [facility] Mecum, Oswaldo Conroy, PA-C   8 months ago Nasal congestion   [place] [facility] [place], Megan P, DO   1 year ago Routine general medical examination at a health care facility   [facility] [place], [place] P, DO   1 year ago Rib pain on right side    [facility] Gabriel Cirri, NP

## 2023-03-15 NOTE — Telephone Encounter (Signed)
Requested Prescriptions  Pending Prescriptions Disp Refills   traZODone (DESYREL) 50 MG tablet [Pharmacy Med Name: traZODone HCl 50 MG Oral Tablet] 90 tablet 0    Sig: TAKE 1/2 TO 1 (ONE-HALF TO ONE) TABLET BY MOUTH AT BEDTIME AS NEEDED FOR SLEEP     Psychiatry: Antidepressants - Serotonin Modulator Passed - 03/13/2023 12:00 PM      Passed - Valid encounter within last 6 months    Recent Outpatient Visits           2 weeks ago Acute low back pain, unspecified back pain laterality, unspecified whether sciatica present   [place] [facility] Mecum, Oswaldo Conroy, PA-C   5 months ago Diverticulitis of descending colon   [place] [facility] Mecum, Oswaldo Conroy, PA-C   8 months ago Nasal congestion   [place] [facility] [place], Megan P, DO   1 year ago Routine general medical examination at a health care facility   [place] [facility] [place], [place] P, DO   1 year ago Rib pain on right side   [place] [facility] Gabriel Cirri, NP

## 2023-03-16 ENCOUNTER — Other Ambulatory Visit: Payer: Self-pay | Admitting: Physician Assistant

## 2023-03-16 DIAGNOSIS — M546 Pain in thoracic spine: Secondary | ICD-10-CM

## 2023-03-16 DIAGNOSIS — M545 Low back pain, unspecified: Secondary | ICD-10-CM

## 2023-04-18 ENCOUNTER — Other Ambulatory Visit: Payer: Self-pay | Admitting: Family Medicine

## 2023-04-19 NOTE — Telephone Encounter (Signed)
Unable to refill per protocol, Rx expired. Discontinued 02/25/23.  Requested Prescriptions  Pending Prescriptions Disp Refills   meloxicam (MOBIC) 15 MG tablet [Pharmacy Med Name: Meloxicam 15 MG Oral Tablet] 90 tablet 0    Sig: TAKE 1 TABLET BY MOUTH ONCE DAILY AS NEEDED FOR PAIN     Analgesics:  COX2 Inhibitors Failed - 04/18/2023 10:08 AM      Failed - Manual Review: Labs are only required if the patient has taken medication for more than 8 weeks.      Failed - HGB in normal range and within 360 days    Hemoglobin  Date Value Ref Range Status  12/24/2021 14.4 13.0 - 17.7 g/dL Final         Failed - HCT in normal range and within 360 days    Hematocrit  Date Value Ref Range Status  12/24/2021 40.8 37.5 - 51.0 % Final         Passed - Cr in normal range and within 360 days    Creatinine, Ser  Date Value Ref Range Status  06/28/2022 1.21 0.76 - 1.27 mg/dL Final         Passed - AST in normal range and within 360 days    AST  Date Value Ref Range Status  06/28/2022 22 0 - 40 IU/L Final         Passed - ALT in normal range and within 360 days    ALT  Date Value Ref Range Status  06/28/2022 18 0 - 44 IU/L Final         Passed - eGFR is 30 or above and within 360 days    GFR calc Af Amer  Date Value Ref Range Status  03/04/2020 91 >59 mL/min/1.73 Final    Comment:    **[facility] currently reports eGFR in compliance with the current**   recommendations of the [employer]. [facility] will   update reporting as new guidelines are published from the NKF-ASN   Task force.    GFR, Estimated  Date Value Ref Range Status  07/31/2021 >60 >60 mL/min Final    Comment:    (NOTE) Calculated using the CKD-EPI Creatinine Equation (2021)    eGFR  Date Value Ref Range Status  06/28/2022 69 >59 mL/min/1.73 Final         Passed - Patient is not pregnant      Passed - Valid encounter within last 12 months    Recent Outpatient Visits           1 month ago Acute  low back pain, unspecified back pain laterality, unspecified whether sciatica present   [place] [facility] Mecum, Oswaldo Conroy, PA-C   7 months ago Diverticulitis of descending colon   [place] [facility] Mecum, Oswaldo Conroy, PA-C   9 months ago Nasal congestion   [place] [facility] [place], Megan P, DO   1 year ago Routine general medical examination at a health care facility   [facility] [place], [place] P, DO   1 year ago Rib pain on right side   [place] [facility] Gabriel Cirri, NP

## 2023-04-24 ENCOUNTER — Encounter: Payer: Self-pay | Admitting: Physician Assistant

## 2023-04-24 ENCOUNTER — Other Ambulatory Visit: Payer: Self-pay | Admitting: Family Medicine

## 2023-04-26 NOTE — Telephone Encounter (Signed)
Unable to refill per protocol, Rx expired. Discontinued 02/25/23.  Requested Prescriptions  Pending Prescriptions Disp Refills   meloxicam (MOBIC) 15 MG tablet [Pharmacy Med Name: Meloxicam 15 MG Oral Tablet] 90 tablet 0    Sig: TAKE 1 TABLET BY MOUTH ONCE DAILY AS NEEDED FOR PAIN     Analgesics:  COX2 Inhibitors Failed - 04/24/2023  2:31 PM      Failed - Manual Review: Labs are only required if the patient has taken medication for more than 8 weeks.      Failed - HGB in normal range and within 360 days    Hemoglobin  Date Value Ref Range Status  12/24/2021 14.4 13.0 - 17.7 g/dL Final         Failed - HCT in normal range and within 360 days    Hematocrit  Date Value Ref Range Status  12/24/2021 40.8 37.5 - 51.0 % Final         Passed - Cr in normal range and within 360 days    Creatinine, Ser  Date Value Ref Range Status  06/28/2022 1.21 0.76 - 1.27 mg/dL Final         Passed - AST in normal range and within 360 days    AST  Date Value Ref Range Status  06/28/2022 22 0 - 40 IU/L Final         Passed - ALT in normal range and within 360 days    ALT  Date Value Ref Range Status  06/28/2022 18 0 - 44 IU/L Final         Passed - eGFR is 30 or above and within 360 days    GFR calc Af Amer  Date Value Ref Range Status  03/04/2020 91 >59 mL/min/1.73 Final    Comment:    **[facility] currently reports eGFR in compliance with the current**   recommendations of the [employer]. [facility] will   update reporting as new guidelines are published from the NKF-ASN   Task force.    GFR, Estimated  Date Value Ref Range Status  07/31/2021 >60 >60 mL/min Final    Comment:    (NOTE) Calculated using the CKD-EPI Creatinine Equation (2021)    eGFR  Date Value Ref Range Status  06/28/2022 69 >59 mL/min/1.73 Final         Passed - Patient is not pregnant      Passed - Valid encounter within last 12 months    Recent Outpatient Visits           2 months ago Acute  low back pain, unspecified back pain laterality, unspecified whether sciatica present   [place] [facility] Mecum, Oswaldo Conroy, PA-C   7 months ago Diverticulitis of descending colon   [place] [facility] Mecum, Oswaldo Conroy, PA-C   10 months ago Nasal congestion   [place] [facility] [place], Megan P, DO   1 year ago Routine general medical examination at a health care facility   [facility] [place], [place] P, DO   1 year ago Rib pain on right side   [place] [facility] Gabriel Cirri, NP

## 2023-04-28 NOTE — Telephone Encounter (Signed)
He was last seen for this 2 months ago. If his pain is not improving or getting worse, he will need an apt to discuss other management options

## 2023-04-29 NOTE — Telephone Encounter (Signed)
Called and scheduled appointment on 05/05/2023 @ 4:00 pm.

## 2023-05-05 ENCOUNTER — Ambulatory Visit: Payer: 59 | Admitting: Family Medicine

## 2023-05-05 VITALS — BP 135/80 | HR 52 | Temp 97.9°F | Wt 193.2 lb

## 2023-05-05 DIAGNOSIS — E782 Mixed hyperlipidemia: Secondary | ICD-10-CM

## 2023-05-05 DIAGNOSIS — M4724 Other spondylosis with radiculopathy, thoracic region: Secondary | ICD-10-CM

## 2023-05-05 DIAGNOSIS — I700 Atherosclerosis of aorta: Secondary | ICD-10-CM | POA: Diagnosis not present

## 2023-05-05 DIAGNOSIS — Z0000 Encounter for general adult medical examination without abnormal findings: Secondary | ICD-10-CM | POA: Diagnosis not present

## 2023-05-05 DIAGNOSIS — I10 Essential (primary) hypertension: Secondary | ICD-10-CM

## 2023-05-05 MED ORDER — PREGABALIN 75 MG PO CAPS: ORAL_CAPSULE | ORAL | 2 refills | Status: DC

## 2023-05-05 MED ORDER — MELOXICAM 15 MG PO TABS: 15.0000 mg | ORAL_TABLET | Freq: Every day | ORAL | 1 refills | Status: DC

## 2023-05-05 NOTE — Progress Notes (Signed)
BP 135/80   Pulse (!) 52   Temp 97.9 F (36.6 C) (Oral)   Wt 193 lb 3.2 oz (87.6 kg)   SpO2 99%   BMI 27.72 kg/m    Subjective:    Patient ID: DOMINGOS TREVIZO, male    DOB: April 01, 1961, 62 y.o.   MRN: [id number]  HPI: ZYAD GLESSNER is a 62 y.o. male presenting on 05/05/2023 for comprehensive medical examination. Current medical complaints include:  BACK PAIN Duration: few months, worse significantly in the past couple of weeks Mechanism of injury: no trauma Location: midline and upper back Onset: gradual Severity: moderate Quality: sharp, grabbing, aching, burning in the lower back Frequency: constant Radiation: into his legs Aggravating factors: bending and moving Alleviating factors: doing nothing Status: worse Treatments attempted:  flexeril, rest, ice, heat, APAP, ibuprofen, aleve, and HEP  Relief with NSAIDs?: mild Nighttime pain:  no Paresthesias / decreased sensation:  yes- hands Bowel / bladder incontinence:  no Fevers:  no Dysuria / urinary frequency:  no  HYPERTENSION / HYPERLIPIDEMIA Satisfied with current treatment? yes Duration of hypertension: chronic BP monitoring frequency: not checking BP medication side effects: no Past BP meds: amlodipine, carvedilol Duration of hyperlipidemia: chronic Cholesterol medication side effects: no Cholesterol supplements: none Past cholesterol medications: crestor, zetia Medication compliance: excellent compliance Aspirin: yes Recent stressors: no Recurrent headaches: no Visual changes: no Palpitations: no Dyspnea: no Chest pain: no Lower extremity edema: no Dizzy/lightheaded: no  He currently lives with: wife Interim Problems from his last visit: no  Depression Screen done today and results listed below:     05/05/2023    4:08 PM 02/25/2023    3:46 PM 09/17/2022    3:08 PM 06/28/2022    3:53 PM 12/24/2021    3:47 PM  Depression screen PHQ 2/9  Decreased Interest 0 0 0 0 0  Down, Depressed, Hopeless 0 0 0 0 0   PHQ - 2 Score 0 0 0 0 0  Altered sleeping 1 0 0 1 1  Tired, decreased energy 1 0 2 1 1   Change in appetite 0 0 0 0 0  Feeling bad or failure about yourself  0 0 0 0 0  Trouble concentrating 0 0 0 0 0  Moving slowly or fidgety/restless 0 0 0 0 0  Suicidal thoughts 0 0 0 0 0  PHQ-9 Score 2 0 2 2 2   Difficult doing work/chores Not difficult at all Not difficult at all Not difficult at all      Past Medical History:  Past Medical History:  Diagnosis Date   Coronary artery disease    a. LHC 10/2014: mid LAD 99% stenosis s/p PCI/DES, 40% stenosis in the proximal LAD, D1 40% stenosis at the ostium of the vessel, EF > 55%   GERD (gastroesophageal reflux disease)    Hyperlipidemia    Hypertension     Surgical History:  Past Surgical History:  Procedure Laterality Date   CARDIAC CATHETERIZATION  11/08/2014   COLONOSCOPY WITH PROPOFOL N/A 10/09/2021   Procedure: COLONOSCOPY WITH PROPOFOL;  Surgeon: Wyline Mood, MD;  Location[facility] ENDOSCOPY;  Service: Gastroenterology;  Laterality: N/A;   CORONARY ANGIOPLASTY WITH STENT PLACEMENT  11/08/2014   drug eluting stent placement to the mid LAD   VASECTOMY  2000    Medications:  Current Outpatient Medications on File Prior to Visit  Medication Sig   amLODipine (NORVASC) 10 MG tablet Take 1 tablet (10 mg total) by mouth daily.   aspirin 81 MG  tablet Take 81 mg by mouth daily.   carvedilol (COREG) 25 MG tablet Take 1 tablet (25 mg total) by mouth 2 (two) times daily.   Coenzyme Q10 (COQ-10) 200 MG CAPS Take 200 mg by mouth daily.   cyanocobalamin 1000 MCG tablet Take 1,000 mcg by mouth daily.   cyclobenzaprine (FLEXERIL) 10 MG tablet Take 1 tablet (10 mg total) by mouth 3 (three) times daily as needed. for muscle spams   ezetimibe (ZETIA) 10 MG tablet Take 1 tablet (10 mg total) by mouth daily.   fluticasone (FLONASE) 50 MCG/ACT nasal spray Place 2 sprays into both nostrils daily.   rosuvastatin (CRESTOR) 5 MG tablet Take 1 tablet (5 mg total)  by mouth daily.   traZODone (DESYREL) 50 MG tablet TAKE 1/2 TO 1 (ONE-HALF TO ONE) TABLET BY MOUTH AT BEDTIME AS NEEDED FOR SLEEP   No current facility-administered medications on file prior to visit.    Allergies:  No Known Allergies  Social History:  Social History   Socioeconomic History   Marital status: Married    Spouse name: Not on file   Number of children: Not on file   Years of education: Not on file   Highest education level: 12th grade  Occupational History   Not on file  Tobacco Use   Smoking status: Former    Current packs/day: 0.00    Types: Cigarettes    Quit date: 09/13/1988    Years since quitting: 34.6   Smokeless tobacco: Never  Vaping Use   Vaping status: Never Used  Substance and Sexual Activity   Alcohol use: Yes    Alcohol/week: 0.0 standard drinks of alcohol    Comment: Rarely   Drug use: No   Sexual activity: Yes  Other Topics Concern   Not on file  Social History Narrative   Not on file   Social Determinants of Health   Financial Resource Strain: Low Risk  (02/25/2023)   Overall Financial Resource Strain (CARDIA)    Difficulty of Paying Living Expenses: Not hard at all  Food Insecurity: No Food Insecurity (02/25/2023)   Hunger Vital Sign    Worried About Running Out of Food in the Last Year: Never true    Ran Out of Food in the Last Year: Never true  Transportation Needs: No Transportation Needs (02/25/2023)   PRAPARE - Administrator, Civil Service (Medical): No    Lack of Transportation (Non-Medical): No  Physical Activity: Unknown (02/25/2023)   Exercise Vital Sign    Days of Exercise per Week: Patient declined    Minutes of Exercise per Session: Not on file  Stress: Stress Concern Present (02/25/2023)   [employer] of Occupational Health - Occupational Stress Questionnaire    Feeling of Stress : To some extent  Social Connections: Moderately Isolated (02/25/2023)   Social Connection and Isolation Panel [NHANES]     Frequency of Communication with Friends and Family: More than three times a week    Frequency of Social Gatherings with Friends and Family: Twice a week    Attends Religious Services: Never    Database administrator or Organizations: No    Attends Engineer, structural: Not on file    Marital Status: Married  Catering manager Violence: Not on file   Social History   Tobacco Use  Smoking Status Former   Current packs/day: 0.00   Types: Cigarettes   Quit date: 09/13/1988   Years since quitting: 34.6  Smokeless Tobacco Never  Social History   Substance and Sexual Activity  Alcohol Use Yes   Alcohol/week: 0.0 standard drinks of alcohol   Comment: Rarely    Family History:  Family History  Problem Relation Age of Onset   Hypertension Mother    Hypertension Father    Arrhythmia Father        Pacemaker implant   Hypertension Brother    Arthritis Brother    Stroke Maternal Grandmother    Lung cancer Maternal Grandfather     Past medical history, surgical history, medications, allergies, family history and social history reviewed with patient today and changes made to appropriate areas of the chart.   Review of Systems  Constitutional: Negative.   HENT: Negative.    Eyes: Negative.   Respiratory: Negative.    Cardiovascular:  Positive for leg swelling. Negative for chest pain, palpitations, orthopnea, claudication and PND.  Gastrointestinal: Negative.   Genitourinary: Negative.        + weak stream  Musculoskeletal:  Positive for back pain and myalgias. Negative for falls, joint pain and neck pain.  Skin: Negative.   Neurological:  Positive for tingling. Negative for dizziness, tremors, sensory change, speech change, focal weakness, seizures, loss of consciousness, weakness and headaches.  Endo/Heme/Allergies: Negative.   Psychiatric/Behavioral: Negative.     All other ROS negative except what is listed above and in the HPI.      Objective:    BP 135/80    Pulse (!) 52   Temp 97.9 F (36.6 C) (Oral)   Wt 193 lb 3.2 oz (87.6 kg)   SpO2 99%   BMI 27.72 kg/m   Wt Readings from Last 3 Encounters:  05/05/23 193 lb 3.2 oz (87.6 kg)  02/25/23 192 lb 3.2 oz (87.2 kg)  09/17/22 190 lb 11.2 oz (86.5 kg)    Physical Exam Vitals and nursing note reviewed.  Constitutional:      General: He is not in acute distress.    Appearance: Normal appearance. He is not ill-appearing, toxic-appearing or diaphoretic.  HENT:     Head: Normocephalic and atraumatic.     Right Ear: Tympanic membrane, ear canal and external ear normal. There is no impacted cerumen.     Left Ear: Tympanic membrane, ear canal and external ear normal. There is no impacted cerumen.     Nose: Nose normal. No congestion or rhinorrhea.     Mouth/Throat:     Mouth: Mucous membranes are moist.     Pharynx: Oropharynx is clear. No oropharyngeal exudate or posterior oropharyngeal erythema.  Eyes:     General: No scleral icterus.       Right eye: No discharge.        Left eye: No discharge.     Extraocular Movements: Extraocular movements intact.     Conjunctiva/sclera: Conjunctivae normal.     Pupils: Pupils are equal, round, and reactive to light.  Neck:     Vascular: No carotid bruit.  Cardiovascular:     Rate and Rhythm: Normal rate and regular rhythm.     Pulses: Normal pulses.     Heart sounds: No murmur heard.    No friction rub. No gallop.  Pulmonary:     Effort: Pulmonary effort is normal. No respiratory distress.     Breath sounds: Normal breath sounds. No stridor. No wheezing, rhonchi or rales.  Chest:     Chest wall: No tenderness.  Abdominal:     General: Abdomen is flat. Bowel sounds are normal. There is  no distension.     Palpations: Abdomen is soft. There is no mass.     Tenderness: There is no abdominal tenderness. There is no right CVA tenderness, left CVA tenderness, guarding or rebound.     Hernia: No hernia is present.  Genitourinary:    Comments: Genital  exam deferred with shared decision making Musculoskeletal:        General: No swelling, tenderness, deformity or signs of injury.     Cervical back: Normal range of motion and neck supple. No rigidity. No muscular tenderness.     Right lower leg: No edema.     Left lower leg: No edema.  Lymphadenopathy:     Cervical: No cervical adenopathy.  Skin:    General: Skin is warm and dry.     Capillary Refill: Capillary refill takes less than 2 seconds.     Coloration: Skin is not jaundiced or pale.     Findings: No bruising, erythema, lesion or rash.  Neurological:     General: No focal deficit present.     Mental Status: He is alert and oriented to person, place, and time.     Cranial Nerves: No cranial nerve deficit.     Sensory: No sensory deficit.     Motor: No weakness.     Coordination: Coordination normal.     Gait: Gait normal.     Deep Tendon Reflexes: Reflexes normal.  Psychiatric:        Mood and Affect: Mood normal.        Behavior: Behavior normal.        Thought Content: Thought content normal.        Judgment: Judgment normal.     Results for orders placed or performed in visit on 05/05/23  Comprehensive metabolic panel  Result Value Ref Range   Glucose 88 70 - 99 mg/dL   BUN 16 8 - 27 mg/dL   Creatinine, Ser 1.61 0.76 - 1.27 mg/dL   eGFR 72 >09 UE/AVW/0.98   BUN/Creatinine Ratio 14 10 - 24   Sodium 143 134 - 144 mmol/L   Potassium 4.4 3.5 - 5.2 mmol/L   Chloride 104 96 - 106 mmol/L   CO2 25 20 - 29 mmol/L   Calcium 9.6 8.6 - 10.2 mg/dL   Total Protein 6.8 6.0 - 8.5 g/dL   Albumin 4.5 3.9 - 4.9 g/dL   Globulin, Total 2.3 1.5 - 4.5 g/dL   Bilirubin Total 1.3 (H) 0.0 - 1.2 mg/dL   Alkaline Phosphatase 79 44 - 121 IU/L   AST 22 0 - 40 IU/L   ALT 22 0 - 44 IU/L  CBC with Differential/Platelet  Result Value Ref Range   WBC 6.2 3.4 - 10.8 x10E3/uL   RBC 4.47 4.14 - 5.80 x10E6/uL   Hemoglobin 14.7 13.0 - 17.7 g/dL   Hematocrit 11.9 14.7 - 51.0 %   MCV 93 79 -  97 fL   MCH 32.9 26.6 - 33.0 pg   MCHC 35.3 31.5 - 35.7 g/dL   RDW 82.9 56.2 - 13.0 %   Platelets 178 150 - 450 x10E3/uL   Neutrophils 57 Not Estab. %   Lymphs 29 Not Estab. %   Monocytes 9 Not Estab. %   Eos 4 Not Estab. %   Basos 1 Not Estab. %   Neutrophils Absolute 3.6 1.4 - 7.0 x10E3/uL   Lymphocytes Absolute 1.8 0.7 - 3.1 x10E3/uL   Monocytes Absolute 0.6 0.1 - 0.9 x10E3/uL   EOS (ABSOLUTE)  0.2 0.0 - 0.4 x10E3/uL   Basophils Absolute 0.0 0.0 - 0.2 x10E3/uL   Immature Granulocytes 0 Not Estab. %   Immature Grans (Abs) 0.0 0.0 - 0.1 x10E3/uL  Lipid Panel w/o Chol/HDL Ratio  Result Value Ref Range   Cholesterol, Total 101 100 - 199 mg/dL   Triglycerides 811 0 - 149 mg/dL   HDL 43 >91 mg/dL   VLDL Cholesterol Cal 20 5 - 40 mg/dL   LDL Chol Calc (NIH) 38 0 - 99 mg/dL  PSA  Result Value Ref Range   Prostate Specific Ag, Serum 1.2 0.0 - 4.0 ng/mL  TSH  Result Value Ref Range   TSH 1.790 0.450 - 4.500 uIU/mL  HIV Antibody (routine testing w rflx)  Result Value Ref Range   HIV Screen 4th Generation wRfx Non Reactive Non Reactive  Hepatitis C Antibody  Result Value Ref Range   Hep C Virus Ab Non Reactive Non Reactive      Assessment & Plan:   Problem List Items Addressed This Visit       Cardiovascular and Mediastinum   Benign hypertension    Under good control on current regimen. Continue current regimen. Continue to monitor. Call with any concerns. Refills through cardiology. Labs drawn today.        Aortic atherosclerosis (HCC)    Will keep BP and cholesterol under good control. Continue to monitor. Call with any concerns.         Nervous and Auditory   Osteoarthritis of spine with radiculopathy, thoracic region    Discussed PT and MRI. We will change his naproxen to meloxicam and start lyrica. Referral to PM&R placed today. Continue to monitor. Call with any concerns.       Relevant Medications   meloxicam (MOBIC) 15 MG tablet   pregabalin (LYRICA) 75 MG  capsule   Other Relevant Orders   Ambulatory referral to Physical Medicine Rehab     Other   Hyperlipidemia    Under good control on current regimen. Continue current regimen. Continue to monitor. Call with any concerns. Refills through cardiology. Labs drawn today.       Other Visit Diagnoses     Routine general medical examination at a health care facility    -  Primary   Vaccines up to date/declined. Screening labs checked today. Colonoscopy up to date. Continue diet and exercise. Call with any concerns.   Relevant Orders   Comprehensive metabolic panel (Completed)   CBC with Differential/Platelet (Completed)   Lipid Panel w/o Chol/HDL Ratio (Completed)   PSA (Completed)   TSH (Completed)   HIV Antibody (routine testing w rflx) (Completed)   Hepatitis C Antibody (Completed)        Discussed aspirin prophylaxis for myocardial infarction prevention and decision was made to continue ASA  LABORATORY TESTING:  Health maintenance labs ordered today as discussed above.   The natural history of prostate cancer and ongoing controversy regarding screening and potential treatment outcomes of prostate cancer has been discussed with the patient. The meaning of a false positive PSA and a false negative PSA has been discussed. He indicates understanding of the limitations of this screening test and wishes to proceed with screening PSA testing.   IMMUNIZATIONS:   - Tdap: Tetanus vaccination status reviewed: Declined. - Influenza: Refused - Pneumovax: Not applicable - Prevnar: Not applicable - COVID: Refused - HPV: Not applicable - Shingrix vaccine: Up to date  SCREENING: - Colonoscopy: Up to date  Discussed with patient purpose of  the colonoscopy is to detect colon cancer at curable precancerous or early stages   PATIENT COUNSELING:    Sexuality: Discussed sexually transmitted diseases, partner selection, use of condoms, avoidance of unintended pregnancy  and contraceptive  alternatives.   Advised to avoid cigarette smoking.  I discussed with the patient that most people either abstain from alcohol or drink within safe limits (<=14/week and <=4 drinks/occasion for males, <=7/weeks and <= 3 drinks/occasion for females) and that the risk for alcohol disorders and other health effects rises proportionally with the number of drinks per week and how often a drinker exceeds daily limits.  Discussed cessation/primary prevention of drug use and availability of treatment for abuse.   Diet: Encouraged to adjust caloric intake to maintain  or achieve ideal body weight, to reduce intake of dietary saturated fat and total fat, to limit sodium intake by avoiding high sodium foods and not adding table salt, and to maintain adequate dietary potassium and calcium preferably from fresh fruits, vegetables, and low-fat dairy products.    stressed the importance of regular exercise  Injury prevention: Discussed safety belts, safety helmets, smoke detector, smoking near bedding or upholstery.   Dental health: Discussed importance of regular tooth brushing, flossing, and dental visits.   Follow up plan: NEXT PREVENTATIVE PHYSICAL DUE IN 1 YEAR. Return in about 6 weeks (around 06/16/2023) for virtual OK.

## 2023-05-06 LAB — PSA: Prostate Specific Ag, Serum: 1.2 ng/mL (ref 0.0–4.0)

## 2023-05-06 LAB — CBC WITH DIFFERENTIAL/PLATELET
Basophils Absolute: 0 10*3/uL (ref 0.0–0.2)
Basos: 1 %
EOS (ABSOLUTE): 0.2 10*3/uL (ref 0.0–0.4)
Eos: 4 %
Hematocrit: 41.6 % (ref 37.5–51.0)
Hemoglobin: 14.7 g/dL (ref 13.0–17.7)
Immature Grans (Abs): 0 10*3/uL (ref 0.0–0.1)
Immature Granulocytes: 0 %
Lymphocytes Absolute: 1.8 10*3/uL (ref 0.7–3.1)
Lymphs: 29 %
MCH: 32.9 pg (ref 26.6–33.0)
MCHC: 35.3 g/dL (ref 31.5–35.7)
MCV: 93 fL (ref 79–97)
Monocytes Absolute: 0.6 10*3/uL (ref 0.1–0.9)
Monocytes: 9 %
Neutrophils Absolute: 3.6 10*3/uL (ref 1.4–7.0)
Neutrophils: 57 %
Platelets: 178 10*3/uL (ref 150–450)
RBC: 4.47 x10E6/uL (ref 4.14–5.80)
RDW: 11.9 % (ref 11.6–15.4)
WBC: 6.2 10*3/uL (ref 3.4–10.8)

## 2023-05-06 LAB — COMPREHENSIVE METABOLIC PANEL
ALT: 22 IU/L (ref 0–44)
AST: 22 IU/L (ref 0–40)
Albumin: 4.5 g/dL (ref 3.9–4.9)
Alkaline Phosphatase: 79 IU/L (ref 44–121)
BUN/Creatinine Ratio: 14 (ref 10–24)
BUN: 16 mg/dL (ref 8–27)
Bilirubin Total: 1.3 mg/dL — ABNORMAL HIGH (ref 0.0–1.2)
CO2: 25 mmol/L (ref 20–29)
Calcium: 9.6 mg/dL (ref 8.6–10.2)
Chloride: 104 mmol/L (ref 96–106)
Creatinine, Ser: 1.15 mg/dL (ref 0.76–1.27)
Globulin, Total: 2.3 g/dL (ref 1.5–4.5)
Glucose: 88 mg/dL (ref 70–99)
Potassium: 4.4 mmol/L (ref 3.5–5.2)
Sodium: 143 mmol/L (ref 134–144)
Total Protein: 6.8 g/dL (ref 6.0–8.5)
eGFR: 72 mL/min/{1.73_m2} (ref >59)

## 2023-05-06 LAB — HIV ANTIBODY (ROUTINE TESTING W REFLEX): HIV Screen 4th Generation wRfx: NONREACTIVE

## 2023-05-06 LAB — HEPATITIS C ANTIBODY: Hep C Virus Ab: NONREACTIVE

## 2023-05-06 LAB — TSH: TSH: 1.79 u[IU]/mL (ref 0.450–4.500)

## 2023-05-06 LAB — LIPID PANEL W/O CHOL/HDL RATIO
Cholesterol, Total: 101 mg/dL (ref 100–199)
HDL: 43 mg/dL (ref >39)
LDL Chol Calc (NIH): 38 mg/dL (ref 0–99)
Triglycerides: 105 mg/dL (ref 0–149)
VLDL Cholesterol Cal: 20 mg/dL (ref 5–40)

## 2023-05-08 ENCOUNTER — Encounter: Payer: Self-pay | Admitting: Family Medicine

## 2023-05-08 DIAGNOSIS — M4724 Other spondylosis with radiculopathy, thoracic region: Secondary | ICD-10-CM | POA: Insufficient documentation

## 2023-05-08 NOTE — Assessment & Plan Note (Signed)
Will keep BP and cholesterol under good control. Continue to monitor. Call with any concerns.  

## 2023-05-08 NOTE — Assessment & Plan Note (Signed)
Under good control on current regimen. Continue current regimen. Continue to monitor. Call with any concerns. Refills through cardiology. Labs drawn today. 

## 2023-05-08 NOTE — Assessment & Plan Note (Signed)
Discussed PT and MRI. We will change his naproxen to meloxicam and start lyrica. Referral to PM&R placed today. Continue to monitor. Call with any concerns.

## 2023-06-07 ENCOUNTER — Other Ambulatory Visit: Payer: Self-pay | Admitting: Family Medicine

## 2023-06-08 NOTE — Telephone Encounter (Signed)
Requested Prescriptions  Pending Prescriptions Disp Refills   traZODone (DESYREL) 50 MG tablet [Pharmacy Med Name: traZODone HCl 50 MG Oral Tablet] 90 tablet 0    Sig: TAKE 1/2 TO 1 (ONE-HALF TO ONE) TABLET BY MOUTH AT BEDTIME AS NEEDED FOR  SLEEP     Psychiatry: Antidepressants - Serotonin Modulator Passed - 06/07/2023  2:07 PM      Passed - Valid encounter within last 6 months    Recent Outpatient Visits           1 month ago Routine general medical examination at a health care facility   [facility], [place] P, DO   3 months ago Acute low back pain, unspecified back pain laterality, unspecified whether sciatica present   [place] [facility] Mecum, Oswaldo Conroy, PA-C   8 months ago Diverticulitis of descending colon   [place] [facility] Mecum, Oswaldo Conroy, PA-C   11 months ago Nasal congestion   [place] [facility] [place], Megan P, DO   1 year ago Routine general medical examination at a health care facility   [facility] Dorcas Carrow, DO       Future Appointments             In 2 weeks Laural Benes, Oralia Rud, DO [place] [facility]

## 2023-06-23 ENCOUNTER — Telehealth: Payer: 59 | Admitting: Family Medicine

## 2023-08-02 ENCOUNTER — Other Ambulatory Visit: Payer: Self-pay | Admitting: Family Medicine

## 2023-08-02 DIAGNOSIS — M5416 Radiculopathy, lumbar region: Secondary | ICD-10-CM

## 2023-08-03 ENCOUNTER — Other Ambulatory Visit: Payer: Self-pay | Admitting: Family Medicine

## 2023-08-03 DIAGNOSIS — S00259A Superficial foreign body of unspecified eyelid and periocular area, initial encounter: Secondary | ICD-10-CM

## 2023-08-23 ENCOUNTER — Other Ambulatory Visit: Payer: 59

## 2023-08-25 ENCOUNTER — Other Ambulatory Visit: Payer: Self-pay | Admitting: Physician Assistant

## 2023-08-25 ENCOUNTER — Other Ambulatory Visit: Payer: Self-pay | Admitting: Family Medicine

## 2023-08-25 DIAGNOSIS — M546 Pain in thoracic spine: Secondary | ICD-10-CM

## 2023-08-25 DIAGNOSIS — M545 Low back pain, unspecified: Secondary | ICD-10-CM

## 2023-08-25 NOTE — Telephone Encounter (Signed)
Requested medications are due for refill today.  yes  Requested medications are on the active medications list.  yes  Last refill. 02/25/2023 #30 0 rf  Future visit scheduled.   no  Notes to clinic.  Refill not delegated.    Requested Prescriptions  Pending Prescriptions Disp Refills   cyclobenzaprine (FLEXERIL) 10 MG tablet [Pharmacy Med Name: Cyclobenzaprine HCl 10 MG Oral Tablet] 30 tablet 0    Sig: Take 1 tablet by mouth three times daily as needed for muscle spasm     Not Delegated - Analgesics:  Muscle Relaxants Failed - 08/25/2023  5:34 PM      Failed - This refill cannot be delegated      Passed - Valid encounter within last 6 months    Recent Outpatient Visits           3 months ago Routine general medical examination at a health care facility   [facility], [place] P, DO   6 months ago Acute low back pain, unspecified back pain laterality, unspecified whether sciatica present   [facility] Mecum, Oswaldo Conroy, PA-C   11 months ago Diverticulitis of descending colon   [place] [facility] Mecum, Oswaldo Conroy, PA-C   1 year ago Nasal congestion   [place] [facility] [place], Megan P, DO   1 year ago Routine general medical examination at a health care facility   [facility], Megan P, DO

## 2023-08-25 NOTE — Telephone Encounter (Signed)
Requested Prescriptions  Pending Prescriptions Disp Refills   traZODone (DESYREL) 50 MG tablet [Pharmacy Med Name: traZODone HCl 50 MG Oral Tablet] 90 tablet 0    Sig: TAKE 1/2 TO 1 (ONE-HALF TO ONE) TABLET BY MOUTH AT BEDTIME AS NEEDED FOR SLEEP     Psychiatry: Antidepressants - Serotonin Modulator Passed - 08/25/2023  5:36 PM      Passed - Valid encounter within last 6 months    Recent Outpatient Visits           3 months ago Routine general medical examination at a health care facility   [facility], [place] P, DO   6 months ago Acute low back pain, unspecified back pain laterality, unspecified whether sciatica present   [place] [facility] Mecum, Oswaldo Conroy, PA-C   11 months ago Diverticulitis of descending colon   [place] [facility] Mecum, Oswaldo Conroy, PA-C   1 year ago Nasal congestion   [place] [facility] [place], Megan P, DO   1 year ago Routine general medical examination at a health care facility   [facility] [place], Megan P, DO

## 2023-09-14 ENCOUNTER — Other Ambulatory Visit: Payer: Self-pay | Admitting: Cardiovascular Disease

## 2023-09-15 NOTE — Telephone Encounter (Signed)
Pt scheduled on 2/25

## 2023-09-15 NOTE — Telephone Encounter (Signed)
Please contact pt for future appointment. Pt overdue for 12 month f/u. Pt needing refills. 

## 2023-09-22 ENCOUNTER — Other Ambulatory Visit: Payer: Self-pay | Admitting: Family Medicine

## 2023-09-22 ENCOUNTER — Encounter: Payer: Self-pay | Admitting: Cardiovascular Disease

## 2023-09-22 DIAGNOSIS — E782 Mixed hyperlipidemia: Secondary | ICD-10-CM

## 2023-09-22 DIAGNOSIS — I25118 Atherosclerotic heart disease of native coronary artery with other forms of angina pectoris: Secondary | ICD-10-CM

## 2023-09-22 MED ORDER — ROSUVASTATIN CALCIUM 5 MG PO TABS: 5.0000 mg | ORAL_TABLET | Freq: Every day | ORAL | 0 refills | Status: DC

## 2023-09-26 NOTE — Telephone Encounter (Signed)
 Please contact last prescriber Dr Perla pt's cardiologist who last prescribed this med on 08/31/22 #90/3 RF  Requested Prescriptions  Refused Prescriptions Disp Refills   amLODipine  (NORVASC ) 10 MG tablet [Pharmacy Med Name: amLODIPine  Besylate 10 MG Oral Tablet] 30 tablet 0    Sig: Take 1 tablet by mouth once daily     Cardiovascular: Calcium  Channel Blockers 2 Passed - 09/26/2023 11:05 AM      Passed - Last BP in normal range    BP Readings from Last 1 Encounters:  05/05/23 135/80         Passed - Last Heart Rate in normal range    Pulse Readings from Last 1 Encounters:  05/05/23 (!) 52         Passed - Valid encounter within last 6 months    Recent Outpatient Visits           4 months ago Routine general medical examination at a health care facility   [facility], Megan P, DO   7 months ago Acute low back pain, unspecified back pain laterality, unspecified whether sciatica present   [place] [facility] Mecum, Rocky BRAVO, PA-C   1 year ago Diverticulitis of descending colon   [place] [facility] Mecum, Rocky BRAVO, PA-C   1 year ago Nasal congestion   [place] [facility] [place], Megan P, DO   1 year ago Routine general medical examination at a health care facility   [facility], Du[place]aine SQUIBB, DO       Future Appointments             In 1 month Gollan, Timothy J, MD [facility]

## 2023-10-02 ENCOUNTER — Encounter: Payer: Self-pay | Admitting: Cardiovascular Disease

## 2023-10-02 DIAGNOSIS — I25118 Atherosclerotic heart disease of native coronary artery with other forms of angina pectoris: Secondary | ICD-10-CM

## 2023-10-03 MED ORDER — AMLODIPINE BESYLATE 10 MG PO TABS: 10.0000 mg | ORAL_TABLET | Freq: Every day | ORAL | 0 refills | Status: DC

## 2023-10-03 MED ORDER — CARVEDILOL 25 MG PO TABS: 25.0000 mg | ORAL_TABLET | Freq: Two times a day (BID) | ORAL | 0 refills | Status: DC

## 2023-10-24 ENCOUNTER — Other Ambulatory Visit: Payer: Self-pay | Admitting: Family Medicine

## 2023-10-25 NOTE — Telephone Encounter (Signed)
Requested medications are due for refill today.  yes  Requested medications are on the active medications list.  yes  Last refill. 05/05/2023  Future visit scheduled.   no  Notes to clinic.  New medication to pt.    Requested Prescriptions  Pending Prescriptions Disp Refills   meloxicam (MOBIC) 15 MG tablet [Pharmacy Med Name: Meloxicam 15 MG Oral Tablet] 90 tablet 0    Sig: Take 1 tablet by mouth once daily     Analgesics:  COX2 Inhibitors Failed - 10/25/2023 11:10 AM      Failed - Manual Review: Labs are only required if the patient has taken medication for more than 8 weeks.      Passed - HGB in normal range and within 360 days    Hemoglobin  Date Value Ref Range Status  05/05/2023 14.7 13.0 - 17.7 g/dL Final         Passed - Cr in normal range and within 360 days    Creatinine, Ser  Date Value Ref Range Status  05/05/2023 1.15 0.76 - 1.27 mg/dL Final         Passed - HCT in normal range and within 360 days    Hematocrit  Date Value Ref Range Status  05/05/2023 41.6 37.5 - 51.0 % Final         Passed - AST in normal range and within 360 days    AST  Date Value Ref Range Status  05/05/2023 22 0 - 40 IU/L Final         Passed - ALT in normal range and within 360 days    ALT  Date Value Ref Range Status  05/05/2023 22 0 - 44 IU/L Final         Passed - eGFR is 30 or above and within 360 days    GFR calc Af Amer  Date Value Ref Range Status  03/04/2020 91 >59 mL/min/1.73 Final    Comment:    **[facility] currently reports eGFR in compliance with the current**   recommendations of the [employer]. [facility] will   update reporting as new guidelines are published from the NKF-ASN   Task force.    GFR, Estimated  Date Value Ref Range Status  07/31/2021 >60 >60 mL/min Final    Comment:    (NOTE) Calculated using the CKD-EPI Creatinine Equation (2021)    eGFR  Date Value Ref Range Status  05/05/2023 72 >59 mL/min/1.73 Final         Passed -  Patient is not pregnant      Passed - Valid encounter within last 12 months    Recent Outpatient Visits           5 months ago Routine general medical examination at a health care facility   [facility], Megan P, DO   8 months ago Acute low back pain, unspecified back pain laterality, unspecified whether sciatica present   [facility] Mecum, Oswaldo Conroy, PA-C   1 year ago Diverticulitis of descending colon   [place] [facility] Mecum, Oswaldo Conroy, PA-C   1 year ago Nasal congestion   [place] Ovando, Megan P, DO   1 year ago Routine general medical examination at a health care facility   [facility] Dorcas Carrow, DO       Future Appointments  In 2 weeks Gollan, Tollie Pizza, MD [facility] [place]

## 2023-10-25 NOTE — Telephone Encounter (Signed)
Patient is overdue for an appointment. Please call to schedule and then route to provider for refill.

## 2023-10-30 ENCOUNTER — Encounter: Payer: Self-pay | Admitting: Family Medicine

## 2023-10-31 MED ORDER — MELOXICAM 15 MG PO TABS: 15.0000 mg | ORAL_TABLET | Freq: Every day | ORAL | 1 refills | Status: DC

## 2023-11-07 NOTE — Progress Notes (Unsigned)
 Patient ID: Jeff Fields, male   DOB: Sep 20, 1960, 63 y.o.   MRN: [id number] Cardiology Office Note  Date:  11/08/2023   ID:  Jeff Fields, DOB 06-02-1961, MRN [id number]  PCP:  Jeff Carrow, DO   Chief Complaint  Patient presents with   12 month follow up     "Doing well."     HPI:  Jeff Fields is a 63 year old gentleman with history of remote smoking history,  initially presenting with chest pain on exertion,    stress test that showed anterior wall ischemia, cardiac catheterization and stent placement to his mid LAD 11/08/14.  He presents today for follow-up of his coronary artery disease  Last seen by myself in clinic December 2023  In follow-up reports he is having trouble with his back Pain is constant, has tried injections Scheduled for back MRI and follow-up with neurosurgery in [place]  Denies significant chest pain or shortness of breath concerning for angina Activity has been limited secondary to back pain Has not been monitoring blood pressure at home, typically it is well-controlled  Cholesterol at goal Total chol 101, LDL 38  Stress test Myoview March 2022 low risk study, no significant ischemia  EKG personally reviewed by myself on todays visit EKG Interpretation Date/Time:  Tuesday November 08 2023 16:14:16 EST Ventricular Rate:  61 PR Interval:  174 QRS Duration:  88 QT Interval:  422 QTC Calculation: 424 R Axis:   -20  Text Interpretation: Normal sinus rhythm Normal ECG When compared with ECG of 08-Nov-2014 10:41, Criteria for Septal infarct are no longer Present Confirmed by Jeff Fields [phone]) on 11/08/2023 4:29:35 PM    Other past medical history reviewed  Father diagnosed with testicular cancer requiring treatment.   PMH:   has a past medical history of Coronary artery disease, GERD (gastroesophageal reflux disease), Hyperlipidemia, and Hypertension. Also history of coronary artery disease, prior stent  PSH:    Past Surgical History:   Procedure Laterality Date   CARDIAC CATHETERIZATION  11/08/2014   COLONOSCOPY WITH PROPOFOL N/A 10/09/2021   Procedure: COLONOSCOPY WITH PROPOFOL;  Surgeon: Jeff Mood, MD;  Location: [facility] ENDOSCOPY;  Service: Gastroenterology;  Laterality: N/A;   CORONARY ANGIOPLASTY WITH STENT PLACEMENT  11/08/2014   drug eluting stent placement to the mid LAD   VASECTOMY  2000    Current Outpatient Medications  Medication Sig Dispense Refill   amLODipine (NORVASC) 10 MG tablet Take 1 tablet (10 mg total) by mouth daily. 90 tablet 0   aspirin 81 MG tablet Take 81 mg by mouth daily.     carvedilol (COREG) 25 MG tablet Take 1 tablet (25 mg total) by mouth 2 (two) times daily. 180 tablet 0   Coenzyme Q10 (COQ-10) 200 MG CAPS Take 200 mg by mouth daily.     cyanocobalamin 1000 MCG tablet Take 1,000 mcg by mouth daily.     cyclobenzaprine (FLEXERIL) 10 MG tablet Take 1 tablet by mouth three times daily as needed for muscle spasm 30 tablet 0   ezetimibe (ZETIA) 10 MG tablet Take 1 tablet (10 mg total) by mouth daily. Pt needs to keep upcoming appt in Feb for further refills 90 tablet 0   fluticasone (FLONASE) 50 MCG/ACT nasal spray Place 2 sprays into both nostrils daily. 16 g 6   meloxicam (MOBIC) 15 MG tablet Take 1 tablet (15 mg total) by mouth daily. 90 tablet 1   rosuvastatin (CRESTOR) 5 MG tablet Take 1 tablet (5 mg total) by mouth daily.  90 tablet 0   traZODone (DESYREL) 50 MG tablet TAKE 1/2 TO 1 (ONE-HALF TO ONE) TABLET BY MOUTH AT BEDTIME AS NEEDED FOR SLEEP 90 tablet 0   pregabalin (LYRICA) 75 MG capsule Take 1 pill in the AM for 1 week, then increase to 1 pill 2x a day for a week, then take 2 pills in the AM and 1 pill in the PM for 1 week, then 2 pill 2x a day after that (Patient not taking: Reported on 11/08/2023) 120 capsule 2   No current facility-administered medications for this visit.    Allergies:   Patient has no known allergies.   Social History:  The patient  reports that he quit  smoking about 35 years ago. His smoking use included cigarettes. He has never used smokeless tobacco. He reports current alcohol use. He reports that he does not use drugs.   Family History:   family history includes Arrhythmia in his father; Arthritis in his brother; Hypertension in his brother, father, and mother; Lung cancer in his maternal grandfather; Stroke in his maternal grandmother.    Review of Systems: Review of Systems  Constitutional: Negative.   HENT: Negative.    Respiratory: Negative.    Cardiovascular: Negative.   Gastrointestinal: Negative.   Musculoskeletal: Negative.   Skin: Negative.   Neurological: Negative.   Psychiatric/Behavioral: Negative.    All other systems reviewed and are negative.   PHYSICAL EXAM: VS:  BP (!) 140/90 (BP Location: Left Arm, Patient Position: Sitting, Cuff Size: Normal)   Pulse 61   Ht 5\' 9"  (1.753 m)   Wt 194 lb 8 oz (88.2 kg)   SpO2 98%   BMI 28.72 kg/m  , BMI Body mass index is 28.72 kg/m. Constitutional:  oriented to person, place, and time. No distress.  HENT:  Head: Grossly normal Eyes:  no discharge. No scleral icterus.  Neck: No JVD, no carotid bruits  Cardiovascular: Regular rate and rhythm, no murmurs appreciated Pulmonary/Chest: Clear to auscultation bilaterally, no wheezes or rails Abdominal: Soft.  no distension.  no tenderness.  Musculoskeletal: Normal range of motion Neurological:  normal muscle tone. Coordination normal. No atrophy Skin: Skin warm and dry Psychiatric: normal affect, pleasant  Recent Labs: 05/05/2023: ALT 22; BUN 16; Creatinine, Ser 1.15; Hemoglobin 14.7; Platelets 178; Potassium 4.4; Sodium 143; TSH 1.790    Lipid Panel Lab Results  Component Value Date   CHOL 101 05/05/2023   HDL 43 05/05/2023   LDLCALC 38 05/05/2023   TRIG 105 05/05/2023      Wt Readings from Last 3 Encounters:  11/08/23 194 lb 8 oz (88.2 kg)  05/05/23 193 lb 3.2 oz (87.6 kg)  02/25/23 192 lb 3.2 oz (87.2 kg)      ASSESSMENT AND PLAN:  Coronary artery disease involving native coronary artery of native heart without angina pectoris stress test March 2022 Currently with no symptoms of angina. No further workup at this time. Continue current medication regimen.  Risk factors at goal  Hyperlipidemia on Crestor 5 daily, Zetia 10 mg daily  Cholesterol with excellent control  Essential hypertension Blood pressure running high end of range, recommend close monitoring at home  S/P coronary artery stent placement Denies anginal symptoms On aspirin, off Plavix   Orders Placed This Encounter  Procedures   EKG 12-Lead     Signed, Dossie Arbour, M.D., Ph.D. 11/08/2023  [facility] [place], [place] [phone]

## 2023-11-08 ENCOUNTER — Encounter: Payer: Self-pay | Admitting: Cardiovascular Disease

## 2023-11-08 ENCOUNTER — Ambulatory Visit: Payer: 59 | Attending: Cardiovascular Disease | Admitting: Cardiovascular Disease

## 2023-11-08 VITALS — BP 140/90 | HR 61 | Ht 69.0 in | Wt 194.5 lb

## 2023-11-08 DIAGNOSIS — I10 Essential (primary) hypertension: Secondary | ICD-10-CM

## 2023-11-08 DIAGNOSIS — I998 Other disorder of circulatory system: Secondary | ICD-10-CM

## 2023-11-08 DIAGNOSIS — E782 Mixed hyperlipidemia: Secondary | ICD-10-CM

## 2023-11-08 DIAGNOSIS — R079 Chest pain, unspecified: Secondary | ICD-10-CM

## 2023-11-08 DIAGNOSIS — Z87891 Personal history of nicotine dependence: Secondary | ICD-10-CM

## 2023-11-08 DIAGNOSIS — I25118 Atherosclerotic heart disease of native coronary artery with other forms of angina pectoris: Secondary | ICD-10-CM | POA: Diagnosis not present

## 2023-11-08 MED ORDER — CARVEDILOL 25 MG PO TABS: 25.0000 mg | ORAL_TABLET | Freq: Two times a day (BID) | ORAL | 3 refills | Status: DC

## 2023-11-08 MED ORDER — EZETIMIBE 10 MG PO TABS
10.0000 mg | ORAL_TABLET | Freq: Every day | ORAL | 3 refills | Status: DC
Start: 2023-11-08 — End: 2023-11-08

## 2023-11-08 MED ORDER — ROSUVASTATIN CALCIUM 5 MG PO TABS: 5.0000 mg | ORAL_TABLET | Freq: Every day | ORAL | 3 refills | Status: DC

## 2023-11-08 MED ORDER — EZETIMIBE 10 MG PO TABS: 10.0000 mg | ORAL_TABLET | Freq: Every day | ORAL | 3 refills | Status: DC

## 2023-11-08 MED ORDER — AMLODIPINE BESYLATE 10 MG PO TABS: 10.0000 mg | ORAL_TABLET | Freq: Every day | ORAL | 3 refills | Status: DC

## 2023-11-08 NOTE — Patient Instructions (Signed)
Monitor blood pressure ? ?Medication Instructions:  ?No changes ? ?If you need a refill on your cardiac medications before your next appointment, please call your pharmacy.  ? ?Lab work: ?No new labs needed ? ?Testing/Procedures: ?No new testing needed ? ?Follow-Up: ?At [facility], you and your health needs are our priority.  As part of our continuing mission to provide you with exceptional heart care, we have created designated Provider Care Teams.  These Care Teams include your primary Cardiologist (physician) and Advanced Practice Providers (APPs -  Physician Assistants and Nurse Practitioners) who all work together to provide you with the care you need, when you need it. ? ?You will need a follow up appointment in 12 months ? ?Providers on your designated Care Team:   ?Murray Hodgkins, NP ?Christell Faith, PA-C ?Cadence Kathlen Mody, PA-C ? ?COVID-19 Vaccine Information can be found at: ShippingScam.co.uk For questions related to vaccine distribution or appointments, please email [email or web address] or call [phone].  ? ?

## 2023-11-17 ENCOUNTER — Ambulatory Visit
Admission: RE | Admit: 2023-11-17 | Discharge: 2023-11-17 | Disposition: A | Discharge status: Home / Self Care | Payer: 59 | Source: Ambulatory Visit | Attending: Family Medicine | Admitting: Family Medicine

## 2023-11-17 DIAGNOSIS — M5416 Radiculopathy, lumbar region: Secondary | ICD-10-CM

## 2024-01-12 ENCOUNTER — Other Ambulatory Visit: Payer: Self-pay | Admitting: Family Medicine

## 2024-01-12 DIAGNOSIS — M545 Low back pain, unspecified: Secondary | ICD-10-CM

## 2024-01-12 DIAGNOSIS — M546 Pain in thoracic spine: Secondary | ICD-10-CM

## 2024-01-16 NOTE — Telephone Encounter (Signed)
 Requested medication (s) are due for refill today: Yes  Requested medication (s) are on the active medication list: Yes  Last refill:  08/26/23  Future visit scheduled: No  Notes to clinic:  Not delegated.    Requested Prescriptions  Pending Prescriptions Disp Refills   cyclobenzaprine  (FLEXERIL ) 10 MG tablet [Pharmacy Med Name: Cyclobenzaprine  HCl 10 MG Oral Tablet] 30 tablet 0    Sig: Take 1 tablet by mouth three times daily as needed for muscle spasm     Not Delegated - Analgesics:  Muscle Relaxants Failed - 01/16/2024 12:41 PM      Failed - This refill cannot be delegated      Failed - Valid encounter within last 6 months    Recent Outpatient Visits   None

## 2024-02-10 ENCOUNTER — Ambulatory Visit: Admitting: Family Medicine

## 2024-02-10 VITALS — BP 111/72 | HR 52 | Temp 97.9°F | Ht 69.0 in | Wt 190.6 lb

## 2024-02-10 DIAGNOSIS — E782 Mixed hyperlipidemia: Secondary | ICD-10-CM | POA: Diagnosis not present

## 2024-02-10 DIAGNOSIS — J0100 Acute maxillary sinusitis, unspecified: Secondary | ICD-10-CM

## 2024-02-10 DIAGNOSIS — I10 Essential (primary) hypertension: Secondary | ICD-10-CM | POA: Diagnosis not present

## 2024-02-10 MED ORDER — MELOXICAM 15 MG PO TABS: 15.0000 mg | ORAL_TABLET | Freq: Every day | ORAL | 1 refills | Status: DC

## 2024-02-10 MED ORDER — AMOXICILLIN-POT CLAVULANATE 875-125 MG PO TABS: 1.0000 | ORAL_TABLET | Freq: Two times a day (BID) | ORAL | 0 refills | Status: DC

## 2024-02-10 NOTE — Assessment & Plan Note (Signed)
 Under good control on current regimen. Continue current regimen. Continue to monitor. Call with any concerns. Refills through cardiology. Labs drawn today.

## 2024-02-10 NOTE — Progress Notes (Signed)
 BP 111/72 (BP Location: Left Arm, Patient Position: Sitting, Cuff Size: Large)   Pulse (!) 52   Temp 97.9 F (36.6 C) (Oral)   Ht 5\' 9"  (1.753 m)   Wt 190 lb 9.6 oz (86.5 kg)   SpO2 97%   BMI 28.15 kg/m    Subjective:    Patient ID: Jeff Fields, male    DOB: 02-Jan-1961, 63 y.o.   MRN: [id number]  HPI: Jeff Fields is a 63 y.o. male  Chief Complaint  Patient presents with   Sinusitis    Cough, congestion. Onset 2 weeks ago.    UPPER RESPIRATORY TRACT INFECTION Duration: about 2 weeks Worst symptom: cough, congestion Fever: yes Cough: yes Shortness of breath: no Wheezing: no Chest pain: no Chest tightness: no Chest congestion: yes Nasal congestion: yes Runny nose: yes Post nasal drip: yes Sneezing: yes Sore throat: yes Swollen glands: no Sinus pressure: no Headache: yes Face pain: no Toothache: no Ear pain: no  Ear pressure: no  Eyes red/itching:no Eye drainage/crusting: no  Vomiting: no Rash: no Fatigue: yes Sick contacts: no Strep contacts: no  Context: stable Recurrent sinusitis: no Relief with OTC cold/cough medications: no  Treatments attempted: cold/sinus and cough syrup   HYPERTENSION / HYPERLIPIDEMIA Satisfied with current treatment? yes Duration of hypertension: chronic BP monitoring frequency: not checking BP medication side effects: no Past BP meds: carvedilol , amlodipine  Duration of hyperlipidemia: chronic Cholesterol medication side effects: no Cholesterol supplements: none Past cholesterol medications: zetia , crestor  Medication compliance: excellent compliance Aspirin: yes Recent stressors: no Recurrent headaches: no Visual changes: no Palpitations: no Dyspnea: no Chest pain: no Lower extremity edema: no Dizzy/lightheaded: no  Relevant past medical, surgical, family and social history reviewed and updated as indicated. Interim medical history since our last visit reviewed. Allergies and medications reviewed and  updated.  Review of Systems  Constitutional: Negative.   HENT:  Positive for congestion, postnasal drip, rhinorrhea and sinus pressure. Negative for dental problem, drooling, ear discharge, ear pain, facial swelling, hearing loss, mouth sores, nosebleeds, sinus pain, sneezing, sore throat, tinnitus, trouble swallowing and voice change.   Respiratory:  Positive for cough. Negative for apnea, choking, chest tightness, shortness of breath, wheezing and stridor.   Cardiovascular: Negative.   Musculoskeletal: Negative.   Skin: Negative.   Psychiatric/Behavioral: Negative.      Per HPI unless specifically indicated above     Objective:     BP 111/72 (BP Location: Left Arm, Patient Position: Sitting, Cuff Size: Large)   Pulse (!) 52   Temp 97.9 F (36.6 C) (Oral)   Ht 5\' 9"  (1.753 m)   Wt 190 lb 9.6 oz (86.5 kg)   SpO2 97%   BMI 28.15 kg/m   Wt Readings from Last 3 Encounters:  02/10/24 190 lb 9.6 oz (86.5 kg)  11/08/23 194 lb 8 oz (88.2 kg)  05/05/23 193 lb 3.2 oz (87.6 kg)    Physical Exam Vitals and nursing note reviewed.  Constitutional:      General: He is not in acute distress.    Appearance: Normal appearance. He is not ill-appearing, toxic-appearing or diaphoretic.  HENT:     Head: Normocephalic and atraumatic.     Right Ear: Tympanic membrane, ear canal and external ear normal.     Left Ear: Tympanic membrane, ear canal and external ear normal.     Nose: Congestion and rhinorrhea present.     Mouth/Throat:     Mouth: Mucous membranes are moist.  Pharynx: Oropharynx is clear. No oropharyngeal exudate or posterior oropharyngeal erythema.  Eyes:     General: No scleral icterus.       Right eye: No discharge.        Left eye: No discharge.     Extraocular Movements: Extraocular movements intact.     Conjunctiva/sclera: Conjunctivae normal.     Pupils: Pupils are equal, round, and reactive to light.  Cardiovascular:     Rate and Rhythm: Normal rate and regular  rhythm.     Pulses: Normal pulses.     Heart sounds: Normal heart sounds. No murmur heard.    No friction rub. No gallop.  Pulmonary:     Effort: Pulmonary effort is normal. No respiratory distress.     Breath sounds: Normal breath sounds. No stridor. No wheezing, rhonchi or rales.  Chest:     Chest wall: No tenderness.  Musculoskeletal:        General: Normal range of motion.     Cervical back: Normal range of motion and neck supple.  Skin:    General: Skin is warm and dry.     Capillary Refill: Capillary refill takes less than 2 seconds.     Coloration: Skin is not jaundiced or pale.     Findings: No bruising, erythema, lesion or rash.  Neurological:     General: No focal deficit present.     Mental Status: He is alert and oriented to person, place, and time. Mental status is at baseline.  Psychiatric:        Mood and Affect: Mood normal.        Behavior: Behavior normal.        Thought Content: Thought content normal.        Judgment: Judgment normal.     Results for orders placed or performed in visit on 05/05/23  Comprehensive metabolic panel   Collection Time: 05/05/23  4:33 PM  Result Value Ref Range   Glucose 88 70 - 99 mg/dL   BUN 16 8 - 27 mg/dL   Creatinine, Ser 7.42 0.76 - 1.27 mg/dL   eGFR 72 >59 DG/LOV/5.64   BUN/Creatinine Ratio 14 10 - 24   Sodium 143 134 - 144 mmol/L   Potassium 4.4 3.5 - 5.2 mmol/L   Chloride 104 96 - 106 mmol/L   CO2 25 20 - 29 mmol/L   Calcium  9.6 8.6 - 10.2 mg/dL   Total Protein 6.8 6.0 - 8.5 g/dL   Albumin 4.5 3.9 - 4.9 g/dL   Globulin, Total 2.3 1.5 - 4.5 g/dL   Bilirubin Total 1.3 (H) 0.0 - 1.2 mg/dL   Alkaline Phosphatase 79 44 - 121 IU/L   AST 22 0 - 40 IU/L   ALT 22 0 - 44 IU/L  CBC with Differential/Platelet   Collection Time: 05/05/23  4:33 PM  Result Value Ref Range   WBC 6.2 3.4 - 10.8 x10E3/uL   RBC 4.47 4.14 - 5.80 x10E6/uL   Hemoglobin 14.7 13.0 - 17.7 g/dL   Hematocrit 33.2 95.1 - 51.0 %   MCV 93 79 - 97 fL    MCH 32.9 26.6 - 33.0 pg   MCHC 35.3 31.5 - 35.7 g/dL   RDW 88.4 16.6 - 06.3 %   Platelets 178 150 - 450 x10E3/uL   Neutrophils 57 Not Estab. %   Lymphs 29 Not Estab. %   Monocytes 9 Not Estab. %   Eos 4 Not Estab. %   Basos 1 Not Estab. %  Neutrophils Absolute 3.6 1.4 - 7.0 x10E3/uL   Lymphocytes Absolute 1.8 0.7 - 3.1 x10E3/uL   Monocytes Absolute 0.6 0.1 - 0.9 x10E3/uL   EOS (ABSOLUTE) 0.2 0.0 - 0.4 x10E3/uL   Basophils Absolute 0.0 0.0 - 0.2 x10E3/uL   Immature Granulocytes 0 Not Estab. %   Immature Grans (Abs) 0.0 0.0 - 0.1 x10E3/uL  Lipid Panel w/o Chol/HDL Ratio   Collection Time: 05/05/23  4:33 PM  Result Value Ref Range   Cholesterol, Total 101 100 - 199 mg/dL   Triglycerides 161 0 - 149 mg/dL   HDL 43 >09 mg/dL   VLDL Cholesterol Cal 20 5 - 40 mg/dL   LDL Chol Calc (NIH) 38 0 - 99 mg/dL  PSA   Collection Time: 05/05/23  4:33 PM  Result Value Ref Range   Prostate Specific Ag, Serum 1.2 0.0 - 4.0 ng/mL  TSH   Collection Time: 05/05/23  4:33 PM  Result Value Ref Range   TSH 1.790 0.450 - 4.500 uIU/mL  HIV Antibody (routine testing w rflx)   Collection Time: 05/05/23  4:33 PM  Result Value Ref Range   HIV Screen 4th Generation wRfx Non Reactive Non Reactive  Hepatitis C Antibody   Collection Time: 05/05/23  4:33 PM  Result Value Ref Range   Hep C Virus Ab Non Reactive Non Reactive      Assessment & Plan:   Problem List Items Addressed This Visit       Cardiovascular and Mediastinum   Benign hypertension   Under good control on current regimen. Continue current regimen. Continue to monitor. Call with any concerns. Refills through cardiology. Labs drawn today.        Relevant Orders   Comprehensive metabolic panel with GFR   CBC with Differential/Platelet     Other   Hyperlipidemia   Under good control on current regimen. Continue current regimen. Continue to monitor. Call with any concerns. Refills through cardiology. Labs drawn today.          Relevant Orders   Comprehensive metabolic panel with GFR   Lipid Panel w/o Chol/HDL Ratio   CBC with Differential/Platelet   Other Visit Diagnoses       Acute non-recurrent maxillary sinusitis    -  Primary   Will treat with augmentin . Call if not getting better or getting worse. Continue to monitor.   Relevant Medications   amoxicillin -clavulanate (AUGMENTIN ) 875-125 MG tablet        Follow up plan: Return in about 6 months (around 08/12/2024) for physical.

## 2024-02-11 LAB — LIPID PANEL W/O CHOL/HDL RATIO
Cholesterol, Total: 95 mg/dL — ABNORMAL LOW (ref 100–199)
HDL: 40 mg/dL (ref >39)
LDL Chol Calc (NIH): 36 mg/dL (ref 0–99)
Triglycerides: 100 mg/dL (ref 0–149)
VLDL Cholesterol Cal: 19 mg/dL (ref 5–40)

## 2024-02-11 LAB — CBC WITH DIFFERENTIAL/PLATELET
Basophils Absolute: 0 10*3/uL (ref 0.0–0.2)
Basos: 1 %
EOS (ABSOLUTE): 0.2 10*3/uL (ref 0.0–0.4)
Eos: 4 %
Hematocrit: 41.1 % (ref 37.5–51.0)
Hemoglobin: 14.1 g/dL (ref 13.0–17.7)
Immature Grans (Abs): 0 10*3/uL (ref 0.0–0.1)
Immature Granulocytes: 0 %
Lymphocytes Absolute: 1.6 10*3/uL (ref 0.7–3.1)
Lymphs: 30 %
MCH: 33.2 pg — ABNORMAL HIGH (ref 26.6–33.0)
MCHC: 34.3 g/dL (ref 31.5–35.7)
MCV: 97 fL (ref 79–97)
Monocytes Absolute: 0.6 10*3/uL (ref 0.1–0.9)
Monocytes: 11 %
Neutrophils Absolute: 3 10*3/uL (ref 1.4–7.0)
Neutrophils: 54 %
Platelets: 211 10*3/uL (ref 150–450)
RBC: 4.25 x10E6/uL (ref 4.14–5.80)
RDW: 12.4 % (ref 11.6–15.4)
WBC: 5.5 10*3/uL (ref 3.4–10.8)

## 2024-02-11 LAB — COMPREHENSIVE METABOLIC PANEL WITH GFR
ALT: 24 IU/L (ref 0–44)
AST: 26 IU/L (ref 0–40)
Albumin: 4.5 g/dL (ref 3.9–4.9)
Alkaline Phosphatase: 88 IU/L (ref 44–121)
BUN/Creatinine Ratio: 16 (ref 10–24)
BUN: 17 mg/dL (ref 8–27)
Bilirubin Total: 0.9 mg/dL (ref 0.0–1.2)
CO2: 21 mmol/L (ref 20–29)
Calcium: 9 mg/dL (ref 8.6–10.2)
Chloride: 105 mmol/L (ref 96–106)
Creatinine, Ser: 1.09 mg/dL (ref 0.76–1.27)
Globulin, Total: 2.2 g/dL (ref 1.5–4.5)
Glucose: 89 mg/dL (ref 70–99)
Potassium: 4.7 mmol/L (ref 3.5–5.2)
Sodium: 140 mmol/L (ref 134–144)
Total Protein: 6.7 g/dL (ref 6.0–8.5)
eGFR: 77 mL/min/{1.73_m2} (ref >59)

## 2024-02-20 ENCOUNTER — Ambulatory Visit: Payer: Self-pay | Admitting: Family Medicine

## 2024-05-13 ENCOUNTER — Other Ambulatory Visit: Payer: Self-pay | Admitting: Nurse Practitioner

## 2024-05-13 DIAGNOSIS — M546 Pain in thoracic spine: Secondary | ICD-10-CM

## 2024-05-13 DIAGNOSIS — M545 Low back pain, unspecified: Secondary | ICD-10-CM

## 2024-05-15 NOTE — Telephone Encounter (Signed)
 Requested medication (s) are due for refill today: yes  Requested medication (s) are on the active medication list: yes  Last refill:  01/16/24 #30  Future visit scheduled: yes  Notes to clinic:  med not delegated to NT to RF   Requested Prescriptions  Pending Prescriptions Disp Refills   cyclobenzaprine  (FLEXERIL ) 10 MG tablet [Pharmacy Med Name: Cyclobenzaprine  HCl 10 MG Oral Tablet] 30 tablet 0    Sig: Take 1 tablet by mouth three times daily as needed for muscle spasm     Not Delegated - Analgesics:  Muscle Relaxants Failed - 05/15/2024  1:32 PM      Failed - This refill cannot be delegated      Failed - Valid encounter within last 6 months    Recent Outpatient Visits           3 months ago Acute non-recurrent maxillary sinusitis   [facility] Vicci Duwaine SQUIBB, DO       Future Appointments             In 3 months Vicci, Duwaine SQUIBB, DO [place] [facility], [address]

## 2024-06-04 ENCOUNTER — Telehealth: Payer: Self-pay | Admitting: Family Medicine

## 2024-06-04 NOTE — Telephone Encounter (Signed)
 Patient dropped off Physician Wellness Screening Form to be filled out by provider. Patient is requesting a call back at [phone] within 2-5 days when completed. Document is located in providers folder.

## 2024-06-06 NOTE — Telephone Encounter (Signed)
 Filled out, back in folder. Awaiting provider signature

## 2024-06-07 ENCOUNTER — Telehealth: Payer: Self-pay | Admitting: Family Medicine

## 2024-06-07 NOTE — Telephone Encounter (Signed)
 Copied from CRM [id number]. Topic: General - Other >> Jun 07, 2024  9:42 AM Gustabo D wrote: Pt dropped off papers on Monday and is calling today saying he needs it today. He will call back later to check.

## 2024-06-08 NOTE — Telephone Encounter (Signed)
 Completed. Sent message to patient

## 2024-06-08 NOTE — Telephone Encounter (Signed)
Pt picked up form today

## 2024-07-23 ENCOUNTER — Other Ambulatory Visit: Payer: Self-pay | Admitting: Family Medicine

## 2024-07-23 DIAGNOSIS — M545 Low back pain, unspecified: Secondary | ICD-10-CM

## 2024-07-23 DIAGNOSIS — M546 Pain in thoracic spine: Secondary | ICD-10-CM

## 2024-07-25 NOTE — Telephone Encounter (Signed)
 Requested medication (s) are due for refill today: yes  Requested medication (s) are on the active medication list: yes  Last refill:  05/20/24  Future visit scheduled: yes  Notes to clinic:  Unable to refill per protocol, cannot delegate.      Requested Prescriptions  Pending Prescriptions Disp Refills   cyclobenzaprine  (FLEXERIL ) 10 MG tablet [Pharmacy Med Name: Cyclobenzaprine  HCl 10 MG Oral Tablet] 30 tablet 0    Sig: Take 1 tablet by mouth three times daily as needed for muscle spasm     Not Delegated - Analgesics:  Muscle Relaxants Failed - 07/25/2024 11:49 AM      Failed - This refill cannot be delegated      Failed - Valid encounter within last 6 months    Recent Outpatient Visits           5 months ago Acute non-recurrent maxillary sinusitis   [facility] [place], Duwaine SQUIBB, DO       Future Appointments             In 2 weeks Vicci, Duwaine SQUIBB, DO [place] [facility], [address]

## 2024-08-14 ENCOUNTER — Encounter: Admitting: Family Medicine

## 2024-10-15 ENCOUNTER — Encounter: Admitting: Family Medicine

## 2024-10-17 ENCOUNTER — Ambulatory Visit: Admitting: Family Medicine

## 2024-10-17 ENCOUNTER — Encounter: Payer: Self-pay | Admitting: Family Medicine

## 2024-10-17 VITALS — BP 138/86 | HR 62 | Temp 97.4°F | Resp 17 | Ht 69.02 in | Wt 199.4 lb

## 2024-10-17 DIAGNOSIS — Z23 Encounter for immunization: Secondary | ICD-10-CM

## 2024-10-17 DIAGNOSIS — E782 Mixed hyperlipidemia: Secondary | ICD-10-CM

## 2024-10-17 DIAGNOSIS — Z0000 Encounter for general adult medical examination without abnormal findings: Secondary | ICD-10-CM

## 2024-10-17 DIAGNOSIS — I25118 Atherosclerotic heart disease of native coronary artery with other forms of angina pectoris: Secondary | ICD-10-CM

## 2024-10-17 DIAGNOSIS — I10 Essential (primary) hypertension: Secondary | ICD-10-CM

## 2024-10-17 LAB — MICROALBUMIN, URINE WAIVED
Creatinine, Urine Waived: 50 mg/dL (ref 10–300)
Microalb, Ur Waived: 10 mg/L (ref 0–19)
Microalb/Creat Ratio: <30 mg/g

## 2024-10-17 LAB — BAYER DCA HB A1C WAIVED: HB A1C (BAYER DCA - WAIVED): 5.2 % (ref 4.8–5.6)

## 2024-10-17 MED ORDER — MELOXICAM 15 MG PO TABS: 15.0000 mg | ORAL_TABLET | Freq: Every day | ORAL | 1 refills | Status: AC

## 2024-10-17 MED ORDER — ROSUVASTATIN CALCIUM 5 MG PO TABS: 5.0000 mg | ORAL_TABLET | Freq: Every day | ORAL | 3 refills | Status: AC

## 2024-10-17 MED ORDER — AMLODIPINE BESYLATE 10 MG PO TABS: 10.0000 mg | ORAL_TABLET | Freq: Every day | ORAL | 0 refills | Status: AC

## 2024-10-17 MED ORDER — CARVEDILOL 25 MG PO TABS: 25.0000 mg | ORAL_TABLET | Freq: Two times a day (BID) | ORAL | 1 refills | Status: AC

## 2024-10-17 MED ORDER — EZETIMIBE 10 MG PO TABS: 10.0000 mg | ORAL_TABLET | Freq: Every day | ORAL | 1 refills | Status: AC

## 2024-10-17 NOTE — Progress Notes (Unsigned)
 "  BP 138/86   Pulse 62   Temp (!) 97.4 F (36.3 C) (Oral)   Resp 17   Ht 5' 9.02 (1.753 m)   Wt 199 lb 6.4 oz (90.4 kg)   SpO2 98%   BMI 29.43 kg/m    Subjective:    Patient ID: Jeff Fields, male    DOB: 06/10/1961, 64 y.o.   MRN: [id number]  HPI: Jeff Fields is a 64 y.o. male presenting on 10/17/2024 for comprehensive medical examination. Current medical complaints include  HYPERTENSION / HYPERLIPIDEMIA Satisfied with current treatment? yes Duration of hypertension: chronic BP monitoring frequency: rarely BP medication side effects: yes- swelling on whole amlodipine , has been taking 1/2 Past BP meds: amlodipine , carvedilol  Duration of hyperlipidemia: chronic Cholesterol medication side effects: no Cholesterol supplements: none Past cholesterol medications: zetia , crestor  Medication compliance: excellent compliance Aspirin: yes Recent stressors: no Recurrent headaches: no Visual changes: no Palpitations: no Dyspnea: no Chest pain: no Lower extremity edema: yes Dizzy/lightheaded: no  He currently lives with: wife Interim Problems from his last visit: no  Depression Screen done today and results listed below:     10/18/2024   11:56 AM 02/10/2024   11:24 AM 05/05/2023    4:08 PM 02/25/2023    3:46 PM 09/17/2022    3:08 PM  Depression screen PHQ 2/9  Decreased Interest 0 0 0 0 0  Down, Depressed, Hopeless 0 0 0 0 0  PHQ - 2 Score 0 0 0 0 0  Altered sleeping  0 1 0 0  Tired, decreased energy  1 1 0 2  Change in appetite  0 0 0 0  Feeling bad or failure about yourself   0 0 0 0  Trouble concentrating  0 0 0 0  Moving slowly or fidgety/restless  0 0 0 0  Suicidal thoughts  0 0 0 0  PHQ-9 Score  1  2  0  2   Difficult doing work/chores  Not difficult at all Not difficult at all Not difficult at all Not difficult at all     Data saved with a previous flowsheet row definition     Past Medical History:  Past Medical History:  Diagnosis Date   Coronary artery disease     a. LHC 10/2014: mid LAD 99% stenosis s/p PCI/DES, 40% stenosis in the proximal LAD, D1 40% stenosis at the ostium of the vessel, EF > 55%   GERD (gastroesophageal reflux disease)    Hyperlipidemia    Hypertension     Surgical History:  Past Surgical History:  Procedure Laterality Date   CARDIAC CATHETERIZATION  11/08/2014   COLONOSCOPY WITH PROPOFOL  N/A 10/09/2021   Procedure: COLONOSCOPY WITH PROPOFOL ;  Surgeon: Therisa Bi, MD;  Location: [facility];  Service: Gastroenterology;  Laterality: N/A;   CORONARY ANGIOPLASTY WITH STENT PLACEMENT  11/08/2014   drug eluting stent placement to the mid LAD   VASECTOMY  09/13/1998    Medications:  Current Outpatient Medications on File Prior to Visit  Medication Sig   aspirin 81 MG tablet Take 81 mg by mouth daily.   Coenzyme Q10 (COQ-10) 200 MG CAPS Take 200 mg by mouth daily.   cyanocobalamin 1000 MCG tablet Take 1,000 mcg by mouth daily.   cyclobenzaprine  (FLEXERIL ) 10 MG tablet Take 1 tablet by mouth three times daily as needed for muscle spasm   fluticasone  (FLONASE ) 50 MCG/ACT nasal spray Place 2 sprays into both nostrils daily.   No current facility-administered medications on file  prior to visit.    Allergies:  Allergies[1]  Social History:  Social History   Socioeconomic History   Marital status: Married    Spouse name: Not on file   Number of children: Not on file   Years of education: Not on file   Highest education level: 12th grade  Occupational History   Not on file  Tobacco Use   Smoking status: Former    Current packs/day: 0.00    Average packs/day: 1.0 packs/day    Types: Cigarettes    Quit date: 09/13/1988    Years since quitting: 36.1   Smokeless tobacco: Never  Vaping Use   Vaping status: Never Used  Substance and Sexual Activity   Alcohol use: Yes    Alcohol/week: 0.0 standard drinks of alcohol    Comment: Rarely   Drug use: No   Sexual activity: Yes  Other Topics Concern   Not on file   Social History Narrative   Not on file   Social Drivers of Health   Tobacco Use: Medium Risk (10/17/2024)   Patient History    Smoking Tobacco Use: Former    Smokeless Tobacco Use: Never    Passive Exposure: Not on file  Financial Resource Strain: Low Risk (02/10/2024)   Overall Financial Resource Strain (CARDIA)    Difficulty of Paying Living Expenses: Not hard at all  Food Insecurity: No Food Insecurity (02/10/2024)   Hunger Vital Sign    Worried About Running Out of Food in the Last Year: Never true    Ran Out of Food in the Last Year: Never true  Transportation Needs: No Transportation Needs (02/10/2024)   PRAPARE - Administrator, Civil Service (Medical): No    Lack of Transportation (Non-Medical): No  Physical Activity: Unknown (02/10/2024)   Exercise Vital Sign    Days of Exercise per Week: 0 days    Minutes of Exercise per Session: Not on file  Stress: No Stress Concern Present (02/10/2024)   [employer] of Occupational Health - Occupational Stress Questionnaire    Feeling of Stress : Only a little  Social Connections: Unknown (02/10/2024)   Social Connection and Isolation Panel    Frequency of Communication with Friends and Family: More than three times a week    Frequency of Social Gatherings with Friends and Family: Once a week    Attends Religious Services: Patient declined    Database Administrator or Organizations: Patient declined    Attends Banker Meetings: Not on file    Marital Status: Married  Intimate Partner Violence: Not on file  Depression (PHQ2-9): Low Risk (10/18/2024)   Depression (PHQ2-9)    PHQ-2 Score: 0  Alcohol Screen: Low Risk (02/10/2024)   Alcohol Screen    Last Alcohol Screening Score (AUDIT): 1  Housing: Unknown (03/05/2024)   Received from [facility]   Epic    Unable to Pay for Housing in the Last Year: Not on file    Number of Times Moved in the Last Year: Not on file    At any time in the  past 12 months, were you homeless or living in a shelter (including now)?: No  Utilities: Not on file  Health Literacy: Not on file   Tobacco Use History[2] Social History   Substance and Sexual Activity  Alcohol Use Yes   Alcohol/week: 0.0 standard drinks of alcohol   Comment: Rarely    Family History:  Family History  Problem Relation Age  of Onset   Hypertension Mother    Hypertension Father    Arrhythmia Father        Pacemaker implant   Hypertension Brother    Arthritis Brother    Stroke Maternal Grandmother    Lung cancer Maternal Grandfather     Past medical history, surgical history, medications, allergies, family history and social history reviewed with patient today and changes made to appropriate areas of the chart.   Review of Systems  Constitutional: Negative.   HENT: Negative.    Eyes:  Positive for blurred vision. Negative for double vision, photophobia, pain, discharge and redness.  Respiratory: Negative.    Cardiovascular:  Positive for leg swelling. Negative for chest pain, palpitations, orthopnea, claudication and PND.  Gastrointestinal: Negative.   Genitourinary: Negative.   Musculoskeletal: Negative.   Skin: Negative.   Neurological:  Positive for tingling. Negative for dizziness, tremors, sensory change, speech change, focal weakness, seizures, loss of consciousness, weakness and headaches.  Endo/Heme/Allergies:  Negative for environmental allergies and polydipsia. Bruises/bleeds easily.  Psychiatric/Behavioral: Negative.     All other ROS negative except what is listed above and in the HPI.      Objective:    BP 138/86   Pulse 62   Temp (!) 97.4 F (36.3 C) (Oral)   Resp 17   Ht 5' 9.02 (1.753 m)   Wt 199 lb 6.4 oz (90.4 kg)   SpO2 98%   BMI 29.43 kg/m   Wt Readings from Last 3 Encounters:  10/17/24 199 lb 6.4 oz (90.4 kg)  02/10/24 190 lb 9.6 oz (86.5 kg)  11/08/23 194 lb 8 oz (88.2 kg)    Physical Exam Vitals and nursing note  reviewed.  Constitutional:      General: He is not in acute distress.    Appearance: Normal appearance. He is obese. He is not ill-appearing, toxic-appearing or diaphoretic.  HENT:     Head: Normocephalic and atraumatic.     Right Ear: Tympanic membrane, ear canal and external ear normal. There is no impacted cerumen.     Left Ear: Tympanic membrane, ear canal and external ear normal. There is no impacted cerumen.     Nose: Nose normal. No congestion or rhinorrhea.     Mouth/Throat:     Mouth: Mucous membranes are moist.     Pharynx: Oropharynx is clear. No oropharyngeal exudate or posterior oropharyngeal erythema.  Eyes:     General: No scleral icterus.       Right eye: No discharge.        Left eye: No discharge.     Extraocular Movements: Extraocular movements intact.     Conjunctiva/sclera: Conjunctivae normal.     Pupils: Pupils are equal, round, and reactive to light.  Neck:     Vascular: No carotid bruit.  Cardiovascular:     Rate and Rhythm: Normal rate and regular rhythm.     Pulses: Normal pulses.     Heart sounds: No murmur heard.    No friction rub. No gallop.  Pulmonary:     Effort: Pulmonary effort is normal. No respiratory distress.     Breath sounds: Normal breath sounds. No stridor. No wheezing, rhonchi or rales.  Chest:     Chest wall: No tenderness.  Abdominal:     General: Abdomen is flat. Bowel sounds are normal. There is no distension.     Palpations: Abdomen is soft. There is no mass.     Tenderness: There is no abdominal tenderness. There  is no right CVA tenderness, left CVA tenderness, guarding or rebound.     Hernia: No hernia is present.  Genitourinary:    Comments: Genital exam deferred with shared decision making Musculoskeletal:        General: No swelling, tenderness, deformity or signs of injury.     Cervical back: Normal range of motion and neck supple. No rigidity. No muscular tenderness.     Right lower leg: No edema.     Left lower leg: No  edema.  Lymphadenopathy:     Cervical: No cervical adenopathy.  Skin:    General: Skin is warm and dry.     Capillary Refill: Capillary refill takes less than 2 seconds.     Coloration: Skin is not jaundiced or pale.     Findings: No bruising, erythema, lesion or rash.  Neurological:     General: No focal deficit present.     Mental Status: He is alert and oriented to person, place, and time.     Cranial Nerves: No cranial nerve deficit.     Sensory: No sensory deficit.     Motor: No weakness.     Coordination: Coordination normal.     Gait: Gait normal.     Deep Tendon Reflexes: Reflexes normal.  Psychiatric:        Mood and Affect: Mood normal.        Behavior: Behavior normal.        Thought Content: Thought content normal.        Judgment: Judgment normal.     Results for orders placed or performed in visit on 10/17/24  Microalbumin, Urine Waived   Collection Time: 10/17/24  3:53 PM  Result Value Ref Range   Microalb, Ur Waived 10 0 - 19 mg/L   Creatinine, Urine Waived 50 10 - 300 mg/dL   Microalb/Creat Ratio <30 <30 mg/g  Bayer DCA Hb A1c Waived   Collection Time: 10/17/24  3:53 PM  Result Value Ref Range   HB A1C (BAYER DCA - WAIVED) 5.2 4.8 - 5.6 %  Comprehensive metabolic panel with GFR   Collection Time: 10/17/24  3:54 PM  Result Value Ref Range   Glucose 86 70 - 99 mg/dL   BUN 16 8 - 27 mg/dL   Creatinine, Ser 8.83 0.76 - 1.27 mg/dL   eGFR 71 >40 fO/fpw/8.26   BUN/Creatinine Ratio 14 10 - 24   Sodium 141 134 - 144 mmol/L   Potassium 4.1 3.5 - 5.2 mmol/L   Chloride 106 96 - 106 mmol/L   CO2 19 (L) 20 - 29 mmol/L   Calcium  9.6 8.6 - 10.2 mg/dL   Total Protein 6.5 6.0 - 8.5 g/dL   Albumin 4.5 3.9 - 4.9 g/dL   Globulin, Total 2.0 1.5 - 4.5 g/dL   Bilirubin Total 0.9 0.0 - 1.2 mg/dL   Alkaline Phosphatase 76 47 - 123 IU/L   AST 22 0 - 40 IU/L   ALT 22 0 - 44 IU/L  CBC with Differential/Platelet   Collection Time: 10/17/24  3:54 PM  Result Value Ref Range    WBC 6.4 3.4 - 10.8 x10E3/uL   RBC 4.58 4.14 - 5.80 x10E6/uL   Hemoglobin 14.5 13.0 - 17.7 g/dL   Hematocrit 57.5 62.4 - 51.0 %   MCV 93 79 - 97 fL   MCH 31.7 26.6 - 33.0 pg   MCHC 34.2 31.5 - 35.7 g/dL   RDW 87.9 88.3 - 84.5 %   Platelets 188 150 -  450 x10E3/uL   Neutrophils 58 Not Estab. %   Lymphs 28 Not Estab. %   Monocytes 9 Not Estab. %   Eos 4 Not Estab. %   Basos 1 Not Estab. %   Neutrophils Absolute 3.7 1.4 - 7.0 x10E3/uL   Lymphocytes Absolute 1.8 0.7 - 3.1 x10E3/uL   Monocytes Absolute 0.6 0.1 - 0.9 x10E3/uL   EOS (ABSOLUTE) 0.3 0.0 - 0.4 x10E3/uL   Basophils Absolute 0.0 0.0 - 0.2 x10E3/uL   Immature Granulocytes 0 Not Estab. %   Immature Grans (Abs) 0.0 0.0 - 0.1 x10E3/uL  Lipid Panel w/o Chol/HDL Ratio   Collection Time: 10/17/24  3:54 PM  Result Value Ref Range   Cholesterol, Total 135 100 - 199 mg/dL   Triglycerides 847 (H) 0 - 149 mg/dL   HDL 43 >60 mg/dL   VLDL Cholesterol Cal 26 5 - 40 mg/dL   LDL Chol Calc (NIH) 66 0 - 99 mg/dL  PSA   Collection Time: 10/17/24  3:54 PM  Result Value Ref Range   Prostate Specific Ag, Serum 1.3 0.0 - 4.0 ng/mL  TSH   Collection Time: 10/17/24  3:54 PM  Result Value Ref Range   TSH 2.230 0.450 - 4.500 uIU/mL      Assessment & Plan:   Problem List Items Addressed This Visit       Cardiovascular and Mediastinum   CAD (coronary artery disease)   Will keep BP and cholesterol under good control. Continue to follow with cardiology. Call with any concerns.       Relevant Medications   amLODipine  (NORVASC ) 10 MG tablet   carvedilol  (COREG ) 25 MG tablet   ezetimibe  (ZETIA ) 10 MG tablet   rosuvastatin  (CRESTOR ) 5 MG tablet   Benign hypertension   Running a little high- but having swelling on full dose amlodipine , will go back up and recheck in 6 weeks, may need to add ACE/ARB and cut down on amlodipine .       Relevant Medications   amLODipine  (NORVASC ) 10 MG tablet   carvedilol  (COREG ) 25 MG tablet   ezetimibe   (ZETIA ) 10 MG tablet   rosuvastatin  (CRESTOR ) 5 MG tablet   Other Relevant Orders   Microalbumin, Urine Waived (Completed)     Other   Hyperlipidemia   Under good control on current regimen. Continue current regimen. Continue to monitor. Call with any concerns. Refills given. Labs drawn today.        Relevant Medications   amLODipine  (NORVASC ) 10 MG tablet   carvedilol  (COREG ) 25 MG tablet   ezetimibe  (ZETIA ) 10 MG tablet   rosuvastatin  (CRESTOR ) 5 MG tablet   Other Relevant Orders   Lipid Panel w/o Chol/HDL Ratio (Completed)   Other Visit Diagnoses       Routine general medical examination at a health care facility    -  Primary   Vaccines up to date. Screening labs checked today. Colonoscopy up to date. Continue diet and exercise.   Relevant Orders   Comprehensive metabolic panel with GFR (Completed)   CBC with Differential/Platelet (Completed)   Lipid Panel w/o Chol/HDL Ratio (Completed)   PSA (Completed)   TSH (Completed)   Microalbumin, Urine Waived (Completed)   Bayer DCA Hb A1c Waived (Completed)     Need for diphtheria-tetanus-pertussis (Tdap) vaccine       Tdap given today.   Relevant Orders   Tdap vaccine greater than or equal to 63yo IM (Completed)        LABORATORY TESTING:  Health maintenance labs ordered today as discussed above.   The natural history of prostate cancer and ongoing controversy regarding screening and potential treatment outcomes of prostate cancer has been discussed with the patient. The meaning of a false positive PSA and a false negative PSA has been discussed. He indicates understanding of the limitations of this screening test and wishes to proceed with screening PSA testing.   IMMUNIZATIONS:   - Tdap: Tetanus vaccination status reviewed: Tdap vaccination indicated and given today. - Influenza: Refused - Pneumovax: Refused - Prevnar: Refused - COVID: Refused - HPV: Not applicable - Shingrix vaccine: Up to date  SCREENING: -  Colonoscopy: Up to date  Discussed with patient purpose of the colonoscopy is to detect colon cancer at curable precancerous or early stages   PATIENT COUNSELING:    Sexuality: Discussed sexually transmitted diseases, partner selection, use of condoms, avoidance of unintended pregnancy  and contraceptive alternatives.   Advised to avoid cigarette smoking.  I discussed with the patient that most people either abstain from alcohol or drink within safe limits (<=14/week and <=4 drinks/occasion for males, <=7/weeks and <= 3 drinks/occasion for females) and that the risk for alcohol disorders and other health effects rises proportionally with the number of drinks per week and how often a drinker exceeds daily limits.  Discussed cessation/primary prevention of drug use and availability of treatment for abuse.   Diet: Encouraged to adjust caloric intake to maintain  or achieve ideal body weight, to reduce intake of dietary saturated fat and total fat, to limit sodium intake by avoiding high sodium foods and not adding table salt, and to maintain adequate dietary potassium and calcium  preferably from fresh fruits, vegetables, and low-fat dairy products.    stressed the importance of regular exercise  Injury prevention: Discussed safety belts, safety helmets, smoke detector, smoking near bedding or upholstery.   Dental health: Discussed importance of regular tooth brushing, flossing, and dental visits.   Follow up plan: NEXT PREVENTATIVE PHYSICAL DUE IN 1 YEAR. Return in about 6 weeks (around 11/28/2024) for BP follow up.     [1]  Allergies Allergen Reactions   Hydrocodone  Itching   Tramadol Itching  [2]  Social History Tobacco Use  Smoking Status Former   Current packs/day: 0.00   Average packs/day: 1.0 packs/day   Types: Cigarettes   Quit date: 09/13/1988   Years since quitting: 36.1  Smokeless Tobacco Never   "

## 2024-10-18 ENCOUNTER — Ambulatory Visit: Payer: Self-pay | Admitting: Family Medicine

## 2024-10-18 LAB — CBC WITH DIFFERENTIAL/PLATELET
Basophils Absolute: 0 10*3/uL (ref 0.0–0.2)
Basos: 1 %
EOS (ABSOLUTE): 0.3 10*3/uL (ref 0.0–0.4)
Eos: 4 %
Hematocrit: 42.4 % (ref 37.5–51.0)
Hemoglobin: 14.5 g/dL (ref 13.0–17.7)
Immature Grans (Abs): 0 10*3/uL (ref 0.0–0.1)
Immature Granulocytes: 0 %
Lymphocytes Absolute: 1.8 10*3/uL (ref 0.7–3.1)
Lymphs: 28 %
MCH: 31.7 pg (ref 26.6–33.0)
MCHC: 34.2 g/dL (ref 31.5–35.7)
MCV: 93 fL (ref 79–97)
Monocytes Absolute: 0.6 10*3/uL (ref 0.1–0.9)
Monocytes: 9 %
Neutrophils Absolute: 3.7 10*3/uL (ref 1.4–7.0)
Neutrophils: 58 %
Platelets: 188 10*3/uL (ref 150–450)
RBC: 4.58 x10E6/uL (ref 4.14–5.80)
RDW: 12 % (ref 11.6–15.4)
WBC: 6.4 10*3/uL (ref 3.4–10.8)

## 2024-10-18 LAB — COMPREHENSIVE METABOLIC PANEL WITH GFR
ALT: 22 [IU]/L (ref 0–44)
AST: 22 [IU]/L (ref 0–40)
Albumin: 4.5 g/dL (ref 3.9–4.9)
Alkaline Phosphatase: 76 [IU]/L (ref 47–123)
BUN/Creatinine Ratio: 14 (ref 10–24)
BUN: 16 mg/dL (ref 8–27)
Bilirubin Total: 0.9 mg/dL (ref 0.0–1.2)
CO2: 19 mmol/L — ABNORMAL LOW (ref 20–29)
Calcium: 9.6 mg/dL (ref 8.6–10.2)
Chloride: 106 mmol/L (ref 96–106)
Creatinine, Ser: 1.16 mg/dL (ref 0.76–1.27)
Globulin, Total: 2 g/dL (ref 1.5–4.5)
Glucose: 86 mg/dL (ref 70–99)
Potassium: 4.1 mmol/L (ref 3.5–5.2)
Sodium: 141 mmol/L (ref 134–144)
Total Protein: 6.5 g/dL (ref 6.0–8.5)
eGFR: 71 mL/min/{1.73_m2}

## 2024-10-18 LAB — LIPID PANEL W/O CHOL/HDL RATIO
Cholesterol, Total: 135 mg/dL (ref 100–199)
HDL: 43 mg/dL
LDL Chol Calc (NIH): 66 mg/dL (ref 0–99)
Triglycerides: 152 mg/dL — ABNORMAL HIGH (ref 0–149)
VLDL Cholesterol Cal: 26 mg/dL (ref 5–40)

## 2024-10-18 LAB — PSA: Prostate Specific Ag, Serum: 1.3 ng/mL (ref 0.0–4.0)

## 2024-10-18 LAB — TSH: TSH: 2.23 u[IU]/mL (ref 0.450–4.500)

## 2024-10-18 NOTE — Assessment & Plan Note (Signed)
Will keep BP and cholesterol under good control. Continue to follow with cardiology. Call with any concerns.  

## 2024-10-18 NOTE — Assessment & Plan Note (Addendum)
 Running a little high- but having swelling on full dose amlodipine , will go back up and recheck in 6 weeks, may need to add ACE/ARB and cut down on amlodipine .

## 2024-10-18 NOTE — Assessment & Plan Note (Signed)
 Under good control on current regimen. Continue current regimen. Continue to monitor. Call with any concerns. Refills given. Labs drawn today.

## 2024-11-29 ENCOUNTER — Ambulatory Visit: Admitting: Family Medicine
